# Patient Record
Sex: Female | Born: 1985 | Race: White | Hispanic: No | Marital: Single | Smoking: Former smoker
Health system: Southern US, Community
[De-identification: ages and names within clinical notes are randomized; demographics above are authoritative.]

## PROBLEM LIST (undated history)

## (undated) DIAGNOSIS — F988 Other specified behavioral and emotional disorders with onset usually occurring in childhood and adolescence: Secondary | ICD-10-CM

## (undated) DIAGNOSIS — G43909 Migraine, unspecified, not intractable, without status migrainosus: Secondary | ICD-10-CM

## (undated) DIAGNOSIS — J309 Allergic rhinitis, unspecified: Secondary | ICD-10-CM

## (undated) DIAGNOSIS — Z87442 Personal history of urinary calculi: Secondary | ICD-10-CM

## (undated) DIAGNOSIS — G4700 Insomnia, unspecified: Secondary | ICD-10-CM

## (undated) DIAGNOSIS — I82409 Acute embolism and thrombosis of unspecified deep veins of unspecified lower extremity: Secondary | ICD-10-CM

## (undated) DIAGNOSIS — F3189 Other bipolar disorder: Secondary | ICD-10-CM

## (undated) HISTORY — DX: Allergic rhinitis, unspecified: J30.9

## (undated) HISTORY — PX: WISDOM TOOTH EXTRACTION: SHX21

## (undated) HISTORY — DX: Insomnia, unspecified: G47.00

## (undated) HISTORY — DX: Migraine, unspecified, not intractable, without status migrainosus: G43.909

## (undated) HISTORY — PX: TYMPANOSTOMY TUBE PLACEMENT: SHX32

## (undated) HISTORY — DX: Acute embolism and thrombosis of unspecified deep veins of unspecified lower extremity: I82.409

## (undated) HISTORY — PX: CHOLECYSTECTOMY: SHX55

## (undated) HISTORY — DX: Other specified behavioral and emotional disorders with onset usually occurring in childhood and adolescence: F98.8

## (undated) HISTORY — DX: Other bipolar disorder: F31.89

## (undated) HISTORY — PX: FRENULECTOMY, LINGUAL: SHX1681

## (undated) HISTORY — PX: TONSILLECTOMY: SUR1361

---
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
## 2006-02-20 ENCOUNTER — Other Ambulatory Visit: Admission: RE | Admit: 2006-02-20 | Discharge: 2006-02-20 | Payer: Self-pay | Admitting: Gynecology

## 2006-10-07 ENCOUNTER — Ambulatory Visit: Payer: Self-pay | Admitting: Internal Medicine

## 2007-01-10 ENCOUNTER — Ambulatory Visit: Payer: Self-pay | Admitting: Internal Medicine

## 2007-04-06 ENCOUNTER — Emergency Department (HOSPITAL_COMMUNITY): Admission: EM | Admit: 2007-04-06 | Discharge: 2007-04-06 | Payer: Self-pay | Admitting: Emergency Medicine

## 2007-04-21 ENCOUNTER — Other Ambulatory Visit: Admission: RE | Admit: 2007-04-21 | Discharge: 2007-04-21 | Payer: Self-pay | Admitting: Gynecology

## 2007-07-03 ENCOUNTER — Ambulatory Visit: Payer: Self-pay | Admitting: Internal Medicine

## 2007-07-05 ENCOUNTER — Encounter: Payer: Self-pay | Admitting: Internal Medicine

## 2007-07-05 DIAGNOSIS — G4700 Insomnia, unspecified: Secondary | ICD-10-CM | POA: Insufficient documentation

## 2007-07-05 DIAGNOSIS — F3189 Other bipolar disorder: Secondary | ICD-10-CM | POA: Insufficient documentation

## 2007-07-05 DIAGNOSIS — M545 Low back pain, unspecified: Secondary | ICD-10-CM | POA: Insufficient documentation

## 2007-07-05 DIAGNOSIS — G43909 Migraine, unspecified, not intractable, without status migrainosus: Secondary | ICD-10-CM | POA: Insufficient documentation

## 2007-08-02 ENCOUNTER — Emergency Department (HOSPITAL_COMMUNITY): Admission: EM | Admit: 2007-08-02 | Discharge: 2007-08-02 | Payer: Self-pay | Admitting: Family Medicine

## 2007-09-09 ENCOUNTER — Ambulatory Visit (HOSPITAL_COMMUNITY): Admission: RE | Admit: 2007-09-09 | Discharge: 2007-09-09 | Payer: Self-pay | Admitting: Specialist

## 2007-09-30 ENCOUNTER — Other Ambulatory Visit: Admission: RE | Admit: 2007-09-30 | Discharge: 2007-09-30 | Payer: Self-pay | Admitting: Gynecology

## 2007-10-27 ENCOUNTER — Ambulatory Visit: Payer: Self-pay | Admitting: Internal Medicine

## 2007-10-27 DIAGNOSIS — J019 Acute sinusitis, unspecified: Secondary | ICD-10-CM | POA: Insufficient documentation

## 2007-12-04 ENCOUNTER — Ambulatory Visit: Payer: Self-pay | Admitting: Internal Medicine

## 2007-12-04 DIAGNOSIS — K6289 Other specified diseases of anus and rectum: Secondary | ICD-10-CM | POA: Insufficient documentation

## 2007-12-04 LAB — CONVERTED CEMR LAB
ALT: 15 units/L (ref 0–35)
AST: 17 units/L (ref 0–37)
Albumin: 3.6 g/dL (ref 3.5–5.2)
Alkaline Phosphatase: 43 units/L (ref 39–117)
BUN: 6 mg/dL (ref 6–23)
Basophils Absolute: 0 10*3/uL (ref 0.0–0.1)
Basophils Relative: 0.8 % (ref 0.0–1.0)
Bilirubin, Direct: 0.1 mg/dL (ref 0.0–0.3)
CO2: 27 meq/L (ref 19–32)
Calcium: 8.8 mg/dL (ref 8.4–10.5)
Chloride: 106 meq/L (ref 96–112)
Creatinine, Ser: 0.7 mg/dL (ref 0.4–1.2)
Eosinophils Absolute: 0.1 10*3/uL (ref 0.0–0.6)
Eosinophils Relative: 1.2 % (ref 0.0–5.0)
GFR calc Af Amer: 136 mL/min
GFR calc non Af Amer: 112 mL/min
Glucose, Bld: 95 mg/dL (ref 70–99)
HCT: 36.7 % (ref 36.0–46.0)
Hemoglobin: 12.2 g/dL (ref 12.0–15.0)
Lymphocytes Relative: 48.2 % — ABNORMAL HIGH (ref 12.0–46.0)
MCHC: 33.2 g/dL (ref 30.0–36.0)
MCV: 92 fL (ref 78.0–100.0)
Monocytes Absolute: 0.5 10*3/uL (ref 0.2–0.7)
Monocytes Relative: 8.8 % (ref 3.0–11.0)
Neutro Abs: 2.3 10*3/uL (ref 1.4–7.7)
Neutrophils Relative %: 41 % — ABNORMAL LOW (ref 43.0–77.0)
Platelets: 244 10*3/uL (ref 150–400)
Potassium: 4 meq/L (ref 3.5–5.1)
RBC: 3.99 M/uL (ref 3.87–5.11)
RDW: 11.1 % — ABNORMAL LOW (ref 11.5–14.6)
Sodium: 138 meq/L (ref 135–145)
Total Bilirubin: 0.9 mg/dL (ref 0.3–1.2)
Total Protein: 7 g/dL (ref 6.0–8.3)
WBC: 5.5 10*3/uL (ref 4.5–10.5)

## 2007-12-15 ENCOUNTER — Ambulatory Visit: Payer: Self-pay | Admitting: Internal Medicine

## 2007-12-15 LAB — CONVERTED CEMR LAB: H Pylori IgG: NEGATIVE

## 2007-12-23 ENCOUNTER — Ambulatory Visit (HOSPITAL_COMMUNITY): Admission: RE | Admit: 2007-12-23 | Discharge: 2007-12-23 | Payer: Self-pay | Admitting: Internal Medicine

## 2008-01-02 ENCOUNTER — Ambulatory Visit: Payer: Self-pay | Admitting: Internal Medicine

## 2008-01-08 ENCOUNTER — Ambulatory Visit: Payer: Self-pay | Admitting: Internal Medicine

## 2008-01-08 ENCOUNTER — Encounter: Payer: Self-pay | Admitting: Internal Medicine

## 2008-02-28 ENCOUNTER — Emergency Department (HOSPITAL_COMMUNITY): Admission: EM | Admit: 2008-02-28 | Discharge: 2008-02-28 | Payer: Self-pay | Admitting: Family Medicine

## 2008-04-12 ENCOUNTER — Ambulatory Visit: Payer: Self-pay | Admitting: Internal Medicine

## 2008-05-10 DIAGNOSIS — K648 Other hemorrhoids: Secondary | ICD-10-CM | POA: Insufficient documentation

## 2008-05-12 ENCOUNTER — Ambulatory Visit: Payer: Self-pay | Admitting: Internal Medicine

## 2008-05-12 DIAGNOSIS — R1011 Right upper quadrant pain: Secondary | ICD-10-CM | POA: Insufficient documentation

## 2008-05-18 ENCOUNTER — Ambulatory Visit (HOSPITAL_COMMUNITY): Admission: RE | Admit: 2008-05-18 | Discharge: 2008-05-18 | Payer: Self-pay | Admitting: Internal Medicine

## 2008-05-20 ENCOUNTER — Encounter: Payer: Self-pay | Admitting: Internal Medicine

## 2008-05-25 ENCOUNTER — Telehealth: Payer: Self-pay | Admitting: Internal Medicine

## 2008-06-07 ENCOUNTER — Encounter: Payer: Self-pay | Admitting: Internal Medicine

## 2008-07-16 ENCOUNTER — Ambulatory Visit: Payer: Self-pay | Admitting: Internal Medicine

## 2008-07-16 DIAGNOSIS — J069 Acute upper respiratory infection, unspecified: Secondary | ICD-10-CM | POA: Insufficient documentation

## 2008-07-28 ENCOUNTER — Encounter: Payer: Self-pay | Admitting: Internal Medicine

## 2008-11-15 ENCOUNTER — Telehealth: Payer: Self-pay | Admitting: Internal Medicine

## 2008-11-16 DIAGNOSIS — R110 Nausea: Secondary | ICD-10-CM | POA: Insufficient documentation

## 2008-11-17 ENCOUNTER — Ambulatory Visit: Payer: Self-pay | Admitting: Internal Medicine

## 2008-11-17 LAB — CONVERTED CEMR LAB: Tissue Transglutaminase Ab, IgA: 0.5 units (ref <7)

## 2008-11-23 ENCOUNTER — Ambulatory Visit: Payer: Self-pay | Admitting: Internal Medicine

## 2008-12-20 ENCOUNTER — Emergency Department (HOSPITAL_COMMUNITY): Admission: EM | Admit: 2008-12-20 | Discharge: 2008-12-21 | Payer: Self-pay | Admitting: Emergency Medicine

## 2008-12-21 ENCOUNTER — Telehealth: Payer: Self-pay | Admitting: Internal Medicine

## 2008-12-21 ENCOUNTER — Encounter: Payer: Self-pay | Admitting: Internal Medicine

## 2008-12-22 ENCOUNTER — Ambulatory Visit (HOSPITAL_COMMUNITY): Admission: RE | Admit: 2008-12-22 | Discharge: 2008-12-22 | Payer: Self-pay | Admitting: Internal Medicine

## 2009-02-08 ENCOUNTER — Ambulatory Visit: Payer: Self-pay | Admitting: Internal Medicine

## 2009-03-31 ENCOUNTER — Telehealth: Payer: Self-pay | Admitting: Internal Medicine

## 2009-06-03 ENCOUNTER — Ambulatory Visit (HOSPITAL_COMMUNITY): Admission: RE | Admit: 2009-06-03 | Discharge: 2009-06-03 | Payer: Self-pay | Admitting: Orthopedic Surgery

## 2009-06-30 ENCOUNTER — Ambulatory Visit (HOSPITAL_COMMUNITY): Admission: RE | Admit: 2009-06-30 | Discharge: 2009-06-30 | Payer: Self-pay | Admitting: Orthopedic Surgery

## 2009-08-14 ENCOUNTER — Emergency Department (HOSPITAL_COMMUNITY): Admission: EM | Admit: 2009-08-14 | Discharge: 2009-08-14 | Payer: Self-pay | Admitting: Emergency Medicine

## 2009-08-14 ENCOUNTER — Encounter (INDEPENDENT_AMBULATORY_CARE_PROVIDER_SITE_OTHER): Payer: Self-pay | Admitting: *Deleted

## 2009-08-15 ENCOUNTER — Telehealth: Payer: Self-pay | Admitting: Internal Medicine

## 2009-08-24 ENCOUNTER — Ambulatory Visit: Payer: Self-pay | Admitting: Internal Medicine

## 2009-08-26 ENCOUNTER — Encounter: Payer: Self-pay | Admitting: Internal Medicine

## 2009-09-07 HISTORY — PX: OTHER SURGICAL HISTORY: SHX169

## 2009-09-28 ENCOUNTER — Ambulatory Visit (HOSPITAL_COMMUNITY): Admission: RE | Admit: 2009-09-28 | Discharge: 2009-09-28 | Payer: Self-pay | Admitting: Orthopedic Surgery

## 2009-10-05 ENCOUNTER — Encounter (INDEPENDENT_AMBULATORY_CARE_PROVIDER_SITE_OTHER): Payer: Self-pay | Admitting: Surgery

## 2009-10-05 ENCOUNTER — Observation Stay (HOSPITAL_COMMUNITY): Admission: RE | Admit: 2009-10-05 | Discharge: 2009-10-06 | Payer: Self-pay | Admitting: Surgery

## 2009-10-21 ENCOUNTER — Encounter: Payer: Self-pay | Admitting: Internal Medicine

## 2009-10-24 ENCOUNTER — Ambulatory Visit: Payer: Self-pay | Admitting: Internal Medicine

## 2010-01-23 ENCOUNTER — Encounter: Payer: Self-pay | Admitting: Internal Medicine

## 2010-02-16 ENCOUNTER — Encounter: Admission: RE | Admit: 2010-02-16 | Discharge: 2010-02-16 | Payer: Self-pay | Admitting: Internal Medicine

## 2010-10-21 ENCOUNTER — Emergency Department (HOSPITAL_COMMUNITY)
Admission: EM | Admit: 2010-10-21 | Discharge: 2010-10-21 | Payer: Self-pay | Source: Home / Self Care | Admitting: Emergency Medicine

## 2010-11-09 NOTE — Assessment & Plan Note (Signed)
Summary: COUGH/ CONGESTION /NWS  #   Vital Signs:  Patient profile:   25 year old female Height:      65 inches Weight:      149 pounds BMI:     24.88 O2 Sat:      98 % on Room air Temp:     98.5 degrees F oral Pulse rate:   92 / minute BP sitting:   112 / 74  (left arm) Cuff size:   regular  Vitals Entered ByZella Ball Ewing (October 24, 2009 1:44 PM)  O2 Flow:  Room air CC: cough,congestion/RE   Primary Care Provider:  Oliver Barre, MD   CC:  cough and congestion/RE.  History of Present Illness: here with acute osnet 3 days mild to mod facial pain, pressure, fever and greenish d/c; no cough and Pt denies CP, sob, doe, wheezing, orthopnea, pnd, worsening LE edema, palps, dizziness or syncope   Also with incresed stress and difficulty getting to sleep , not much trouble with staying asleep.  No significnat inreased depressive symtpoms, suicidal ideation, or panic.  Doing well on current meds per pt with functioning well at work and social.  No high risk behevaior.   Problems Prior to Update: 1)  Sinusitis- Acute-nos  (ICD-461.9) 2)  Nausea  (ICD-787.02) 3)  Insomnia-sleep Disorder-unspec  (ICD-780.52) 4)  Uri  (ICD-465.9) 5)  Ruq Pain  (ICD-789.01) 6)  Hemorrhoids, Internal  (ICD-455.0) 7)  Preventive Health Care  (ICD-V70.0) 8)  Rectal Pain  (ICD-569.42) 9)  Sinusitis- Acute-nos  (ICD-461.9) 10)  Family History Diabetes 1st Degree Relative  (ICD-V18.0) 11)  Family History Depression  (ICD-V17.0) 12)  Low Back Pain  (ICD-724.2) 13)  Migraine Headache  (ICD-346.90) 14)  Insomnia  (ICD-780.52) 15)  Disorder, Bipolar Nec  (ICD-296.89)  Medications Prior to Update: 1)  Nuvaring 0.12-0.015 Mg/24hr  Ring (Etonogestrel-Ethinyl Estradiol) .... Use Asd 1 Q Mo 2)  Zolpidem Tartrate 10 Mg Tabs (Zolpidem Tartrate) .Marland Kitchen.. 1 By Mouth At Bedtime As Needed 3)  Sumatriptan Succinate 100 Mg Tabs (Sumatriptan Succinate) .Marland Kitchen.. 1 By Mouth As Needed For Migraine 4)  Ibuprofen 800 Mg Tabs  (Ibuprofen) .Marland Kitchen.. 1 By Mouth Q 6 Hrs As Needed 5)  Oxycodone Hcl 5 Mg Tabs (Oxycodone Hcl) .Marland Kitchen.. 1 Every 4-6 Hours As Needed 6)  Promethazine Hcl 25 Mg Tabs (Promethazine Hcl) .Marland Kitchen.. 1 By Mouth 4-6 Hours As Needed 7)  Womans One - A -Day .Marland Kitchen.. 1 By Mouth Once Daily 8)  Dilaudid 2 Mg Tabs (Hydromorphone Hcl) .... Take 1 Tablet By Mouth As Needed For Severe Pain.  Current Medications (verified): 1)  Nuvaring 0.12-0.015 Mg/24hr  Ring (Etonogestrel-Ethinyl Estradiol) .... Use Asd 1 Q Mo 2)  Zolpidem Tartrate 10 Mg Tabs (Zolpidem Tartrate) .Marland Kitchen.. 1 By Mouth At Bedtime As Needed 3)  Sumatriptan Succinate 100 Mg Tabs (Sumatriptan Succinate) .Marland Kitchen.. 1 By Mouth As Needed For Migraine 4)  Ibuprofen 800 Mg Tabs (Ibuprofen) .Marland Kitchen.. 1 By Mouth Q 6 Hrs As Needed 5)  Womans One - A -Day .Marland Kitchen.. 1 By Mouth Once Daily 6)  Clarithromycin 500 Mg Tabs (Clarithromycin) .Marland Kitchen.. 1po Two Times A Day 7)  Hydrocodone-Homatropine 5-1.5 Mg/62ml Syrp (Hydrocodone-Homatropine) .Marland Kitchen.. 1 Tsp By Mouth Q 6 Hrs As Needed Cough  Allergies (verified): No Known Drug Allergies  Past History:  Past Medical History: Last updated: 08/24/2009 Migraine Headache Low back pain HEMORRHOIDS, INTERNAL (ICD-455.0) PREVENTIVE HEALTH CARE (ICD-V70.0) RECTAL PAIN (ICD-569.42) SINUSITIS- ACUTE-NOS (ICD-461.9) FAMILY HISTORY DIABETES 1ST DEGREE RELATIVE (ICD-V18.0) FAMILY HISTORY  DEPRESSION (ICD-V17.0) LOW BACK PAIN (ICD-724.2) MIGRAINE HEADACHE (ICD-346.90) INSOMNIA (ICD-780.52) DISORDER, BIPOLAR NEC (ICD-296.89)  Social History: Last updated: 05/10/2008 Former Smoker Alcohol use-yes-on occasion Single no children med surg program at [employer]  Risk Factors: Smoking Status: quit (10/27/2007)  Past Surgical History: Frenulectomy of the base of the tongue Tubes in ears as child Wisdom Teeth Extraction s/p left hip labral tear surgury dec 2010 Cholecystectomy  Review of Systems       all otherwise negative per pt -  Physical Exam  General:   alert and well-developed.  , mild ill  Head:  normocephalic and atraumatic.   Eyes:  vision grossly intact, pupils equal, and pupils round.   Ears:  bilat tm's red, sinus tender bilat Nose:  nasal dischargemucosal pallor and mucosal erythema.   Mouth:  pharyngeal erythema and fair dentition.   Neck:  supple and cervical lymphadenopathy.   Lungs:  normal respiratory effort and normal breath sounds.   Heart:  normal rate and regular rhythm.   Extremities:  no edema, no erythema  Psych:  not anxious appearing and not depressed appearing.     Impression & Recommendations:  Problem # 1:  SINUSITIS- ACUTE-NOS (ICD-461.9)  Her updated medication list for this problem includes:    Clarithromycin 500 Mg Tabs (Clarithromycin) .Marland Kitchen... 1po two times a day    Hydrocodone-homatropine 5-1.5 Mg/99ml Syrp (Hydrocodone-homatropine) .Marland Kitchen... 1 tsp by mouth q 6 hrs as needed cough treat as above, f/u any worsening signs or symptoms   Problem # 2:  INSOMNIA (ICD-780.52)  Her updated medication list for this problem includes:    Zolpidem Tartrate 10 Mg Tabs (Zolpidem tartrate) .Marland Kitchen... 1 by mouth at bedtime as needed treat as above, f/u any worsening signs or symptoms   Problem # 3:  DISORDER, BIPOLAR NEC (ICD-296.89) stable overall by hx and exam, ok to continue meds/tx as is - declines meds at this time  Complete Medication List: 1)  Nuvaring 0.12-0.015 Mg/24hr Ring (Etonogestrel-ethinyl estradiol) .... Use asd 1 q mo 2)  Zolpidem Tartrate 10 Mg Tabs (Zolpidem tartrate) .Marland Kitchen.. 1 by mouth at bedtime as needed 3)  Sumatriptan Succinate 100 Mg Tabs (Sumatriptan succinate) .Marland Kitchen.. 1 by mouth as needed for migraine 4)  Ibuprofen 800 Mg Tabs (Ibuprofen) .Marland Kitchen.. 1 by mouth q 6 hrs as needed 5)  Womans One - A -day  .Marland KitchenMarland Kitchen. 1 by mouth once daily 6)  Clarithromycin 500 Mg Tabs (Clarithromycin) .Marland Kitchen.. 1po two times a day 7)  Hydrocodone-homatropine 5-1.5 Mg/68ml Syrp (Hydrocodone-homatropine) .Marland Kitchen.. 1 tsp by mouth q 6 hrs as needed  cough  Patient Instructions: 1)  Please take all new medications as prescribed 2)  Continue all previous medications as before this visit  3)  Please schedule a follow-up appointment as needed. Prescriptions: HYDROCODONE-HOMATROPINE 5-1.5 MG/5ML SYRP (HYDROCODONE-HOMATROPINE) 1 tsp by mouth q 6 hrs as needed cough  #6 oz x 1   Entered and Authorized by:   Corwin Levins MD   Signed by:   Corwin Levins MD on 10/24/2009   Method used:   Print then Give to Patient   RxID:   [id number] CLARITHROMYCIN 500 MG TABS (CLARITHROMYCIN) 1po two times a day  #20 x 0   Entered and Authorized by:   Corwin Levins MD   Signed by:   Corwin Levins MD on 10/24/2009   Method used:   Print then Give to Patient   RxID:   [id number] ZOLPIDEM TARTRATE 10 MG TABS (ZOLPIDEM TARTRATE) 1 by  mouth at bedtime as needed  #60 x 3   Entered and Authorized by:   Corwin Levins MD   Signed by:   Corwin Levins MD on 10/24/2009   Method used:   Print then Give to Patient   RxID:   [id number]

## 2010-11-09 NOTE — Letter (Signed)
Summary: [facility] Gonzalez [facility] / [facility]  Lauren [facility] / [facility]   Imported By: Lennie Odor 01/26/2010 16:12:03  _____________________________________________________________________  External Attachment:    Type:   Image     Comment:   External Document

## 2010-11-09 NOTE — Letter (Signed)
Summary: [facility]   Imported By: Lester Shelby 10/31/2009 13:52:50  _____________________________________________________________________  External Attachment:    Type:   Image     Comment:   External Document

## 2010-11-18 ENCOUNTER — Inpatient Hospital Stay (INDEPENDENT_AMBULATORY_CARE_PROVIDER_SITE_OTHER)
Admission: RE | Admit: 2010-11-18 | Discharge: 2010-11-18 | Disposition: A | Discharge status: Home / Self Care | Payer: 59 | Source: Ambulatory Visit | Attending: Family Medicine | Admitting: Family Medicine

## 2010-11-18 DIAGNOSIS — T169XXA Foreign body in ear, unspecified ear, initial encounter: Secondary | ICD-10-CM

## 2011-01-08 LAB — DIFFERENTIAL
Basophils Absolute: 0 10*3/uL (ref 0.0–0.1)
Basophils Relative: 1 % (ref 0–1)
Eosinophils Relative: 2 % (ref 0–5)
Lymphocytes Relative: 51 % — ABNORMAL HIGH (ref 12–46)
Monocytes Absolute: 0.5 10*3/uL (ref 0.1–1.0)

## 2011-01-08 LAB — COMPREHENSIVE METABOLIC PANEL
AST: 39 U/L — ABNORMAL HIGH (ref 0–37)
Alkaline Phosphatase: 47 U/L (ref 39–117)
BUN: 7 mg/dL (ref 6–23)
CO2: 23 mEq/L (ref 19–32)
Chloride: 107 mEq/L (ref 96–112)
Creatinine, Ser: 0.72 mg/dL (ref 0.4–1.2)
GFR calc Af Amer: >60 mL/min (ref >60)
GFR calc non Af Amer: >60 mL/min (ref >60)
Potassium: 4.8 mEq/L (ref 3.5–5.1)
Total Bilirubin: 1 mg/dL (ref 0.3–1.2)

## 2011-01-08 LAB — CBC
HCT: 39.2 % (ref 36.0–46.0)
MCV: 91.1 fL (ref 78.0–100.0)
RBC: 4.3 MIL/uL (ref 3.87–5.11)
WBC: 6.1 10*3/uL (ref 4.0–10.5)

## 2011-01-10 LAB — URINALYSIS, ROUTINE W REFLEX MICROSCOPIC
Bilirubin Urine: NEGATIVE
Glucose, UA: NEGATIVE mg/dL
Ketones, ur: NEGATIVE mg/dL
Nitrite: NEGATIVE
Specific Gravity, Urine: 1.031 — ABNORMAL HIGH (ref 1.005–1.030)
pH: 6 (ref 5.0–8.0)

## 2011-01-10 LAB — COMPREHENSIVE METABOLIC PANEL
ALT: 16 U/L (ref 0–35)
AST: 21 U/L (ref 0–37)
Albumin: 3.9 g/dL (ref 3.5–5.2)
Alkaline Phosphatase: 54 U/L (ref 39–117)
BUN: 9 mg/dL (ref 6–23)
Chloride: 96 mEq/L (ref 96–112)
GFR calc Af Amer: >60 mL/min (ref >60)
Potassium: 3.4 mEq/L — ABNORMAL LOW (ref 3.5–5.1)
Sodium: 134 mEq/L — ABNORMAL LOW (ref 135–145)
Total Bilirubin: 1.2 mg/dL (ref 0.3–1.2)
Total Protein: 7.6 g/dL (ref 6.0–8.3)

## 2011-01-10 LAB — DIFFERENTIAL
Eosinophils Absolute: 0 10*3/uL (ref 0.0–0.7)
Eosinophils Relative: 1 % (ref 0–5)
Lymphocytes Relative: 45 % (ref 12–46)
Lymphs Abs: 4.3 10*3/uL — ABNORMAL HIGH (ref 0.7–4.0)
Monocytes Relative: 5 % (ref 3–12)

## 2011-01-10 LAB — URINE MICROSCOPIC-ADD ON

## 2011-01-10 LAB — CBC
HCT: 36.5 % (ref 36.0–46.0)
Platelets: 284 10*3/uL (ref 150–400)
RDW: 12.1 % (ref 11.5–15.5)
WBC: 9.6 10*3/uL (ref 4.0–10.5)

## 2011-01-10 LAB — PREGNANCY, URINE: Preg Test, Ur: NEGATIVE

## 2011-01-18 LAB — COMPREHENSIVE METABOLIC PANEL
ALT: 18 U/L (ref 0–35)
AST: 25 U/L (ref 0–37)
Albumin: 4 g/dL (ref 3.5–5.2)
Alkaline Phosphatase: 53 U/L (ref 39–117)
BUN: 9 mg/dL (ref 6–23)
Chloride: 105 mEq/L (ref 96–112)
GFR calc Af Amer: >60 mL/min (ref >60)
Potassium: 3.6 mEq/L (ref 3.5–5.1)
Sodium: 137 mEq/L (ref 135–145)
Total Bilirubin: 0.9 mg/dL (ref 0.3–1.2)
Total Protein: 7.6 g/dL (ref 6.0–8.3)

## 2011-01-18 LAB — DIFFERENTIAL
Basophils Relative: 1 % (ref 0–1)
Eosinophils Relative: 1 % (ref 0–5)
Monocytes Absolute: 0.4 10*3/uL (ref 0.1–1.0)
Monocytes Relative: 5 % (ref 3–12)
Neutro Abs: 3.7 10*3/uL (ref 1.7–7.7)

## 2011-01-18 LAB — URINALYSIS, ROUTINE W REFLEX MICROSCOPIC
Bilirubin Urine: NEGATIVE
Ketones, ur: NEGATIVE mg/dL
Nitrite: NEGATIVE
Specific Gravity, Urine: 1.023 (ref 1.005–1.030)
pH: 6 (ref 5.0–8.0)

## 2011-01-18 LAB — CBC
Platelets: 258 10*3/uL (ref 150–400)
RDW: 12.3 % (ref 11.5–15.5)
WBC: 7.8 10*3/uL (ref 4.0–10.5)

## 2011-01-18 LAB — AMYLASE: Amylase: 70 U/L (ref 27–131)

## 2011-01-18 LAB — URINE MICROSCOPIC-ADD ON

## 2011-01-18 LAB — POCT PREGNANCY, URINE: Preg Test, Ur: NEGATIVE

## 2011-02-20 NOTE — Assessment & Plan Note (Signed)
[facility]                         GASTROENTEROLOGY OFFICE NOTE   NAME:Gonzalez, Lauren KOEHNE                       MRN:          [id number]  DATE:12/15/2007                            DOB:          [birth year]    REFERRING PHYSICIAN:  Corwin Levins, MD   Lauren Gonzalez is a 25 year old white female referred by Dr. Jonny Ruiz for two  issues:  1. Dyspepsia, belching and bloating.  2. Rectal pain after having a bowel movement associated with rectal      bleeding.  She has been having lower abdominal discomfort for about 2 weeks, which  has been relieved by bowel movements but unfortunately associated with  rectal pain.  She developed severe sickening feeling in the rectum after  bowel movement with burning to where she cannot even sit.  If she has to  have another bowel movement, it aggravates the pain even more.  She has  seen some bright red blood per rectum.  She denies being constipated.  She has a family history of colon polyps as well as hemorrhoids in her  father.  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  She feels that some of  her rectal problems may be related to heavy straining.  Her weight has  been stable between 135 and 145 pounds.   As far as dyspepsia is concerned, she does have a family history of  gallstones in her mother who had a cholecystectomy.   MEDICATIONS:  1. NuvaRing.  2. Anusol Hc suppository which she really has not taken.  3. Ambien 5 mg p.r.n.   PAST MEDICAL HISTORY:  1. Significant for allergies.  2. Chronic headaches.  3. Operations wisdom teeth extraction.  4. She had tubes in her ears.   FAMILY HISTORY:  Positive for colon polyps in her father.  Hiatal hernia  in her father who also had [employer] fundoplication.  She lives with her parents.  She is now in school for radiology  technician.  She does not smoke and drinks alcohol only occasionally.   REVIEW OF  SYSTEMS:  Weight stable.  Occasional fever.  __________  complaints of sleeping problems.  She had swollen lymph glands during a  recent upper respiratory infection.   PHYSICAL EXAMINATION:  Blood pressure 126/72.  Pulse 88.  Weight 144  pounds.  She was somewhat anxious, talkative, voicing a lot of  complaints concerning her symptoms.  LUNGS:  Clear to auscultation.  NECK:  Supple.  No adenopathy.  Thyroid was not enlarged.  CORE:  With no muscle and no mass to abdomen but soft, somewhat tender  in the epigastrium.  Right lower quadrant was normal.  Liver edge was  grosslymargin.   On rectal anoscopic exam, there was a fibrotic skin tag at the 9  o'clock, which when followed showed an anal fissure at the side of the  dentate line.  There was no bleeding.  It was a deep invagination of the  dentate line but no active bleeding was noticed.  There were also first  grade internal hemorrhoids, circumferentially.  Stool itself was  Hemoccult negative.   IMPRESSION:  51. A 25 year old white female with no specific dyspepsia.  Rule out      biliary problems.  Rule out gastroesophageal reflux.  Rule out      irritable bowel syndrome or H. Pylori gastritis.  2. Rectal pain with bleeding most likely consistent with anal fissure.      Also, patient is noted to have first grade internal hemorrhoids.  3. Anxiety.   PLAN:  1. Prilosec 20 mg p.o. q. d.  2. Helicobacter Pylori antibody will be checked today.  3. __________ abdominal ultrasound with attention to the gallbladder.  4. Treatment over the anal fissure using Anusol Hc suppositories      q.h.s. and an anal cream 2.5% to use t.i.d. and p.r.n.  I would      like to see her again in 8 weeks to reevaluate for symptoms.     Hedwig Morton. Juanda Chance, MD  Electronically Signed    DMB/MedQ  DD: 12/15/2007  DT: 12/16/2007  Job #: [id number]   cc:   Corwin Levins, MD

## 2011-05-31 ENCOUNTER — Encounter: Payer: Self-pay | Admitting: Internal Medicine

## 2011-05-31 DIAGNOSIS — Z0001 Encounter for general adult medical examination with abnormal findings: Secondary | ICD-10-CM | POA: Insufficient documentation

## 2011-06-01 ENCOUNTER — Ambulatory Visit (INDEPENDENT_AMBULATORY_CARE_PROVIDER_SITE_OTHER): Payer: 59 | Admitting: Internal Medicine

## 2011-06-01 ENCOUNTER — Encounter: Payer: Self-pay | Admitting: Internal Medicine

## 2011-06-01 VITALS — BP 112/64 | HR 102 | Temp 98.8°F | Ht 65.0 in | Wt 151.0 lb

## 2011-06-01 DIAGNOSIS — G43909 Migraine, unspecified, not intractable, without status migrainosus: Secondary | ICD-10-CM

## 2011-06-01 MED ORDER — TOPIRAMATE 50 MG PO TABS: 50.0000 mg | ORAL_TABLET | Freq: Two times a day (BID) | ORAL | Status: DC

## 2011-06-01 MED ORDER — SUMATRIPTAN SUCCINATE 100 MG PO TABS: 100.0000 mg | ORAL_TABLET | ORAL | Status: DC | PRN

## 2011-06-01 MED ORDER — ZOLPIDEM TARTRATE 10 MG PO TABS: 10.0000 mg | ORAL_TABLET | Freq: Every evening | ORAL | Status: DC | PRN

## 2011-06-01 MED ORDER — IBUPROFEN 800 MG PO TABS: 800.0000 mg | ORAL_TABLET | Freq: Four times a day (QID) | ORAL | Status: DC | PRN

## 2011-06-01 NOTE — Progress Notes (Signed)
Subjective:    Patient ID: Lauren Gonzalez, female    DOB: 1986/01/05, 24 y.o.   MRN: [id number]  HPI Here to f/u;  Overall doing ok, but unfortunately with increased migraine freq and severity for over 6 months;  Works as IT sales professional, pt state the OR light, instruments and music seem make the freq and severity worse over the past yr as well as menstraul related similar headaches, has left work early for home to sleep on mult occasions - currently on "corrective action" for approx 5 times in the past 3 Mo, also with several days of not being able to come into work due to AM migraine. Job is stressfull, mainly the environment, much lesser personality issues.  No other overt triggers.  Pt denies chest pain, increased sob or doe, wheezing, orthopnea, PND, increased LE swelling, palpitations, dizziness or syncope.  Pt denies new neurological symptoms such as new headache, or facial or extremity weakness or numbness except for the above.   Pt denies polydipsia, polyuria.  Overall good compliance with treatment, and good medicine tolerability, but ibuprofen and imitrex not working as well lately.  Denies worsening depressive symptoms, suicidal ideation, or panic, though has ongoing anxiety/bipolar Past Medical History  Diagnosis Date  . DISORDER, BIPOLAR NEC 07/05/2007  . HEMORRHOIDS, INTERNAL 05/10/2008  . Insomnia, unspecified 07/05/2007  . LOW BACK PAIN 07/05/2007  . MIGRAINE HEADACHE 07/05/2007  . NAUSEA 11/16/2008  . RECTAL PAIN 12/04/2007  . RUQ PAIN 05/12/2008  . SINUSITIS- ACUTE-NOS 10/27/2007  . URI 07/16/2008   Past Surgical History  Procedure Date  . Frenulectomy, lingual     of the base of the tongue  . Tympanostomy tube placement     as a child  . Wisdom tooth extraction   . S/p left hip labral tear surgury dec. 2010  . Cholecystectomy     reports that she has quit smoking. She does not have any smokeless tobacco history on file. She reports that she drinks alcohol. Her drug history not on  file. family history includes Arthritis in her other; Cancer in her other; Colon polyps in her father; Depression in her other; Diabetes in her other; Heart disease in her other; and Stroke in her other. No Known Allergies Current Outpatient Prescriptions on File Prior to Visit  Medication Sig Dispense Refill  . etonogestrel-ethinyl estradiol (NUVARING) 0.12-0.015 MG/24HR vaginal ring Place 1 each vaginally every 28 (twenty-eight) days. Insert vaginally and leave in place for 3 consecutive weeks, then remove for 1 week.        Review of Systems Review of Systems  Constitutional: Negative for diaphoresis and unexpected weight change.  HENT: Negative for drooling and tinnitus.   Eyes: Negative for photophobia and visual disturbance.  Respiratory: Negative for choking and stridor.   Gastrointestinal: Negative for vomiting and blood in stool.  Genitourinary: Negative for hematuria and decreased urine volume.  Musculoskeletal: Negative for gait problem.      Objective:   Physical Exam BP 112/64  Pulse 102  Temp(Src) 98.8 F (37.1 C) (Oral)  Ht 5\' 5"  (1.651 m)  Wt 151 lb (68.493 kg)  BMI 25.13 kg/m2  SpO2 98% Physical Exam  VS noted Constitutional: Pt appears well-developed and well-nourished.  HENT: Head: Normocephalic.  Right Ear: External ear normal.  Left Ear: External ear normal.  Eyes: Conjunctivae and EOM are normal. Pupils are equal, round, and reactive to light.  Neck: Normal range of motion. Neck supple.  Cardiovascular: Normal rate and regular rhythm.  Pulmonary/Chest: Effort normal and breath sounds normal.  Abd:  Soft, NT, non-distended, + BS Neurological: Pt is alert. No cranial nerve deficit. motor/sens/dtr/gait intact Skin: Skin is warm. No erythema.  Psychiatric: Pt behavior is normal. Thought content normal. 1+ nervous        Assessment & Plan:

## 2011-06-01 NOTE — Assessment & Plan Note (Signed)
Mixed stress related it seems, OR environment, and hormonal/menses - to cont the ibuprofen/imitrex prn, as well as start prophylaxis with topamax asd;  FMLA form to be filled out as well;  Also refer [facility]

## 2011-06-01 NOTE — Patient Instructions (Signed)
Take all new medications as prescribed The topamax is begun as 1/2 pill per night for 3 nights, then 1/2 pill twice per day for 3 days, then 1/2 in the AM and 1 in th PM for 3 days, then one pill twice per day after that Continue all other medications as before You will be contacted regarding the referral for: [facility] wellness center Please return on Monday to pick up the FMLA form

## 2011-06-03 ENCOUNTER — Encounter: Payer: Self-pay | Admitting: Internal Medicine

## 2011-06-04 DIAGNOSIS — Z0279 Encounter for issue of other medical certificate: Secondary | ICD-10-CM

## 2011-06-13 ENCOUNTER — Telehealth: Payer: Self-pay

## 2011-06-13 NOTE — Telephone Encounter (Signed)
noted 

## 2011-06-13 NOTE — Telephone Encounter (Signed)
Pt called to inform MD that she has decided to stop taking Topamax due to foggy headedness, numbness and tingling. Pt will wait for appt with [facility] wellness clinic

## 2011-06-22 ENCOUNTER — Telehealth: Payer: Self-pay | Admitting: *Deleted

## 2011-06-22 NOTE — Telephone Encounter (Signed)
Ok with me - can robin do this (can wait until Mon)

## 2011-06-22 NOTE — Telephone Encounter (Signed)
Pt left vm stating saw md concerning FMLA forms. Her HR personel is wanting md to back date to 05/09/11. Can write a letter and fax to HR.

## 2011-06-25 NOTE — Telephone Encounter (Signed)
Patient left message stating letter should state migraines started May 09, 2011, [employer] HR fax number [phone]. Left message for the patient to call back as need to speak to patient to complete the letter.

## 2011-06-25 NOTE — Telephone Encounter (Signed)
Called the patient left message to call back 

## 2011-06-26 NOTE — Telephone Encounter (Signed)
Spoke to patient to confirm content of letter, fax number and HR contact name. Completed letter and will fax to HR as patient requested.

## 2011-07-04 LAB — POCT I-STAT, CHEM 8
Calcium, Ion: 1.17
Creatinine, Ser: 0.9
Glucose, Bld: 84
Hemoglobin: 11.2 — ABNORMAL LOW
Potassium: 4.3

## 2011-07-10 ENCOUNTER — Telehealth: Payer: Self-pay

## 2011-07-10 NOTE — Telephone Encounter (Signed)
Completed letter, faxed to number requested and called patient informed letter has been sent.

## 2011-07-10 NOTE — Telephone Encounter (Signed)
We normally do not give notes for work without OV, but ok this time only - to [place]

## 2011-07-10 NOTE — Telephone Encounter (Signed)
Pt called requesting a work note for the flu she has last week, she was out 09/26-09/28 return 07/09/2011. Does pt need OV although she is no longer sick?  Fax to [phone]

## 2011-07-23 ENCOUNTER — Other Ambulatory Visit: Payer: Self-pay | Admitting: Gynecology

## 2011-11-07 ENCOUNTER — Telehealth: Payer: Self-pay

## 2011-11-07 NOTE — Telephone Encounter (Signed)
Patient informed to pick up note at front desk.

## 2011-11-07 NOTE — Telephone Encounter (Signed)
Patient has been out of work since Monday Jan. 28 with a migraine, fever and flu like symptoms. She is returning to work tomorrow Jan. 31, 2013 and would like a work note for these days. Call back number is [phone]

## 2011-11-07 NOTE — Telephone Encounter (Signed)
We normally dont give work note without OV evaluation, but ok this time  Ok for note - to [place]

## 2011-11-29 DIAGNOSIS — Z0279 Encounter for issue of other medical certificate: Secondary | ICD-10-CM

## 2012-01-14 ENCOUNTER — Telehealth: Payer: Self-pay

## 2012-01-14 NOTE — Telephone Encounter (Signed)
Completed letter, called the patient to inform and faxed to [phone] to HR per patient request.

## 2012-01-14 NOTE — Telephone Encounter (Signed)
Ok for all 

## 2012-01-14 NOTE — Telephone Encounter (Signed)
Pt called requesting letter for her employer stating that she was out of work from 03/20 to 04/08 for migraine,  returning on 04/09.  If letter cannot certify her absence from 03/20 - 04/08, can pt have letter stating she is okay to return to work 04/09? Please advise.

## 2012-07-04 ENCOUNTER — Telehealth: Payer: Self-pay | Admitting: Internal Medicine

## 2012-07-04 NOTE — Telephone Encounter (Signed)
Caller: [name]/Patient; Patient Name: Lauren Gonzalez; PCP: Oliver Barre (Adults only); Best Callback Phone Number: [phone].  Patient calling about migraine.  States she has been out of work 07/02/12, 07/03/12, and 07/04/12 and needs a note stating that she needs to be out of work due to migraine and may return 07/07/12.  States has headaches lasting several days, and Dr. Jonny Ruiz is aware of this, and has sent notes to her employer in the past.  States currently has cough and cold symptoms as well.  Per headache protocol, emergent symptoms denied; advised for home care, with callback parameters given.   Secure fax to Oren Binet [phone].   May reach patient at [phone].

## 2012-07-04 NOTE — Telephone Encounter (Signed)
Ok for note this time  Please remind pt, notes for work usually require OV

## 2012-07-04 NOTE — Telephone Encounter (Signed)
Documentation     Oliver Barre, MD 07/04/2012 8:50 AM Signed  Ok for note this time  Please remind pt, notes for work usually require OV

## 2012-07-04 NOTE — Telephone Encounter (Signed)
Patient informed Md instructions. Completed note and faxed to number.

## 2012-07-28 ENCOUNTER — Other Ambulatory Visit: Payer: 59

## 2012-07-28 ENCOUNTER — Encounter: Payer: Self-pay | Admitting: Internal Medicine

## 2012-07-28 ENCOUNTER — Ambulatory Visit (INDEPENDENT_AMBULATORY_CARE_PROVIDER_SITE_OTHER): Payer: 59 | Admitting: Internal Medicine

## 2012-07-28 VITALS — BP 110/72 | HR 96 | Temp 97.3°F | Ht 65.0 in | Wt 150.1 lb

## 2012-07-28 DIAGNOSIS — F3189 Other bipolar disorder: Secondary | ICD-10-CM

## 2012-07-28 DIAGNOSIS — G43909 Migraine, unspecified, not intractable, without status migrainosus: Secondary | ICD-10-CM

## 2012-07-28 DIAGNOSIS — G4700 Insomnia, unspecified: Secondary | ICD-10-CM

## 2012-07-28 DIAGNOSIS — J069 Acute upper respiratory infection, unspecified: Secondary | ICD-10-CM

## 2012-07-28 DIAGNOSIS — Z0000 Encounter for general adult medical examination without abnormal findings: Secondary | ICD-10-CM

## 2012-07-28 DIAGNOSIS — Z23 Encounter for immunization: Secondary | ICD-10-CM

## 2012-07-28 MED ORDER — CLONAZEPAM 0.5 MG PO TABS: 0.5000 mg | ORAL_TABLET | Freq: Two times a day (BID) | ORAL | Status: DC | PRN

## 2012-07-28 MED ORDER — ZOLMITRIPTAN 5 MG NA SOLN: 1.0000 | NASAL | Status: DC | PRN

## 2012-07-28 MED ORDER — AZITHROMYCIN 250 MG PO TABS: ORAL_TABLET | ORAL | Status: DC

## 2012-07-28 MED ORDER — ZOLPIDEM TARTRATE 10 MG PO TABS: 10.0000 mg | ORAL_TABLET | Freq: Every evening | ORAL | Status: DC | PRN

## 2012-07-28 NOTE — Patient Instructions (Addendum)
You had the flu shot today Take all new medications as prescribed Continue all other medications as before Please go to LAB in the Basement for the blood to be done today - the TB test You will be contacted by phone if any changes need to be made immediately.  Otherwise, you will receive a letter about your results with an explanation. Please remember to sign up for My Chart at your earliest convenience, as this will be important to you in the future with finding out test results.

## 2012-07-28 NOTE — Assessment & Plan Note (Signed)
Ok for [employer] refill asd,  to f/u any worsening symptoms or concerns

## 2012-07-28 NOTE — Assessment & Plan Note (Addendum)
stable overall by hx and exam, and pt to continue medical treatment as before Lab Results  Component Value Date   WBC 6.1 09/26/2009   HGB 13.4 09/26/2009   HCT 39.2 09/26/2009   PLT 244 09/26/2009   GLUCOSE 95 09/26/2009   ALT 26 09/26/2009   AST 39* 09/26/2009   NA 139 09/26/2009   K 4.8 09/26/2009   CL 107 09/26/2009   CREATININE 0.72 09/26/2009   BUN 7 09/26/2009   CO2 23 09/26/2009   Declines new labs at this time; does need TB blood test for work purposes

## 2012-07-28 NOTE — Progress Notes (Signed)
Subjective:    Patient ID: Lauren Gonzalez, female    DOB: 11/16/1985, 26 y.o.   MRN: [id number]  HPI   Here with 3 wks acute onset fever, facial pain, pressure, general weakness and malaise, and greenish d/c, with slight ST, but little to no cough and Pt denies chest pain, increased sob or doe, wheezing, orthopnea, PND, increased LE swelling, palpitations, dizziness or syncope.    Pt denies new neurological symptoms such as new headache, or facial or extremity weakness or numbness   Pt denies polydipsia, polyuria.  Does have hx of bipolar with irritability and anger a major element, has been tx per psychiatry with several meds including depakote but did not seem to help the anger and stress.  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  Had used the ambien in the past quite well but now out and very difficult to get to sleep, stay asleep.  No recent racing thoughts or depressive episodes.  Uses Zomig nasal per [facility] quite nicely even at work for her migraine and asks for refill, works better and faster for her.  No worsening severity or freq of HA. Past Medical History  Diagnosis Date  . DISORDER, BIPOLAR NEC 07/05/2007  . HEMORRHOIDS, INTERNAL 05/10/2008  . Insomnia, unspecified 07/05/2007  . LOW BACK PAIN 07/05/2007  . MIGRAINE HEADACHE 07/05/2007  . NAUSEA 11/16/2008  . RECTAL PAIN 12/04/2007  . RUQ PAIN 05/12/2008  . SINUSITIS- ACUTE-NOS 10/27/2007  . URI 07/16/2008  . Insomnia 07/28/2012   Past Surgical History  Procedure Date  . Frenulectomy, lingual     of the base of the tongue  . Tympanostomy tube placement     as a child  . Wisdom tooth extraction   . S/p left hip labral tear surgury dec. 2010  . Cholecystectomy     reports that she has quit smoking. She does not have any smokeless tobacco history on file. She reports that she drinks alcohol. Her drug history not on file. family history includes Arthritis in her  other; Cancer in her other; Colon polyps in her father; Depression in her other; Diabetes in her other; Heart disease in her other; and Stroke in her other. No Known Allergies Current Outpatient Prescriptions on File Prior to Visit  Medication Sig Dispense Refill  . etonogestrel-ethinyl estradiol (NUVARING) 0.12-0.015 MG/24HR vaginal ring Place 1 each vaginally every 28 (twenty-eight) days. Insert vaginally and leave in place for 3 consecutive weeks, then remove for 1 week.       Marland Kitchen ibuprofen (ADVIL,MOTRIN) 800 MG tablet Take 1 tablet (800 mg total) by mouth every 6 (six) hours as needed.  60 tablet  2  . zolmitriptan (ZOMIG) 5 MG nasal solution Place 1 spray into the nose as needed. May repeat once after 2 hours  6 Units  11  . DISCONTD: zolpidem (AMBIEN) 10 MG tablet Take 1 tablet (10 mg total) by mouth at bedtime as needed.  30 tablet  5  . clonazePAM (KLONOPIN) 0.5 MG tablet Take 1 tablet (0.5 mg total) by mouth 2 (two) times daily as needed for anxiety.  60 tablet  2  . SUMAtriptan (IMITREX) 100 MG tablet Take 1 tablet (100 mg total) by mouth every 2 (two) hours as needed.  9 tablet  5  . topiramate (TOPAMAX) 50 MG tablet Take 1 tablet (50 mg total) by mouth 2 (two) times daily.  60  tablet  5   Review of Systems  Constitutional: Negative for diaphoresis and unexpected weight change.  HENT: Negative for tinnitus.   Eyes: Negative for photophobia and visual disturbance.  Respiratory: Negative for choking and stridor.   Gastrointestinal: Negative for vomiting and blood in stool.  Genitourinary: Negative for hematuria and decreased urine volume.  Musculoskeletal: Negative for gait problem.  Skin: Negative for color change and wound.  Neurological: Negative for tremors and numbness.  Psychiatric/Behavioral: Negative for decreased concentration. The patient is not hyperactive.       Objective:   Physical Exam BP 110/72  Pulse 96  Temp 97.3 F (36.3 C) (Oral)  Ht 5\' 5"  (1.651 m)  Wt 150  lb 2 oz (68.096 kg)  BMI 24.98 kg/m2  SpO2 98% Physical Exam  VS noted Constitutional: Pt appears well-developed and well-nourished.  HENT: Head: Normocephalic.  Right Ear: External ear normal.  Left Ear: External ear normal.  Bilat tm's mild erythema.  Sinus nontender.  Pharynx mild erythema  Eyes: Conjunctivae and EOM are normal. Pupils are equal, round, and reactive to light.  Neck: Normal range of motion. Neck supple.  Cardiovascular: Normal rate and regular rhythm.   Pulmonary/Chest: Effort normal and breath sounds normal.  Neurological: Pt is alert. Not confused  Skin: Skin is warm. No erythema.  Psychiatric: Pt behavior is normal. Thought content normal. 1-2+ nervous    Assessment & Plan:

## 2012-07-28 NOTE — Assessment & Plan Note (Signed)
Ok for klonopin bid prn, but should also cont f/u with psychiatry,  to f/u any worsening symptoms or concerns

## 2012-07-28 NOTE — Assessment & Plan Note (Signed)
Mild to mod, for antibx course,  to f/u any worsening symptoms or concerns 

## 2012-07-30 ENCOUNTER — Encounter: Payer: Self-pay | Admitting: Internal Medicine

## 2012-08-13 ENCOUNTER — Encounter: Payer: Self-pay | Admitting: Cardiovascular Disease

## 2012-08-14 ENCOUNTER — Telehealth: Payer: Self-pay | Admitting: Internal Medicine

## 2012-08-14 ENCOUNTER — Ambulatory Visit: Payer: 59 | Admitting: Internal Medicine

## 2012-08-14 NOTE — Telephone Encounter (Signed)
Ok for note, but please let pt know the date was oct 21

## 2012-08-14 NOTE — Telephone Encounter (Signed)
Called the patient to inform.  The patient stated her situation has changed and she no longer needs note and to cancel appointment today with Dr. Jonny Ruiz

## 2012-08-14 NOTE — Telephone Encounter (Signed)
Caller: [place]/Patient; Patient Name: Lauren Gonzalez; PCP: Oliver Barre (Adults only); Best Callback Phone Number: [phone]. Caller reports she was seen in the office on 10/25 for a migraine and needs a note from MD re same for her FMLA paperwork. Caller asking if she can get a note re that visit or does she need to come back in to get note. Caller advised a note will be sent re this question. She can be reached at above #.

## 2012-08-20 ENCOUNTER — Other Ambulatory Visit: Payer: Self-pay | Admitting: Gynecology

## 2012-08-22 ENCOUNTER — Telehealth: Payer: Self-pay | Admitting: Internal Medicine

## 2012-08-22 ENCOUNTER — Ambulatory Visit: Payer: 59 | Admitting: Internal Medicine

## 2012-08-22 ENCOUNTER — Other Ambulatory Visit: Payer: 59

## 2012-08-22 DIAGNOSIS — Z0000 Encounter for general adult medical examination without abnormal findings: Secondary | ICD-10-CM

## 2012-08-22 NOTE — Telephone Encounter (Signed)
Request blood panel for Hep A&B for employer

## 2012-08-22 NOTE — Telephone Encounter (Signed)
Patient- Lauren Gonzalez. DOB 1986/07/24. PCP Michaela Corner is requesting information on her Immunization status.  (1) She wants to know if she needs any update Immunizations for her job. (2) She needs a copy of her Immunizations history for her employer. (3) she would like to schedule an appointment to come in for any needed vaccines.   Please contact her for assistance.

## 2012-08-22 NOTE — Telephone Encounter (Signed)
Orders placed.

## 2012-08-22 NOTE — Telephone Encounter (Signed)
Done hardcopy to robin  She should be ok for "routine vaccinations" although if there is any other specific for her employment, she would need to let us know ( i.e. Hep B or Hep A, or typhoid or other)

## 2012-08-23 LAB — HEPATITIS B SURFACE ANTIBODY,QUALITATIVE: Hep B S Ab: POSITIVE — AB

## 2012-08-23 LAB — HEPATITIS A ANTIBODY, TOTAL: Hep A Total Ab: NEGATIVE

## 2012-08-25 ENCOUNTER — Encounter: Payer: Self-pay | Admitting: Internal Medicine

## 2012-08-26 ENCOUNTER — Ambulatory Visit: Payer: 59

## 2012-08-26 ENCOUNTER — Ambulatory Visit (INDEPENDENT_AMBULATORY_CARE_PROVIDER_SITE_OTHER): Payer: 59 | Admitting: Internal Medicine

## 2012-08-26 ENCOUNTER — Encounter: Payer: Self-pay | Admitting: Internal Medicine

## 2012-08-26 VITALS — BP 110/62 | HR 77 | Temp 100.1°F | Ht 65.0 in | Wt 149.0 lb

## 2012-08-26 DIAGNOSIS — F411 Generalized anxiety disorder: Secondary | ICD-10-CM

## 2012-08-26 DIAGNOSIS — F419 Anxiety disorder, unspecified: Secondary | ICD-10-CM

## 2012-08-26 DIAGNOSIS — Z23 Encounter for immunization: Secondary | ICD-10-CM

## 2012-08-26 MED ORDER — SERTRALINE HCL 100 MG PO TABS: ORAL_TABLET | ORAL | Status: DC

## 2012-08-26 NOTE — Patient Instructions (Addendum)
You had the tetanus and Hep A shot #1 today Please return in 6 months for Nurse Visit for Hep A shot #2 (that would be the last test) Take all new medications as prescribed - the sertraline Please start the sertraline at 50 mg per day for 5 days, then 100 mg for 5 days, then 150 mg per day after that;  You can increase to 200 mg at 3-4 wks if needed for the OCD symptoms if still present Continue all other medications as before Please have the pharmacy call with any other refills you may need. Please return in 6 months, or sooner if needed

## 2012-08-26 NOTE — Assessment & Plan Note (Signed)
Ok to add sertraline asd,  to f/u any worsening symptoms or concerns

## 2012-08-26 NOTE — Progress Notes (Signed)
Subjective:    Patient ID: Lauren Gonzalez, female    DOB: November 23, 1985, 26 y.o.   MRN: [id number]  HPI  Here to f/u; overall doing ok but klonopin not always working well for her anxiety/ocd type symptoms, Denies worsening depressive symptoms, suicidal ideation, or panic, though has hx of bipolar.  Not seeing psychiatry at this time.  Recently let go from employment at [employer] with insurance ending at the end of this month, but has had interviews, feels fairly confident she may other employment relatively soon.  Would like to avoid prozac on the [facility] $4 list for now.  Needs Hep A series done for future employment purpose with shot #1 today, has had serological evidence for Hep B series recently documented.  Has low grade temp today, but unaware of fever, chills, no URI symtpoms, cough, and  Denies urinary symptoms such as dysuria, frequency, urgency,or hematuria.  Denies worsening reflux, dysphagia, abd pain, n/v, bowel change or blood.  Due for tdap as well, Not pregnant, on nuvaring. Past Medical History  Diagnosis Date  . DISORDER, BIPOLAR NEC 07/05/2007  . HEMORRHOIDS, INTERNAL 05/10/2008  . Insomnia, unspecified 07/05/2007  . LOW BACK PAIN 07/05/2007  . MIGRAINE HEADACHE 07/05/2007  . NAUSEA 11/16/2008  . RECTAL PAIN 12/04/2007  . RUQ PAIN 05/12/2008  . SINUSITIS- ACUTE-NOS 10/27/2007  . URI 07/16/2008  . Insomnia 07/28/2012   Past Surgical History  Procedure Date  . Frenulectomy, lingual     of the base of the tongue  . Tympanostomy tube placement     as a child  . Wisdom tooth extraction   . S/p left hip labral tear surgury dec. 2010  . Cholecystectomy     reports that she has quit smoking. She does not have any smokeless tobacco history on file. She reports that she drinks alcohol. Her drug history not on file. family history includes Arthritis in her other; Cancer in her other; Colon polyps in her father; Depression in her other; Diabetes in her other; Heart disease in her other; and Stroke  in her other. No Known Allergies Current Outpatient Prescriptions on File Prior to Visit  Medication Sig Dispense Refill  . clonazePAM (KLONOPIN) 0.5 MG tablet Take 1 tablet (0.5 mg total) by mouth 2 (two) times daily as needed for anxiety.  60 tablet  2  . etonogestrel-ethinyl estradiol (NUVARING) 0.12-0.015 MG/24HR vaginal ring Place 1 each vaginally every 28 (twenty-eight) days. Insert vaginally and leave in place for 3 consecutive weeks, then remove for 1 week.       Marland Kitchen ibuprofen (ADVIL,MOTRIN) 800 MG tablet Take 1 tablet (800 mg total) by mouth every 6 (six) hours as needed.  60 tablet  2  . zolmitriptan (ZOMIG) 5 MG nasal solution Place 1 spray into the nose as needed. May repeat once after 2 hours  6 Units  11  . zolpidem (AMBIEN) 10 MG tablet Take 1 tablet (10 mg total) by mouth at bedtime as needed.  30 tablet  5  . sertraline (ZOLOFT) 100 MG tablet 2 tabs per day  180 tablet  3  . topiramate (TOPAMAX) 50 MG tablet Take 1 tablet (50 mg total) by mouth 2 (two) times daily.  60 tablet  5   Review of Systems  Constitutional: Negative for diaphoresis and unexpected weight change.  HENT: Negative for tinnitus.   Eyes: Negative for photophobia and visual disturbance.  Respiratory: Negative for choking and stridor.   Gastrointestinal: Negative for vomiting and blood in stool.  Genitourinary: Negative for hematuria and decreased urine volume.  Musculoskeletal: Negative for gait problem.  Skin: Negative for color change and wound.  Neurological: Negative for tremors and numbness.  Psychiatric/Behavioral: Negative for decreased concentration. The patient is not hyperactive.       Objective:   Physical Exam BP 110/62  Pulse 77  Temp 100.1 F (37.8 C) (Oral)  Ht 5\' 5"  (1.651 m)  Wt 149 lb (67.586 kg)  BMI 24.79 kg/m2  SpO2 98% Physical Exam  VS noted Constitutional: Pt appears well-developed and well-nourished.  HENT: Head: Normocephalic.  Right Ear: External ear normal.  Left Ear:  External ear normal.  Eyes: Conjunctivae and EOM are normal. Pupils are equal, round, and reactive to light.  Neck: Normal range of motion. Neck supple.  Cardiovascular: Normal rate and regular rhythm.   Pulmonary/Chest: Effort normal and breath sounds normal.  Abd:  Soft, NT, non-distended, + BS Neurological: Pt is alert. Not confused  Skin: Skin is warm. No erythema.  Psychiatric: Pt behavior is normal. Thought content normal. not depressed affect, 1-2+ nervous    Assessment & Plan:

## 2012-09-02 ENCOUNTER — Other Ambulatory Visit: Payer: Self-pay | Admitting: Gynecology

## 2013-07-06 ENCOUNTER — Telehealth: Payer: Self-pay | Admitting: *Deleted

## 2013-07-06 MED ORDER — CLONAZEPAM 0.5 MG PO TABS: 0.5000 mg | ORAL_TABLET | Freq: Two times a day (BID) | ORAL | Status: DC | PRN

## 2013-07-06 MED ORDER — ZOLPIDEM TARTRATE 10 MG PO TABS: 10.0000 mg | ORAL_TABLET | Freq: Every evening | ORAL | Status: DC | PRN

## 2013-07-06 NOTE — Telephone Encounter (Signed)
Pt called requesting Ambien and Clonazepam Rx refill.  Please advise

## 2013-07-06 NOTE — Telephone Encounter (Signed)
Done hardcopy to robin  Due for ROV late nov 2014 or thereafter

## 2013-07-07 NOTE — Telephone Encounter (Signed)
Faxed hardcopy to [facility] 

## 2013-07-07 NOTE — Telephone Encounter (Signed)
faxed

## 2013-07-28 ENCOUNTER — Other Ambulatory Visit (INDEPENDENT_AMBULATORY_CARE_PROVIDER_SITE_OTHER): Payer: BC Managed Care – PPO

## 2013-07-28 ENCOUNTER — Encounter: Payer: Self-pay | Admitting: Internal Medicine

## 2013-07-28 ENCOUNTER — Ambulatory Visit (INDEPENDENT_AMBULATORY_CARE_PROVIDER_SITE_OTHER): Payer: BC Managed Care – PPO | Admitting: Internal Medicine

## 2013-07-28 VITALS — BP 104/80 | HR 82 | Temp 98.5°F | Ht 65.0 in | Wt 153.4 lb

## 2013-07-28 DIAGNOSIS — N390 Urinary tract infection, site not specified: Secondary | ICD-10-CM

## 2013-07-28 DIAGNOSIS — N912 Amenorrhea, unspecified: Secondary | ICD-10-CM

## 2013-07-28 DIAGNOSIS — N924 Excessive bleeding in the premenopausal period: Secondary | ICD-10-CM | POA: Insufficient documentation

## 2013-07-28 DIAGNOSIS — G43909 Migraine, unspecified, not intractable, without status migrainosus: Secondary | ICD-10-CM

## 2013-07-28 DIAGNOSIS — J309 Allergic rhinitis, unspecified: Secondary | ICD-10-CM

## 2013-07-28 DIAGNOSIS — F411 Generalized anxiety disorder: Secondary | ICD-10-CM

## 2013-07-28 DIAGNOSIS — F419 Anxiety disorder, unspecified: Secondary | ICD-10-CM

## 2013-07-28 LAB — URINALYSIS, ROUTINE W REFLEX MICROSCOPIC
Bilirubin Urine: NEGATIVE
Total Protein, Urine: NEGATIVE
Urine Glucose: NEGATIVE
pH: 6.5 (ref 5.0–8.0)

## 2013-07-28 MED ORDER — CEPHALEXIN 500 MG PO CAPS: 500.0000 mg | ORAL_CAPSULE | Freq: Four times a day (QID) | ORAL | Status: DC

## 2013-07-28 MED ORDER — CLONAZEPAM 1 MG PO TABS: 1.0000 mg | ORAL_TABLET | Freq: Two times a day (BID) | ORAL | Status: DC | PRN

## 2013-07-28 MED ORDER — SERTRALINE HCL 100 MG PO TABS: ORAL_TABLET | ORAL | Status: DC

## 2013-07-28 MED ORDER — CETIRIZINE HCL 10 MG PO TABS: 10.0000 mg | ORAL_TABLET | Freq: Every day | ORAL | Status: DC

## 2013-07-28 MED ORDER — TRIAMCINOLONE ACETONIDE 0.1 % EX CREA: TOPICAL_CREAM | Freq: Two times a day (BID) | CUTANEOUS | Status: DC

## 2013-07-28 MED ORDER — ZOLPIDEM TARTRATE 10 MG PO TABS: 10.0000 mg | ORAL_TABLET | Freq: Every evening | ORAL | Status: DC | PRN

## 2013-07-28 NOTE — Assessment & Plan Note (Signed)
Prob, Mild to mod, for antibx course,  to f/u any worsening symptoms or concerns

## 2013-07-28 NOTE — Progress Notes (Signed)
Subjective:    Patient ID: Lauren Gonzalez, female    DOB: 1985/11/06, 27 y.o.   MRN: [id number]  HPI  Here with 2 days onset dysuria with freq but Denies flank pain, hematuria or n/v, fever, chills, or urgency.  Also, Now works in [employer], getting Hives to cats, asks for note to be able to not have to handle cats or work in [employer], also has migraine 1-2 times per month, asks for note for this as well.  Also with eczematous areas to torso, upper chest, asks for steroid cream.  Denies worsening depressive symptoms, suicidal ideation, or panic; has ongoing anxiety,some better with increased zoloft to 150 qd, but current klonopin not helping reduce tendency to panic, and more difficult to sleep recent with getting to sleep and staying also asleep Past Medical History  Diagnosis Date  . DISORDER, BIPOLAR NEC 07/05/2007  . HEMORRHOIDS, INTERNAL 05/10/2008  . Insomnia, unspecified 07/05/2007  . LOW BACK PAIN 07/05/2007  . MIGRAINE HEADACHE 07/05/2007  . NAUSEA 11/16/2008  . RECTAL PAIN 12/04/2007  . RUQ PAIN 05/12/2008  . SINUSITIS- ACUTE-NOS 10/27/2007  . URI 07/16/2008  . Insomnia 07/28/2012  . Allergic rhinitis, cause unspecified 07/28/2013   Past Surgical History  Procedure Laterality Date  . Frenulectomy, lingual      of the base of the tongue  . Tympanostomy tube placement      as a child  . Wisdom tooth extraction    . S/p left hip labral tear surgury  dec. 2010  . Cholecystectomy      reports that she has quit smoking. She does not have any smokeless tobacco history on file. She reports that she drinks alcohol. Her drug history is not on file. family history includes Arthritis in her other; Cancer in her other; Colon polyps in her father; Depression in her other; Diabetes in her other; Heart disease in her other; Stroke in her other. No Known Allergies Current Outpatient Prescriptions on File Prior to Visit  Medication Sig Dispense Refill  . etonogestrel-ethinyl estradiol (NUVARING)  0.12-0.015 MG/24HR vaginal ring Place 1 each vaginally every 28 (twenty-eight) days. Insert vaginally and leave in place for 3 consecutive weeks, then remove for 1 week.       Marland Kitchen ibuprofen (ADVIL,MOTRIN) 800 MG tablet Take 1 tablet (800 mg total) by mouth every 6 (six) hours as needed.  60 tablet  2  . zolmitriptan (ZOMIG) 5 MG nasal solution Place 1 spray into the nose as needed. May repeat once after 2 hours  6 Units  11  . topiramate (TOPAMAX) 50 MG tablet Take 1 tablet (50 mg total) by mouth 2 (two) times daily.  60 tablet  5   No current facility-administered medications on file prior to visit.   Review of Systems  Constitutional: Negative for unexpected weight change, or unusual diaphoresis  HENT: Negative for tinnitus.   Eyes: Negative for photophobia and visual disturbance.  Respiratory: Negative for choking and stridor.   Gastrointestinal: Negative for vomiting and blood in stool.  Genitourinary: Negative for hematuria and decreased urine volume.  Musculoskeletal: Negative for acute joint swelling Skin: Negative for color change and wound.  Neurological: Negative for tremors and numbness other than noted  Psychiatric/Behavioral: Negative for decreased concentration or  hyperactivity.       Objective:   Physical Exam BP 104/80  Pulse 82  Temp(Src) 98.5 F (36.9 C) (Oral)  Ht 5\' 5"  (1.651 m)  Wt 153 lb 6 oz (69.57 kg)  BMI 25.52 kg/m2  SpO2 98% VS noted,  Constitutional: Pt appears well-developed and well-nourished.  HENT: Head: NCAT.  Right Ear: External ear normal.  Left Ear: External ear normal.  Eyes: Conjunctivae and EOM are normal. Pupils are equal, round, and reactive to light.  Neck: Normal range of motion. Neck supple.  Cardiovascular: Normal rate and regular rhythm.   Pulmonary/Chest: Effort normal and breath sounds normal.  Abd:  Soft,  non-distended, + BS with low mid abd tender, no guarding or rebound Neurological: Pt is alert. Not confused , motor  5/5 Skin: Skin is warm. No erythema. except for eczematous lesions to torso and right arm, no current hives Psychiatric: Pt behavior is normal. Thought content normal.     Assessment & Plan:

## 2013-07-28 NOTE — Assessment & Plan Note (Signed)
Has nuvaring, but asks for urine preg test

## 2013-07-28 NOTE — Assessment & Plan Note (Signed)
For zyrtec daily and avoid cats/gave work note

## 2013-07-28 NOTE — Assessment & Plan Note (Signed)
Overalls table, for work note as requested, cont same tx

## 2013-07-28 NOTE — Assessment & Plan Note (Signed)
Ok for incr zoloft to 150 qd, consider depakote mood stabilizing or psych referral, ok for klonopin asd bid prn

## 2013-07-28 NOTE — Patient Instructions (Signed)
Please take all new medication as prescribed - the antibiotic Please go to the LAB in the Basement (turn left off the elevator) for the tests to be done today - the urine tests (culture and pregnancy test) OK to take the zoloft at 1.5 tabs per day OK to increase the klonopin as discussed Please take all new medication as prescribed - the zyrtec daily, and the steroid cream as needed Please continue all other medications as before, and refills have been done if requested - the [facility]  You are given the letter for work today

## 2013-07-29 LAB — PREGNANCY, URINE: Preg Test, Ur: NEGATIVE

## 2013-07-30 LAB — URINE CULTURE: Colony Count: >=100000

## 2013-08-13 ENCOUNTER — Other Ambulatory Visit: Payer: Self-pay

## 2013-10-06 ENCOUNTER — Ambulatory Visit (INDEPENDENT_AMBULATORY_CARE_PROVIDER_SITE_OTHER): Payer: BC Managed Care – PPO | Admitting: Internal Medicine

## 2013-10-06 ENCOUNTER — Encounter: Payer: Self-pay | Admitting: Internal Medicine

## 2013-10-06 ENCOUNTER — Encounter: Payer: Self-pay | Admitting: *Deleted

## 2013-10-06 VITALS — BP 98/62 | HR 109 | Temp 98.6°F

## 2013-10-06 DIAGNOSIS — G43909 Migraine, unspecified, not intractable, without status migrainosus: Secondary | ICD-10-CM

## 2013-10-06 DIAGNOSIS — J069 Acute upper respiratory infection, unspecified: Secondary | ICD-10-CM

## 2013-10-06 MED ORDER — PHENYLEPH-PROMETHAZINE-COD 5-6.25-10 MG/5ML PO SYRP
5.0000 mL | ORAL_SOLUTION | ORAL | Status: DC | PRN
Start: 2013-10-06 — End: 2013-10-30

## 2013-10-06 MED ORDER — IBUPROFEN 800 MG PO TABS: 800.0000 mg | ORAL_TABLET | Freq: Four times a day (QID) | ORAL | Status: DC | PRN

## 2013-10-06 NOTE — Progress Notes (Signed)
   Subjective:    Patient ID: Lauren Gonzalez, female    DOB: [birth year], 27 y.o.   MRN: [id number]  Cough This is a new problem. The current episode started in the past 7 days. The problem has been gradually worsening. The problem occurs every few minutes. The cough is productive of sputum. Associated symptoms include chills, a fever, headaches, myalgias, nasal congestion, postnasal drip and a sore throat. Pertinent negatives include no heartburn, hemoptysis or shortness of breath. Nothing aggravates the symptoms. She has tried OTC cough suppressant for the symptoms. The treatment provided mild relief. Her past medical history is significant for environmental allergies. There is no history of asthma.    PMH reviewed  Review of Systems  Constitutional: Positive for fever and chills.  HENT: Positive for postnasal drip and sore throat.   Respiratory: Positive for cough. Negative for hemoptysis and shortness of breath.   Gastrointestinal: Negative for heartburn.  Musculoskeletal: Positive for myalgias.  Allergic/Immunologic: Positive for environmental allergies.  Neurological: Positive for headaches.       Objective:   Physical Exam BP 98/62  Pulse 109  Temp(Src) 98.6 F (37 C) (Oral)  SpO2 97% Wt Readings from Last 3 Encounters:  07/28/13 153 lb 6 oz (69.57 kg)  08/26/12 149 lb (67.586 kg)  07/28/12 150 lb 2 oz (68.096 kg)   Constitutional: She appears fatigued, but well-developed and well-nourished. No distress.  HENT: Head: Normocephalic and atraumatic. Mild sinus tenderness maxillary region bilaterally. Ears: B TMs ok, no erythema or effusion; Nose: right greater than left turbinate swelling with mild pallor, no discharge. Mouth/Throat: Oropharynx is moderately red, but clear and moist. No oropharyngeal exudate.  Eyes: Conjunctivae and EOM are normal. Pupils are equal, round, and reactive to light. No scleral icterus.  Neck: Normal range of motion. Neck supple. No JVD present. No  thyromegaly present.  Cardiovascular: Normal rate, regular rhythm and normal heart sounds.  No murmur heard. No BLE edema. Pulmonary/Chest: Effort normal and breath sounds normal. No respiratory distress. She has no wheezes.  Abdominal: Soft. Bowel sounds are normal. She exhibits no distension. There is no tenderness. no masses Neurological: She is alert and oriented to person, place, and time. No cranial nerve deficit. Coordination, balance, strength, speech and gait are normal.  Skin: Skin is warm and dry. No rash noted. No erythema.  Psychiatric: She has a normal mood and affect. Her behavior is normal. Judgment and thought content normal.   Lab Results  Component Value Date   WBC 6.1 09/26/2009   HGB 13.4 09/26/2009   HCT 39.2 09/26/2009   PLT 244 09/26/2009   GLUCOSE 95 09/26/2009   ALT 26 09/26/2009   AST 39* 09/26/2009   NA 139 09/26/2009   K 4.8 09/26/2009   CL 107 09/26/2009   CREATININE 0.72 09/26/2009   BUN 7 09/26/2009   CO2 23 09/26/2009       Assessment & Plan:   Viral URI with cough  Explained lack of efficacy for traditional antibiotics in viral disease Symptoms not classic for influenza, also outside 48h onset of symptoms  Symptomatic care advised -Promethazine VC with codeine plus ibuprofen/Tylenol as needed  Patient agrees to call symptoms worse or unimproved Work excuse provided

## 2013-10-06 NOTE — Progress Notes (Signed)
Pre-visit discussion using our clinic review tool. No additional management support is needed unless otherwise documented below in the visit note.  

## 2013-10-06 NOTE — Patient Instructions (Addendum)
It was good to see you today.  If you develop worsening symptoms or fever, call and we can reconsider antibiotics, but it does not appear necessary to use antibiotics at this time.  prescription cough/congestion syrup - Your prescription(s) have been given to you to submit to your pharmacy. Please take as directed and contact our office if you believe you are having problem(s) with the medication(s).  Alternate between ibuprofen and tylenol for aches, pain and fever symptoms as discussed  Hydrate, rest and call if worse or unimproved  Work excuse note for yesterday thru tomorrow as discussed Upper Respiratory Infection, Adult An upper respiratory infection (URI) is also sometimes known as the common cold. The upper respiratory tract includes the nose, sinuses, throat, trachea, and bronchi. Bronchi are the airways leading to the lungs. Most people improve within 1 week, but symptoms can last up to 2 weeks. A residual cough may last even longer.  CAUSES Many different viruses can infect the tissues lining the upper respiratory tract. The tissues become irritated and inflamed and often become very moist. Mucus production is also common. A cold is contagious. You can easily spread the virus to others by oral contact. This includes kissing, sharing a glass, coughing, or sneezing. Touching your mouth or nose and then touching a surface, which is then touched by another person, can also spread the virus. SYMPTOMS  Symptoms typically develop 1 to 3 days after you come in contact with a cold virus. Symptoms vary from person to person. They may include:  Runny nose.  Sneezing.  Nasal congestion.  Sinus irritation.  Sore throat.  Loss of voice (laryngitis).  Cough.  Fatigue.  Muscle aches.  Loss of appetite.  Headache.  Low-grade fever. DIAGNOSIS  You might diagnose your own cold based on familiar symptoms, since most people get a cold 2 to 3 times a year. Your caregiver can confirm  this based on your exam. Most importantly, your caregiver can check that your symptoms are not due to another disease such as strep throat, sinusitis, pneumonia, asthma, or epiglottitis. Blood tests, throat tests, and X-rays are not necessary to diagnose a common cold, but they may sometimes be helpful in excluding other more serious diseases. Your caregiver will decide if any further tests are required. RISKS AND COMPLICATIONS  You may be at risk for a more severe case of the common cold if you smoke cigarettes, have chronic heart disease (such as heart failure) or lung disease (such as asthma), or if you have a weakened immune system. The very young and very old are also at risk for more serious infections. Bacterial sinusitis, middle ear infections, and bacterial pneumonia can complicate the common cold. The common cold can worsen asthma and chronic obstructive pulmonary disease (COPD). Sometimes, these complications can require emergency medical care and may be life-threatening. PREVENTION  The best way to protect against getting a cold is to practice good hygiene. Avoid oral or hand contact with people with cold symptoms. Wash your hands often if contact occurs. There is no clear evidence that vitamin C, vitamin E, echinacea, or exercise reduces the chance of developing a cold. However, it is always recommended to get plenty of rest and practice good nutrition. TREATMENT  Treatment is directed at relieving symptoms. There is no cure. Antibiotics are not effective, because the infection is caused by a virus, not by bacteria. Treatment may include:  Increased fluid intake. Sports drinks offer valuable electrolytes, sugars, and fluids.  Breathing heated mist or  steam (vaporizer or shower).  Eating chicken soup or other clear broths, and maintaining good nutrition.  Getting plenty of rest.  Using gargles or lozenges for comfort.  Controlling fevers with ibuprofen or acetaminophen as directed by  your caregiver.  Increasing usage of your inhaler if you have asthma. Zinc gel and zinc lozenges, taken in the first 24 hours of the common cold, can shorten the duration and lessen the severity of symptoms. Pain medicines may help with fever, muscle aches, and throat pain. A variety of non-prescription medicines are available to treat congestion and runny nose. Your caregiver can make recommendations and may suggest nasal or lung inhalers for other symptoms.  HOME CARE INSTRUCTIONS   Only take over-the-counter or prescription medicines for pain, discomfort, or fever as directed by your caregiver.  Use a warm mist humidifier or inhale steam from a shower to increase air moisture. This may keep secretions moist and make it easier to breathe.  Drink enough water and fluids to keep your urine clear or pale yellow.  Rest as needed.  Return to work when your temperature has returned to normal or as your caregiver advises. You may need to stay home longer to avoid infecting others. You can also use a face mask and careful hand washing to prevent spread of the virus. SEEK MEDICAL CARE IF:   After the first few days, you feel you are getting worse rather than better.  You need your caregiver's advice about medicines to control symptoms.  You develop chills, worsening shortness of breath, or brown or red sputum. These may be signs of pneumonia.  You develop yellow or brown nasal discharge or pain in the face, especially when you bend forward. These may be signs of sinusitis.  You develop a fever, swollen neck glands, pain with swallowing, or white areas in the back of your throat. These may be signs of strep throat. SEEK IMMEDIATE MEDICAL CARE IF:   You have a fever.  You develop severe or persistent headache, ear pain, sinus pain, or chest pain.  You develop wheezing, a prolonged cough, cough up blood, or have a change in your usual mucus (if you have chronic lung disease).  You develop  sore muscles or a stiff neck. Document Released: 03/20/2001 Document Revised: 12/17/2011 Document Reviewed: 01/26/2011 [facility] Patient Information 2014 [place], [place].

## 2013-10-29 ENCOUNTER — Ambulatory Visit: Payer: BC Managed Care – PPO | Admitting: Internal Medicine

## 2013-10-30 ENCOUNTER — Other Ambulatory Visit (INDEPENDENT_AMBULATORY_CARE_PROVIDER_SITE_OTHER): Payer: BC Managed Care – PPO

## 2013-10-30 ENCOUNTER — Encounter: Payer: Self-pay | Admitting: Internal Medicine

## 2013-10-30 ENCOUNTER — Ambulatory Visit (INDEPENDENT_AMBULATORY_CARE_PROVIDER_SITE_OTHER): Payer: BC Managed Care – PPO | Admitting: Internal Medicine

## 2013-10-30 VITALS — BP 102/70 | HR 90 | Temp 97.0°F | Ht 65.0 in | Wt 150.1 lb

## 2013-10-30 DIAGNOSIS — Z0000 Encounter for general adult medical examination without abnormal findings: Secondary | ICD-10-CM

## 2013-10-30 DIAGNOSIS — R41840 Attention and concentration deficit: Secondary | ICD-10-CM

## 2013-10-30 DIAGNOSIS — F419 Anxiety disorder, unspecified: Secondary | ICD-10-CM

## 2013-10-30 DIAGNOSIS — F411 Generalized anxiety disorder: Secondary | ICD-10-CM

## 2013-10-30 LAB — HEPATIC FUNCTION PANEL
ALBUMIN: 3.9 g/dL (ref 3.5–5.2)
ALK PHOS: 46 U/L (ref 39–117)
ALT: 15 U/L (ref 0–35)
AST: 25 U/L (ref 0–37)
Bilirubin, Direct: 0.1 mg/dL (ref 0.0–0.3)
TOTAL PROTEIN: 8 g/dL (ref 6.0–8.3)
Total Bilirubin: 1 mg/dL (ref 0.3–1.2)

## 2013-10-30 LAB — BASIC METABOLIC PANEL
BUN: 9 mg/dL (ref 6–23)
CALCIUM: 9 mg/dL (ref 8.4–10.5)
CO2: 24 mEq/L (ref 19–32)
Chloride: 108 mEq/L (ref 96–112)
Creatinine, Ser: 0.7 mg/dL (ref 0.4–1.2)
GFR: 106.73 mL/min (ref >60.00)
Glucose, Bld: 85 mg/dL (ref 70–99)
Potassium: 4.1 mEq/L (ref 3.5–5.1)
Sodium: 139 mEq/L (ref 135–145)

## 2013-10-30 LAB — CBC WITH DIFFERENTIAL/PLATELET
Basophils Absolute: 0 K/uL (ref 0.0–0.1)
Basophils Relative: 0.4 % (ref 0.0–3.0)
Eosinophils Absolute: 0.1 K/uL (ref 0.0–0.7)
Eosinophils Relative: 1.3 % (ref 0.0–5.0)
HCT: 36 % (ref 36.0–46.0)
Hemoglobin: 12.4 g/dL (ref 12.0–15.0)
Lymphocytes Relative: 46.8 % — ABNORMAL HIGH (ref 12.0–46.0)
Lymphs Abs: 2.6 K/uL (ref 0.7–4.0)
MCHC: 34.3 g/dL (ref 30.0–36.0)
MCV: 90.4 fl (ref 78.0–100.0)
Monocytes Absolute: 0.4 K/uL (ref 0.1–1.0)
Monocytes Relative: 6.5 % (ref 3.0–12.0)
Neutro Abs: 2.5 K/uL (ref 1.4–7.7)
Neutrophils Relative %: 45 % (ref 43.0–77.0)
Platelets: 225 K/uL (ref 150.0–400.0)
RBC: 3.98 Mil/uL (ref 3.87–5.11)
RDW: 13 % (ref 11.5–14.6)
WBC: 5.6 K/uL (ref 4.5–10.5)

## 2013-10-30 LAB — URINALYSIS, ROUTINE W REFLEX MICROSCOPIC
Bilirubin Urine: NEGATIVE
KETONES UR: NEGATIVE
Nitrite: NEGATIVE
PH: 6 (ref 5.0–8.0)
URINE GLUCOSE: NEGATIVE
Urobilinogen, UA: 0.2 (ref 0.0–1.0)

## 2013-10-30 LAB — LIPID PANEL
CHOLESTEROL: 222 mg/dL — AB (ref 0–200)
HDL: 90.4 mg/dL (ref >39.00)
TRIGLYCERIDES: 97 mg/dL (ref 0.0–149.0)
Total CHOL/HDL Ratio: 2
VLDL: 19.4 mg/dL (ref 0.0–40.0)

## 2013-10-30 LAB — LDL CHOLESTEROL, DIRECT: Direct LDL: 124.1 mg/dL

## 2013-10-30 LAB — TSH: TSH: 1.23 u[IU]/mL (ref 0.35–5.50)

## 2013-10-30 MED ORDER — METHYLPHENIDATE HCL ER (OSM) 18 MG PO TBCR: 18.0000 mg | EXTENDED_RELEASE_TABLET | Freq: Every day | ORAL | Status: DC

## 2013-10-30 MED ORDER — CLONAZEPAM 1 MG PO TABS: 1.0000 mg | ORAL_TABLET | Freq: Two times a day (BID) | ORAL | Status: DC | PRN

## 2013-10-30 MED ORDER — SERTRALINE HCL 100 MG PO TABS: ORAL_TABLET | ORAL | Status: DC

## 2013-10-30 MED ORDER — TRIAMCINOLONE ACETONIDE 0.1 % EX CREA: TOPICAL_CREAM | Freq: Two times a day (BID) | CUTANEOUS | Status: DC

## 2013-10-30 NOTE — Patient Instructions (Signed)
Please take all new medication as prescribed - the concerta low dose OK to increase the zoloft to 150 mg per day Please continue all other medications as before, and refills have been done if requested - the klonopin Please have the pharmacy call with any other refills you may need.  You will be contacted regarding the referral for: ADD testing (psychology referral)  Please go to the LAB in the Basement (turn left off the elevator) for the tests to be done today You will be contacted by phone if any changes need to be made immediately.  Otherwise, you will receive a letter about your results with an explanation, but please check with MyChart first.  Please keep your appointments with your specialists as you have planned - GYN  Please return in 6 months, or sooner if needed

## 2013-10-30 NOTE — Assessment & Plan Note (Signed)

## 2013-10-30 NOTE — Progress Notes (Signed)
Subjective:    Patient ID: Lauren Gonzalez, female    DOB: 01-Oct-1986, 28 y.o.   MRN: [id number]  HPI Here for wellness and f/u;  Overall doing ok;  Pt denies CP, worsening SOB, DOE, wheezing, orthopnea, PND, worsening LE edema, palpitations, dizziness or syncope.  Pt denies neurological change such as new headache, facial or extremity weakness.  Pt denies polydipsia, polyuria, or low sugar symptoms. Pt states overall good compliance with treatment and medications, good tolerability, and has been trying to follow lower cholesterol diet.  Pt denies worsening depressive symptoms, suicidal ideation or panic. No fever, night sweats, wt loss, loss of appetite, or other constitutional symptoms.  Pt states good ability with ADL's, has low fall risk, home safety reviewed and adequate, no other significant changes in hearing or vision, and only occasionally active with exercise. Decliens flu shot, just got over URI and doesn't want any possible side effect. Just had pap, due for cervical biopsy soon. Does have significant attention issues, anxiety, and was tested as child and found borderline for ADD, no prior tx to date, but symptoms making adult work and social difficult, not in danger of losing job, but will be transitioning to part time work and classes by summer 2015 to train for new career. No FH of similar ADD. Of note has only been taking 50 mg zoloft due to increased teeth grinding at night, thought might be a side effect Past Medical History  Diagnosis Date  . DISORDER, BIPOLAR NEC   . Insomnia, unspecified   . MIGRAINE HEADACHE   . Allergic rhinitis, cause unspecified    Past Surgical History  Procedure Laterality Date  . Frenulectomy, lingual      of the base of the tongue  . Tympanostomy tube placement      as a child  . Wisdom tooth extraction    . S/p left hip labral tear surgury  dec. 2010  . Cholecystectomy      reports that she has quit smoking. She does not have any smokeless tobacco  history on file. She reports that she drinks alcohol. Her drug history is not on file. family history includes Arthritis in her other; Cancer in her other; Colon polyps in her father; Depression in her other; Diabetes in her other; Heart disease in her other; Stroke in her other. No Known Allergies Current Outpatient Prescriptions on File Prior to Visit  Medication Sig Dispense Refill  . cetirizine (ZYRTEC) 10 MG tablet Take 1 tablet (10 mg total) by mouth daily.  90 tablet  3  . clonazePAM (KLONOPIN) 1 MG tablet Take 1 tablet (1 mg total) by mouth 2 (two) times daily as needed for anxiety.  60 tablet  2  . etonogestrel-ethinyl estradiol (NUVARING) 0.12-0.015 MG/24HR vaginal ring Place 1 each vaginally every 28 (twenty-eight) days. Insert vaginally and leave in place for 3 consecutive weeks, then remove for 1 week.       Marland Kitchen. ibuprofen (ADVIL,MOTRIN) 800 MG tablet Take 1 tablet (800 mg total) by mouth every 6 (six) hours as needed.  60 tablet  2  . Phenyleph-Promethazine-Cod (PROMETHAZINE VC/CODEINE) 5-6.25-10 MG/5ML SYRP Take 5 mLs by mouth every 4 (four) hours as needed (cough/congestion).  180 mL  0  . sertraline (ZOLOFT) 100 MG tablet 1 and 1/2 tab by mouth per day  135 tablet  3  . triamcinolone cream (KENALOG) 0.1 % Apply topically 2 (two) times daily.  30 g  0  . zolmitriptan (ZOMIG) 5 MG nasal  solution Place 1 spray into the nose as needed. May repeat once after 2 hours  6 Units  11  . zolpidem (AMBIEN) 10 MG tablet Take 1 tablet (10 mg total) by mouth at bedtime as needed.  30 tablet  5  . topiramate (TOPAMAX) 50 MG tablet Take 1 tablet (50 mg total) by mouth 2 (two) times daily.  60 tablet  5   No current facility-administered medications on file prior to visit.     Review of Systems Constitutional: Negative for diaphoresis, activity change, appetite change or unexpected weight change.  HENT: Negative for hearing loss, ear pain, facial swelling, mouth sores and neck stiffness.   Eyes:  Negative for pain, redness and visual disturbance.  Respiratory: Negative for shortness of breath and wheezing.   Cardiovascular: Negative for chest pain and palpitations.  Gastrointestinal: Negative for diarrhea, blood in stool, abdominal distention or other pain Genitourinary: Negative for hematuria, flank pain or change in urine volume.  Musculoskeletal: Negative for myalgias and joint swelling.  Skin: Negative for color change and wound.  Neurological: Negative for syncope and numbness. other than noted Hematological: Negative for adenopathy.  Psychiatric/Behavioral: Negative for hallucinations, self-injury, decreased concentration and agitation.      Objective:   Physical Exam BP 102/70  Pulse 90  Temp(Src) 97 F (36.1 C) (Oral)  Ht 5\' 5"  (1.651 m)  Wt 150 lb 2 oz (68.096 kg)  BMI 24.98 kg/m2  SpO2 97% VS noted,  Constitutional: Pt is oriented to person, place, and time. Appears well-developed and well-nourished.  Head: Normocephalic and atraumatic.  Right Ear: External ear normal.  Left Ear: External ear normal.  Nose: Nose normal.  Mouth/Throat: Oropharynx is clear and moist.  Eyes: Conjunctivae and EOM are normal. Pupils are equal, round, and reactive to light.  Neck: Normal range of motion. Neck supple. No JVD present. No tracheal deviation present.  Cardiovascular: Normal rate, regular rhythm, normal heart sounds and intact distal pulses.   Pulmonary/Chest: Effort normal and breath sounds normal.  Abdominal: Soft. Bowel sounds are normal. There is no tenderness. No HSM  Musculoskeletal: Normal range of motion. Exhibits no edema.  Lymphadenopathy:  Has no cervical adenopathy.  Neurological: Pt is alert and oriented to person, place, and time. Pt has normal reflexes. No cranial nerve deficit.  Skin: Skin is warm and dry. No rash noted.  Psychiatric:  Has mod anxious mood and affect. Behavior is normal.     Assessment & Plan:

## 2013-10-30 NOTE — Assessment & Plan Note (Signed)
?   ADD - for neuropsych eval, ok to try trial of low dose concerta

## 2013-10-30 NOTE — Assessment & Plan Note (Signed)
For increased zoloft to 150 qd, does not appear to need mood stabilizer at this time

## 2013-10-30 NOTE — Progress Notes (Signed)
Pre-visit discussion using our clinic review tool. No additional management support is needed unless otherwise documented below in the visit note.  

## 2013-10-30 NOTE — Addendum Note (Signed)
Addended by: Corwin LevinsJOHN,  W on: 10/30/2013 09:03 AM   Modules accepted: Orders

## 2013-11-03 ENCOUNTER — Telehealth: Payer: Self-pay

## 2013-11-03 DIAGNOSIS — R41840 Attention and concentration deficit: Secondary | ICD-10-CM

## 2013-11-03 NOTE — Telephone Encounter (Signed)
The patient would like to go on Adderall (Low Dose, not Extended release) as she has not heard anything from referral yet and getting ready to start a new spanish class.  Please advise

## 2013-11-03 NOTE — Telephone Encounter (Signed)
The patient called and stated she needs her adhd medication changed. She states the medicine she was prescribed causes her to be drowsy.  She is hoping for a medication to be called in that will help her focus.   Callback - [phone]

## 2013-11-03 NOTE — Telephone Encounter (Signed)
Sorry, no, we will need to wait for the testing results

## 2013-11-03 NOTE — Telephone Encounter (Signed)
I would hold off then, as the other medications are similar  We will need to wait for her ADD testing results to consider other tx, since it involves controlled substances

## 2013-11-03 NOTE — Telephone Encounter (Signed)
Patient informed and did request when she would hear back on referral.  Advise as do not see a referral.

## 2013-11-03 NOTE — Telephone Encounter (Signed)
Called left message to call back 

## 2013-11-03 NOTE — Telephone Encounter (Signed)
I thought I had definitly done the referral, but now do not see in documentation; now done per emr

## 2013-11-05 NOTE — Telephone Encounter (Signed)
I have now done it 4 times, not sure what else to do

## 2013-11-05 NOTE — Telephone Encounter (Signed)
I have done the referral now 3 times, but for some reason it does not show up on the referral tabs as being done  Please check with PCC's to see if this is received  OK for [employer] testing, but I suspect this may be a screening test, not really a full diagnostic evaluation that can be used to determine definite ADD and need for tx

## 2013-11-05 NOTE — Telephone Encounter (Signed)
The patient called back to followup on referral for ADD testing.  I do not see a referral under referrals??  Also the patient stated [employer] offers free testing for ADHD/ADD Advise please

## 2013-11-05 NOTE — Telephone Encounter (Signed)
Referral needs to be entered as psychology to Behavior [facility] and they can test there.  [facility] testing.

## 2013-11-06 NOTE — Telephone Encounter (Signed)
Dr Jonny RuizJohn there is no referral showing for this pt

## 2013-11-06 NOTE — Telephone Encounter (Signed)
I have done the referral to psychology for the 5th time  If not showiing, please ask jilda to address, I cannot address further

## 2013-11-06 NOTE — Telephone Encounter (Signed)
[facility] 11/06/13 mp

## 2013-12-18 ENCOUNTER — Ambulatory Visit (INDEPENDENT_AMBULATORY_CARE_PROVIDER_SITE_OTHER): Payer: BC Managed Care – PPO | Admitting: Licensed Clinical Social Worker

## 2013-12-18 DIAGNOSIS — F4322 Adjustment disorder with anxiety: Secondary | ICD-10-CM

## 2013-12-22 ENCOUNTER — Ambulatory Visit (INDEPENDENT_AMBULATORY_CARE_PROVIDER_SITE_OTHER): Payer: BC Managed Care – PPO | Admitting: Licensed Clinical Social Worker

## 2013-12-22 DIAGNOSIS — F909 Attention-deficit hyperactivity disorder, unspecified type: Secondary | ICD-10-CM

## 2013-12-24 ENCOUNTER — Telehealth: Payer: Self-pay | Admitting: Internal Medicine

## 2013-12-24 NOTE — Telephone Encounter (Signed)
unfort I dont recall seeing this, but can robin get name of provider to call for result of her r/o ADD evaluation?

## 2013-12-24 NOTE — Telephone Encounter (Signed)
Called left message to call back 

## 2013-12-24 NOTE — Telephone Encounter (Signed)
The patient was seen by Judithe ModestSusan Bond and she is to fax notes to PCP today.  The patient stated she will call that office to confirm they will be faxed.

## 2013-12-24 NOTE — Telephone Encounter (Signed)
I have not seen any faxed notes.

## 2013-12-24 NOTE — Telephone Encounter (Signed)
Pt wants to know if her results came in so she can get the RX for adderall.  She went for a phychiatric eval.

## 2013-12-25 NOTE — Telephone Encounter (Signed)
Called the patient left message informed notes not received yet.

## 2013-12-29 ENCOUNTER — Encounter: Payer: Self-pay | Admitting: Internal Medicine

## 2013-12-29 ENCOUNTER — Telehealth: Payer: Self-pay | Admitting: Internal Medicine

## 2013-12-29 ENCOUNTER — Telehealth: Payer: Self-pay

## 2013-12-29 DIAGNOSIS — F988 Other specified behavioral and emotional disorders with onset usually occurring in childhood and adolescence: Secondary | ICD-10-CM | POA: Insufficient documentation

## 2013-12-29 HISTORY — DX: Other specified behavioral and emotional disorders with onset usually occurring in childhood and adolescence: F98.8

## 2013-12-29 MED ORDER — AMPHETAMINE-DEXTROAMPHET ER 20 MG PO CP24: 20.0000 mg | ORAL_CAPSULE | Freq: Every day | ORAL | Status: DC

## 2013-12-29 NOTE — Telephone Encounter (Signed)
I have not seen any notes;  Robin to contact Judithe ModestSusan Bond office please

## 2013-12-29 NOTE — Telephone Encounter (Signed)
Called the patient and she stated she would call the office to resend notes.  Also did give her our fax number on the side we are.

## 2013-12-29 NOTE — Telephone Encounter (Signed)
The patient called again to find out of PCP had seen notes faxed over from [employer]an Bond's office.  She states she does need adderall.

## 2013-12-29 NOTE — Telephone Encounter (Signed)
Done hardcopy to robin  Received eval per psychology - ADD confirmed

## 2013-12-30 NOTE — Telephone Encounter (Signed)
Called the patient informed hardcopy is ready for pickup at the front desk.  Also explained controlled medication contract and UDS.  Patient agreed and understood instructions.

## 2014-01-21 ENCOUNTER — Telehealth: Payer: Self-pay | Admitting: Internal Medicine

## 2014-01-21 MED ORDER — AMPHETAMINE-DEXTROAMPHET ER 20 MG PO CP24: ORAL_CAPSULE | ORAL | Status: DC

## 2014-01-21 NOTE — Telephone Encounter (Signed)
Ok for try bid dosing - Done hardcopy to [employer]

## 2014-01-21 NOTE — Telephone Encounter (Signed)
Ok to continue current dose if able to function ok at work/school, and socially as well

## 2014-01-21 NOTE — Telephone Encounter (Signed)
Informed the patient of MD instructions.  She states she is still having focusing problems and wondered if it needed to be increased.

## 2014-01-21 NOTE — Telephone Encounter (Signed)
Pt is taking adderall.  It is working but she still day dreams.  She wants to know if the dose should be increased.  Please call her.

## 2014-01-22 ENCOUNTER — Telehealth: Payer: Self-pay | Admitting: *Deleted

## 2014-01-22 NOTE — Telephone Encounter (Signed)
PA for Adderall on your desk for completion.

## 2014-01-22 NOTE — Telephone Encounter (Signed)
Message left notifying patient and Rx placed up front for pick up. Advised to bring ID.

## 2014-01-25 ENCOUNTER — Encounter: Payer: Self-pay | Admitting: Internal Medicine

## 2014-01-25 ENCOUNTER — Telehealth: Payer: Self-pay

## 2014-01-25 NOTE — Telephone Encounter (Signed)
The patient is hoping to get a call from the CMA regarding her adderrall rx.  She states the insurance company needed to send over a prior auth in order for her to get this medication.   Thanks!

## 2014-01-25 NOTE — Telephone Encounter (Signed)
I finished the PA form, now to be faxed i think, thanks

## 2014-01-25 NOTE — Telephone Encounter (Signed)
I have filled out the PA form for this pt, thanks

## 2014-01-25 NOTE — Telephone Encounter (Signed)
Notified pt md has completed form has been faxed back to insurance waiting on approval status...Raechel Chute/lmb

## 2014-01-27 NOTE — Telephone Encounter (Signed)
Ok to let pt know; all I can do is have pt continue the current once daily prescriptoin I believe she already has, thanks

## 2014-01-27 NOTE — Telephone Encounter (Signed)
Received PA back med has been denied. It states does not meet the definition of medical necessity. The quality dose requested is greater than maximum dose recommended by FDA. Place on md desk to review...Raechel Chute/lmb

## 2014-01-27 NOTE — Telephone Encounter (Signed)
Called to check status of PA spoke with [employer] rep she states PA is still in clinical review should received status within 24-48 hours...Raechel Chute/lmb

## 2014-01-28 MED ORDER — AMPHETAMINE-DEXTROAMPHET ER 30 MG PO CP24: 30.0000 mg | ORAL_CAPSULE | Freq: Every day | ORAL | Status: DC

## 2014-01-28 NOTE — Telephone Encounter (Signed)
Not a bad idea, ok for change to 30 mg xr adderall - Done hardcopy to robin

## 2014-01-28 NOTE — Telephone Encounter (Signed)
Duplicate msg see previous PA was denied md change to adderral 30 mg.../lmb

## 2014-01-28 NOTE — Telephone Encounter (Signed)
Notified pt with md response. Place  Script up front for pick-up...Raechel Chute/lmb

## 2014-01-28 NOTE — Telephone Encounter (Signed)
Notified pt with md response. Pt is wanting to know instead of taking two 20 mg twice a day, can she get rx for adderral 30 mg...lmb

## 2014-02-16 ENCOUNTER — Telehealth: Payer: Self-pay | Admitting: *Deleted

## 2014-02-16 DIAGNOSIS — F988 Other specified behavioral and emotional disorders with onset usually occurring in childhood and adolescence: Secondary | ICD-10-CM

## 2014-02-16 NOTE — Telephone Encounter (Signed)
ToRoma Schanz: [place] Fax: [phone] From: [employer] Date/ Time: 02/15/2014 7:01 PM Taken By: Patria ManeGail Simmons, CSR Caller: Akyia Facility: home Patient: Lauren Gonzalez, Lauren Gonzalez DOB: Feb 28, 1986 Phone: [phone] Reason for Call: Pt is calling to request for a note to be faxed to her employer, [employer], fax # [phone]. She needs something on your letter head stating she may take her zoloft as needed along with her adderall. She needs this info sent to keep her job. She has been placed on probation for one week, due to her medications making her look under the influence at work. Please notify the pt when this has been taken care of. Regarding Appointment: Appt Date: Appt Time: Unknown Provider: Reason:

## 2014-02-16 NOTE — Telephone Encounter (Signed)
Called the patient faxed letter as requested and also put copy of letter in the mail for the patient to have for her records.  Patient informed

## 2014-02-16 NOTE — Telephone Encounter (Signed)
I can write a medical note, but pt will need referral to an ADD specialist as she will need further evaluation for ongoing correct dosing  Note Done hardcopy to robin  Referral done, needs closer f/u on appropriate dose of adderal

## 2014-02-18 ENCOUNTER — Telehealth: Payer: Self-pay | Admitting: *Deleted

## 2014-02-18 NOTE — Telephone Encounter (Signed)
Pt called states she needs the letter rewritten stating she is to take the Adderall while at work and the Zoloft and Klonopin to be taken at home after work hours.  Fax number is [phone]

## 2014-02-18 NOTE — Telephone Encounter (Signed)
Ok - to [employer]

## 2014-02-18 NOTE — Telephone Encounter (Signed)
Letter completed, MD signed and will fax as requested. Called the patient left detailed message ok for letter and faxed.

## 2014-02-23 ENCOUNTER — Other Ambulatory Visit: Payer: Self-pay | Admitting: *Deleted

## 2014-02-23 NOTE — Telephone Encounter (Signed)
Received fax pt is needing PA on her Adderal. Completed PA on [employer]-med[employer] waiting on approval status...Raechel Chute/lmb

## 2014-02-26 ENCOUNTER — Ambulatory Visit (INDEPENDENT_AMBULATORY_CARE_PROVIDER_SITE_OTHER): Payer: BC Managed Care – PPO | Admitting: Internal Medicine

## 2014-02-26 ENCOUNTER — Encounter: Payer: Self-pay | Admitting: Internal Medicine

## 2014-02-26 VITALS — BP 110/68 | HR 109 | Temp 98.7°F | Wt 132.4 lb

## 2014-02-26 DIAGNOSIS — IMO0002 Reserved for concepts with insufficient information to code with codable children: Secondary | ICD-10-CM

## 2014-02-26 DIAGNOSIS — F988 Other specified behavioral and emotional disorders with onset usually occurring in childhood and adolescence: Secondary | ICD-10-CM

## 2014-02-26 DIAGNOSIS — L03211 Cellulitis of face: Secondary | ICD-10-CM

## 2014-02-26 DIAGNOSIS — L0201 Cutaneous abscess of face: Secondary | ICD-10-CM

## 2014-02-26 DIAGNOSIS — G43909 Migraine, unspecified, not intractable, without status migrainosus: Secondary | ICD-10-CM

## 2014-02-26 MED ORDER — CETIRIZINE HCL 10 MG PO TABS: 10.0000 mg | ORAL_TABLET | Freq: Every day | ORAL | Status: DC

## 2014-02-26 MED ORDER — ZOLPIDEM TARTRATE 10 MG PO TABS: 10.0000 mg | ORAL_TABLET | Freq: Every evening | ORAL | Status: DC | PRN

## 2014-02-26 MED ORDER — CLONAZEPAM 1 MG PO TABS: 1.0000 mg | ORAL_TABLET | Freq: Two times a day (BID) | ORAL | Status: DC | PRN

## 2014-02-26 MED ORDER — CEFTRIAXONE SODIUM 1 G IJ SOLR
1.0000 g | Freq: Once | INTRAMUSCULAR | Status: AC
  Administered 2014-02-26: 1 g via INTRAMUSCULAR

## 2014-02-26 MED ORDER — ZOLMITRIPTAN 5 MG NA SOLN: 1.0000 | NASAL | Status: DC | PRN

## 2014-02-26 MED ORDER — AMPHETAMINE-DEXTROAMPHET ER 30 MG PO CP24: 30.0000 mg | ORAL_CAPSULE | Freq: Every day | ORAL | Status: DC

## 2014-02-26 MED ORDER — SULFAMETHOXAZOLE-TRIMETHOPRIM 800-160 MG PO TABS: 1.0000 | ORAL_TABLET | Freq: Two times a day (BID) | ORAL | Status: DC

## 2014-02-26 NOTE — Assessment & Plan Note (Signed)
stable overall by history and exam, and pt to continue medical treatment as before,  to f/u any worsening symptoms or concerns 

## 2014-02-26 NOTE — Patient Instructions (Addendum)
You had the antibiotic shot today  Please take all new medication as prescribed - the pill antibiotic  Please continue all other medications as before, and refills have been done if requested.  Please have the pharmacy call with any other refills you may need.  You will be contacted regarding the referral for: ENT - to see [facility] now

## 2014-02-26 NOTE — Assessment & Plan Note (Signed)
Mod to severe, rapid onset, ? MRSA -like onset, for rocephiin IM now, septra ds po, and refer ENT today- likely needs I&D

## 2014-02-26 NOTE — Progress Notes (Signed)
Subjective:    Patient ID: Lauren Gonzalez, female    DOB: 05-19-1986, 28 y.o.   MRN: [id number]  HPI Here to f/u, with rapidly worsening chin red/tender/swelling (and slight drainage last PM) with feverish feeling, cold at times, and swelling lower lip as well so that the inner lower lip has rubbed a whitish area and hurts.  No prior hx of this or other abscess.  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  ADD meds working well, needs refill, functions ok at home and work.  Denies worsening depressive symptoms, suicidal ideation, or panic; needs klonopin refill.  Has stable migraine HA pattern iwthout increased severity or frequency.  Still with significant onset sleep difficulty, asks for ambien refill as well Past Medical History  Diagnosis Date  . DISORDER, BIPOLAR NEC   . Insomnia, unspecified   . MIGRAINE HEADACHE   . Allergic rhinitis, cause unspecified   . ADD (attention deficit disorder) 12/29/2013   Past Surgical History  Procedure Laterality Date  . Frenulectomy, lingual      of the base of the tongue  . Tympanostomy tube placement      as a child  . Wisdom tooth extraction    . S/p left hip labral tear surgury  dec. 2010  . Cholecystectomy      reports that she has quit smoking. She does not have any smokeless tobacco history on file. She reports that she drinks alcohol. Her drug history is not on file. family history includes Arthritis in her other; Cancer in her other; Colon polyps in her father; Depression in her other; Diabetes in her other; Heart disease in her other; Stroke in her other. No Known Allergies Current Outpatient Prescriptions on File Prior to Visit  Medication Sig Dispense Refill  . etonogestrel-ethinyl estradiol (NUVARING) 0.12-0.015 MG/24HR vaginal ring Place 1 each vaginally every 28 (twenty-eight) days. Insert vaginally and leave in place for 3 consecutive weeks, then remove for 1 week.       Marland Kitchen. ibuprofen (ADVIL,MOTRIN) 800 MG  tablet Take 1 tablet (800 mg total) by mouth every 6 (six) hours as needed.  60 tablet  2  . methylphenidate (CONCERTA) 18 MG CR tablet Take 1 tablet (18 mg total) by mouth daily.  30 tablet  0  . sertraline (ZOLOFT) 100 MG tablet 1 and 1/2 tab by mouth per day  135 tablet  3  . triamcinolone cream (KENALOG) 0.1 % Apply topically 2 (two) times daily.  45 g  0  . topiramate (TOPAMAX) 50 MG tablet Take 1 tablet (50 mg total) by mouth 2 (two) times daily.  60 tablet  5   No current facility-administered medications on file prior to visit.    Review of Systems  Constitutional: Negative for unusual diaphoresis or other sweats  HENT: Negative for ringing in ear Eyes: Negative for double vision or worsening visual disturbance.  Respiratory: Negative for choking and stridor.   Gastrointestinal: Negative for vomiting or other signifcant bowel change Genitourinary: Negative for hematuria or decreased urine volume.  Musculoskeletal: Negative for other MSK pain or swelling Skin: Negative for color change and worsening wound.  Neurological: Negative for tremors and numbness other than noted  Psychiatric/Behavioral: Negative for decreased concentration or agitation other than above       Objective:   Physical Exam BP 110/68  Pulse 109  Temp(Src) 98.7 F (37.1 C) (Oral)  Wt 132 lb 6 oz (60.045 kg)  SpO2 97% VS noted,  Constitutional:  Pt appears well-developed, well-nourished.  HENT: Head: NCAT.  Right Ear: External ear normal.  Left Ear: External ear normal.  Eyes: . Pupils are equal, round, and reactive to light. Conjunctivae and EOM are normal Chin with large area cellulitis approx 5 cm X 3 , with central fluctuant area, whitehead, slight drainage pus Neck: Normal range of motion. Neck supple.  Cardiovascular: Normal rate and regular rhythm.   Pulmonary/Chest: Effort normal and breath sounds normal.  Neurological: Pt is alert. Not confused , motor grossly intact Skin: Skin is warm. No  rash Psychiatric: Pt behavior is normal. No agitation. mild nervous    Assessment & Plan:

## 2014-02-26 NOTE — Progress Notes (Signed)
Pre visit review using our clinic review tool, if applicable. No additional management support is needed unless otherwise documented below in the visit note. 

## 2014-03-08 NOTE — Telephone Encounter (Signed)
Never received PA status. Closing encounter.../lmb 

## 2014-04-29 ENCOUNTER — Ambulatory Visit: Payer: BC Managed Care – PPO | Admitting: Internal Medicine

## 2014-05-14 ENCOUNTER — Encounter: Payer: Self-pay | Admitting: Internal Medicine

## 2014-07-22 ENCOUNTER — Ambulatory Visit (INDEPENDENT_AMBULATORY_CARE_PROVIDER_SITE_OTHER): Payer: BC Managed Care – PPO | Admitting: Internal Medicine

## 2014-07-22 ENCOUNTER — Encounter: Payer: Self-pay | Admitting: Internal Medicine

## 2014-07-22 VITALS — BP 112/78 | HR 106 | Temp 98.3°F | Wt 132.4 lb

## 2014-07-22 DIAGNOSIS — Z23 Encounter for immunization: Secondary | ICD-10-CM

## 2014-07-22 DIAGNOSIS — Z021 Encounter for pre-employment examination: Secondary | ICD-10-CM

## 2014-07-22 DIAGNOSIS — Z0000 Encounter for general adult medical examination without abnormal findings: Secondary | ICD-10-CM

## 2014-07-22 DIAGNOSIS — G4700 Insomnia, unspecified: Secondary | ICD-10-CM

## 2014-07-22 DIAGNOSIS — F419 Anxiety disorder, unspecified: Secondary | ICD-10-CM

## 2014-07-22 MED ORDER — CLONAZEPAM 1 MG PO TABS: 1.0000 mg | ORAL_TABLET | Freq: Two times a day (BID) | ORAL | Status: DC | PRN

## 2014-07-22 MED ORDER — ZOLPIDEM TARTRATE ER 12.5 MG PO TBCR: 12.5000 mg | EXTENDED_RELEASE_TABLET | Freq: Every evening | ORAL | Status: DC | PRN

## 2014-07-22 NOTE — Progress Notes (Signed)
Pre visit review using our clinic review tool, if applicable. No additional management support is needed unless otherwise documented below in the visit note. 

## 2014-07-22 NOTE — Progress Notes (Signed)
Subjective:    Patient ID: Lauren Gonzalez, female    DOB: [birth year], 28 y.o.   MRN: [id number]  HPI  Here for wellness and f/u;  Overall doing ok;  Pt denies CP, worsening SOB, DOE, wheezing, orthopnea, PND, worsening LE edema, palpitations, dizziness or syncope.  Pt denies neurological change such as new headache, facial or extremity weakness.  Pt denies polydipsia, polyuria, or low sugar symptoms. Pt states overall good compliance with treatment and medications, good tolerability, and has been trying to follow lower cholesterol diet.  Pt denies worsening depressive symptoms, suicidal ideation or panic. No fever, night sweats, wt loss, loss of appetite, or other constitutional symptoms.  Pt states good ability with ADL's, has low fall risk, home safety reviewed and adequate, no other significant changes in hearing or vision, and trying to be quite active with reg exercise. 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  Also with ongoing insomnia, but regular ambien 10 not workingas well recently, cant stay asleep all night. Denies worsening depressive symptoms, suicidal ideation, or panic; has ongoing anxiety, for klonopin refill.  ALso on vyvanse/adderall relatively high dose, work and social function improved Past Medical History  Diagnosis Date  . DISORDER, BIPOLAR NEC   . Insomnia, unspecified   . MIGRAINE HEADACHE   . Allergic rhinitis, cause unspecified   . ADD (attention deficit disorder) 12/29/2013   Past Surgical History  Procedure Laterality Date  . Frenulectomy, lingual      of the base of the tongue  . Tympanostomy tube placement      as a child  . Wisdom tooth extraction    . S/p left hip labral tear surgury  dec. 2010  . Cholecystectomy      reports that she has quit smoking. She does not have any smokeless tobacco history on file. She reports that she drinks alcohol. Her drug history is not on file. family history includes Arthritis in her other;  Cancer in her other; Colon polyps in her father; Depression in her other; Diabetes in her other; Heart disease in her other; Stroke in her other. No Known Allergies Current Outpatient Prescriptions on File Prior to Visit  Medication Sig Dispense Refill  . amphetamine-dextroamphetamine (ADDERALL XR) 30 MG 24 hr capsule Take 1 capsule (30 mg total) by mouth daily.  30 capsule  0  . cetirizine (ZYRTEC) 10 MG tablet Take 1 tablet (10 mg total) by mouth daily.  90 tablet  3  . etonogestrel-ethinyl estradiol (NUVARING) 0.12-0.015 MG/24HR vaginal ring Place 1 each vaginally every 28 (twenty-eight) days. Insert vaginally and leave in place for 3 consecutive weeks, then remove for 1 week.       Marland Kitchen. ibuprofen (ADVIL,MOTRIN) 800 MG tablet Take 1 tablet (800 mg total) by mouth every 6 (six) hours as needed.  60 tablet  2  . sertraline (ZOLOFT) 100 MG tablet 1 and 1/2 tab by mouth per day  135 tablet  3  . triamcinolone cream (KENALOG) 0.1 % Apply topically 2 (two) times daily.  45 g  0  . zolmitriptan (ZOMIG) 5 MG nasal solution Place 1 spray into the nose as needed. May repeat once after 2 hours  6 Units  11  . methylphenidate (CONCERTA) 18 MG CR tablet Take 1 tablet (18 mg total) by mouth daily.  30 tablet  0  . topiramate (TOPAMAX) 50 MG tablet Take 1 tablet (50 mg total) by mouth 2 (two) times daily.  60 tablet  5  No current facility-administered medications on file prior to visit.   Review of Systems Constitutional: Negative for increased diaphoresis, other activity, appetite or other siginficant weight change  HENT: Negative for worsening hearing loss, ear pain, facial swelling, mouth sores and neck stiffness.   Eyes: Negative for other worsening pain, redness or visual disturbance.  Respiratory: Negative for shortness of breath and wheezing.   Cardiovascular: Negative for chest pain and palpitations.  Gastrointestinal: Negative for diarrhea, blood in stool, abdominal distention or other  pain Genitourinary: Negative for hematuria, flank pain or change in urine volume.  Musculoskeletal: Negative for myalgias or other joint complaints.  Skin: Negative for color change and wound.  Neurological: Negative for syncope and numbness. other than noted Hematological: Negative for adenopathy. or other swelling Psychiatric/Behavioral: Negative for hallucinations, self-injury, decreased concentration or other worsening agitation.      Objective:   Physical Exam BP 112/78  Pulse 106  Temp(Src) 98.3 F (36.8 C) (Oral)  Wt 132 lb 6 oz (60.045 kg)  SpO2 97% VS noted,  Constitutional: Pt is oriented to person, place, and time. Appears well-developed and well-nourished.  Head: Normocephalic and atraumatic.  Right Ear: External ear normal.  Left Ear: External ear normal.  Nose: Nose normal.  Mouth/Throat: Oropharynx is clear and moist.  Eyes: Conjunctivae and EOM are normal. Pupils are equal, round, and reactive to light.  Neck: Normal range of motion. Neck supple. No JVD present. No tracheal deviation present.  Cardiovascular: Normal rate, regular rhythm, normal heart sounds and intact distal pulses.   Pulmonary/Chest: Effort normal and breath sounds without rales or wheezing  Abdominal: Soft. Bowel sounds are normal. NT. No HSM  Musculoskeletal: Normal range of motion. Exhibits no edema.  Lymphadenopathy:  Has no cervical adenopathy.  Neurological: Pt is alert and oriented to person, place, and time. Pt has normal reflexes. No cranial nerve deficit. Motor grossly intact Skin: Skin is warm and dry. No rash noted.  Psychiatric:  Has normal mood and affect. Behavior is normal.     Assessment & Plan:

## 2014-07-22 NOTE — Patient Instructions (Addendum)
You had the flu shot today  OK to change the Ambien to the CR 12.5 mg as needed  Please continue all other medications as before, and refills have been done if requested - the klonopin  Please have the pharmacy call with any other refills you may need.  Please continue your efforts at being more active, low cholesterol diet, and weight control.  You are otherwise up to date with prevention measures today.  Please keep your appointments with your specialists as you may have planned  Please return for your PPD (TB skin test on Monday Oct 19  Please go to the LAB in the Basement (turn left off the elevator) for the tests to be done at your convenience  You will be contacted by phone if any changes need to be made immediately.  Otherwise, you will receive a letter about your results with an explanation, but please check with MyChart first.  Please remember to sign up for MyChart if you have not done so, as this will be important to you in the future with finding out test results, communicating by private email, and scheduling acute appointments online when needed.  Please return in 1 year for your yearly visit, or sooner if needed

## 2014-07-24 NOTE — Assessment & Plan Note (Addendum)
Also for Hep A and B titers, MMR titer, varicella titer with f/u immunizations as needed, form to be signed, also for PPD placement

## 2014-07-24 NOTE — Assessment & Plan Note (Signed)

## 2014-07-24 NOTE — Assessment & Plan Note (Signed)
Stable, for klonopin refill,  to f/u any worsening symptoms or concerns 

## 2014-07-24 NOTE — Assessment & Plan Note (Signed)
Ok for change to [employer] cr qhs prn

## 2014-07-26 ENCOUNTER — Other Ambulatory Visit (INDEPENDENT_AMBULATORY_CARE_PROVIDER_SITE_OTHER): Payer: BC Managed Care – PPO

## 2014-07-26 ENCOUNTER — Other Ambulatory Visit: Payer: Self-pay | Admitting: Internal Medicine

## 2014-07-26 ENCOUNTER — Ambulatory Visit (INDEPENDENT_AMBULATORY_CARE_PROVIDER_SITE_OTHER): Payer: BC Managed Care – PPO

## 2014-07-26 DIAGNOSIS — Z0000 Encounter for general adult medical examination without abnormal findings: Secondary | ICD-10-CM

## 2014-07-26 DIAGNOSIS — Z0279 Encounter for issue of other medical certificate: Secondary | ICD-10-CM

## 2014-07-26 DIAGNOSIS — Z23 Encounter for immunization: Secondary | ICD-10-CM

## 2014-07-26 LAB — BASIC METABOLIC PANEL
BUN: 12 mg/dL (ref 6–23)
CHLORIDE: 107 meq/L (ref 96–112)
CO2: 25 meq/L (ref 19–32)
CREATININE: 0.8 mg/dL (ref 0.4–1.2)
Calcium: 8.5 mg/dL (ref 8.4–10.5)
GFR: 87.2 mL/min (ref >60.00)
Glucose, Bld: 101 mg/dL — ABNORMAL HIGH (ref 70–99)
Potassium: 4 mEq/L (ref 3.5–5.1)
Sodium: 138 mEq/L (ref 135–145)

## 2014-07-26 LAB — CBC WITH DIFFERENTIAL/PLATELET
BASOS ABS: 0 10*3/uL (ref 0.0–0.1)
BASOS PCT: 0.7 % (ref 0.0–3.0)
Eosinophils Absolute: 0.1 10*3/uL (ref 0.0–0.7)
Eosinophils Relative: 1.6 % (ref 0.0–5.0)
HCT: 36.4 % (ref 36.0–46.0)
Hemoglobin: 12.1 g/dL (ref 12.0–15.0)
Lymphs Abs: 2.7 10*3/uL (ref 0.7–4.0)
MCHC: 33.2 g/dL (ref 30.0–36.0)
MCV: 92.1 fl (ref 78.0–100.0)
Monocytes Absolute: 0.3 10*3/uL (ref 0.1–1.0)
Monocytes Relative: 6.5 % (ref 3.0–12.0)
NEUTROS PCT: 35.1 % — AB (ref 43.0–77.0)
Neutro Abs: 1.7 10*3/uL (ref 1.4–7.7)
Platelets: 243 10*3/uL (ref 150.0–400.0)
RBC: 3.96 Mil/uL (ref 3.87–5.11)
RDW: 12.1 % (ref 11.5–15.5)
WBC: 4.8 10*3/uL (ref 4.0–10.5)

## 2014-07-26 LAB — URINALYSIS, ROUTINE W REFLEX MICROSCOPIC
Bilirubin Urine: NEGATIVE
KETONES UR: NEGATIVE
Nitrite: POSITIVE — AB
Specific Gravity, Urine: 1.02 (ref 1.000–1.030)
Total Protein, Urine: NEGATIVE
Urine Glucose: NEGATIVE
Urobilinogen, UA: 0.2 (ref 0.0–1.0)
pH: 6 (ref 5.0–8.0)

## 2014-07-26 LAB — HEPATIC FUNCTION PANEL
ALT: 11 U/L (ref 0–35)
AST: 20 U/L (ref 0–37)
Albumin: 3.2 g/dL — ABNORMAL LOW (ref 3.5–5.2)
Alkaline Phosphatase: 47 U/L (ref 39–117)
BILIRUBIN DIRECT: 0.1 mg/dL (ref 0.0–0.3)
TOTAL PROTEIN: 7.6 g/dL (ref 6.0–8.3)
Total Bilirubin: 0.6 mg/dL (ref 0.2–1.2)

## 2014-07-26 LAB — LIPID PANEL
Cholesterol: 194 mg/dL (ref 0–200)
HDL: 66.7 mg/dL (ref >39.00)
LDL Cholesterol: 107 mg/dL — ABNORMAL HIGH (ref 0–99)
NONHDL: 127.3
TRIGLYCERIDES: 101 mg/dL (ref 0.0–149.0)
Total CHOL/HDL Ratio: 3
VLDL: 20.2 mg/dL (ref 0.0–40.0)

## 2014-07-26 LAB — TSH: TSH: 0.39 u[IU]/mL (ref 0.35–4.50)

## 2014-07-28 ENCOUNTER — Telehealth: Payer: Self-pay

## 2014-07-28 DIAGNOSIS — Z021 Encounter for pre-employment examination: Secondary | ICD-10-CM

## 2014-07-28 LAB — TB SKIN TEST
INDURATION: 0 mm
TB SKIN TEST: NEGATIVE

## 2014-07-29 NOTE — Telephone Encounter (Signed)
Forwarded to Dr Jonny RuizJohn and Zella Ballobin

## 2014-07-30 NOTE — Telephone Encounter (Signed)
Unfort I am unable to complete the form as the MMR titer, Hep B and A titers, and varicella titer seems to be still pending  Robin to call lab, does pt need re-draw?

## 2014-08-03 NOTE — Telephone Encounter (Signed)
Orders re-done as future

## 2014-08-03 NOTE — Addendum Note (Signed)
Addended by: Corwin LevinsJOHN,  W on: 08/03/2014 06:09 PM   Modules accepted: Orders

## 2014-08-03 NOTE — Telephone Encounter (Signed)
Called the lab and these orders were not put in as future.  The patient needs to come back in and lab will call if ok.  Please Re-order these titers and make sure they are Future.

## 2014-08-04 ENCOUNTER — Ambulatory Visit: Payer: BC Managed Care – PPO | Admitting: *Deleted

## 2014-08-04 ENCOUNTER — Other Ambulatory Visit: Payer: BC Managed Care – PPO

## 2014-08-04 DIAGNOSIS — Z111 Encounter for screening for respiratory tuberculosis: Secondary | ICD-10-CM

## 2014-08-04 DIAGNOSIS — Z021 Encounter for pre-employment examination: Secondary | ICD-10-CM

## 2014-08-04 LAB — HEPATITIS A ANTIBODY, TOTAL: HEP A TOTAL AB: REACTIVE — AB

## 2014-08-04 LAB — HEPATITIS B SURFACE ANTIBODY,QUALITATIVE: HEP B S AB: POSITIVE — AB

## 2014-08-04 LAB — MEASLES/MUMPS/RUBELLA IMMUNITY
RUBELLA: 3.36 {index} — AB (ref <0.90)
Rubeola IgG: 102 AU/mL — ABNORMAL HIGH (ref <25.00)

## 2014-08-04 LAB — VARICELLA ZOSTER ANTIBODY, IGG: VARICELLA IGG: 1729 {index} — AB (ref <135.00)

## 2014-08-04 MED ORDER — TUBERCULIN PPD 5 UNIT/0.1ML ID SOLN
5.0000 [IU] | Freq: Once | INTRADERMAL | Status: AC
  Administered 2014-08-04: 5 [IU] via INTRADERMAL

## 2014-08-04 NOTE — Telephone Encounter (Signed)
Called the lab and confirmed the patient is aware to come back to the lab.

## 2014-08-05 ENCOUNTER — Telehealth: Payer: Self-pay

## 2014-08-05 NOTE — Telephone Encounter (Signed)
Left a message for patient that her paperwork is at the front office for her to pick up when she can. A copy will be scanned to her chart

## 2014-08-06 LAB — INFLUENZA INJ QUAD PF 6+MOS

## 2014-08-06 LAB — TB SKIN TEST
Induration: 0 mm
TB Skin Test: NEGATIVE

## 2014-08-24 ENCOUNTER — Encounter: Payer: BC Managed Care – PPO | Admitting: Internal Medicine

## 2014-10-17 NOTE — Addendum Note (Signed)
Addended by: JOHN, JAMES W on: 10/17/2014 06:42 PM   Modules accepted: SmartSet  

## 2015-11-30 ENCOUNTER — Encounter: Payer: Self-pay | Admitting: Nurse Practitioner

## 2015-11-30 ENCOUNTER — Ambulatory Visit (INDEPENDENT_AMBULATORY_CARE_PROVIDER_SITE_OTHER): Payer: BLUE CROSS/BLUE SHIELD | Admitting: Nurse Practitioner

## 2015-11-30 VITALS — BP 122/78 | HR 88 | Temp 98.8°F | Ht 65.0 in | Wt 168.0 lb

## 2015-11-30 DIAGNOSIS — Z23 Encounter for immunization: Secondary | ICD-10-CM | POA: Insufficient documentation

## 2015-11-30 DIAGNOSIS — J029 Acute pharyngitis, unspecified: Secondary | ICD-10-CM | POA: Insufficient documentation

## 2015-11-30 MED ORDER — AMOXICILLIN-POT CLAVULANATE 875-125 MG PO TABS: 1.0000 | ORAL_TABLET | Freq: Two times a day (BID) | ORAL | Status: DC

## 2015-11-30 NOTE — Progress Notes (Signed)
Patient ID: Lauren Gonzalez, female    DOB: 10/13/1985  Age: 30 y.o. MRN: [id number]  CC: Sore Throat   HPI Lauren Gonzalez presents for CC of ST x 1 day.   1) Left side tonsil Reports stones this morning, but coughed some out Fever Tmax- subjective  Ear fullness on left  ST   Treatment to date:   LMP- 11/07/15 approx lasts for 2 days  Had a miscarriage in 09/2014  History Lauren Gonzalez has a past medical history of DISORDER, BIPOLAR NEC; Insomnia, unspecified; MIGRAINE HEADACHE; Allergic rhinitis, cause unspecified; and ADD (attention deficit disorder) (12/29/2013).   She has past surgical history that includes Frenulectomy, lingual; Tympanostomy tube placement; Wisdom tooth extraction; s/p left hip labral tear surgury (dec. 2010); and Cholecystectomy.   Her family history includes Arthritis in her other; Cancer in her other; Colon polyps in her father; Depression in her other; Diabetes in her other; Heart disease in her other; Stroke in her other.She reports that she has quit smoking. She does not have any smokeless tobacco history on file. She reports that she drinks alcohol. Her drug history is not on file.  Outpatient Prescriptions Prior to Visit  Medication Sig Dispense Refill  . cetirizine (ZYRTEC) 10 MG tablet Take 1 tablet (10 mg total) by mouth daily. 90 tablet 3  . clonazePAM (KLONOPIN) 1 MG tablet Take 1 tablet (1 mg total) by mouth 2 (two) times daily as needed for anxiety. 60 tablet 2  . etonogestrel-ethinyl estradiol (NUVARING) 0.12-0.015 MG/24HR vaginal ring Place 1 each vaginally every 28 (twenty-eight) days. Insert vaginally and leave in place for 3 consecutive weeks, then remove for 1 week.     Marland Kitchen ibuprofen (ADVIL,MOTRIN) 800 MG tablet Take 1 tablet (800 mg total) by mouth every 6 (six) hours as needed. 60 tablet 2  . methylphenidate (CONCERTA) 18 MG CR tablet Take 1 tablet (18 mg total) by mouth daily. 30 tablet 0  . triamcinolone cream (KENALOG) 0.1 % Apply topically 2 (two)  times daily. 45 g 0  . zolmitriptan (ZOMIG) 5 MG nasal solution Place 1 spray into the nose as needed. May repeat once after 2 hours 6 Units 11  . zolpidem (AMBIEN CR) 12.5 MG CR tablet Take 1 tablet (12.5 mg total) by mouth at bedtime as needed for sleep. 30 tablet 5  . amphetamine-dextroamphetamine (ADDERALL XR) 30 MG 24 hr capsule Take 1 capsule (30 mg total) by mouth daily. (Patient not taking: Reported on 11/30/2015) 30 capsule 0  . lisdexamfetamine (VYVANSE) 40 MG capsule Take 40 mg by mouth daily. Reported on 11/30/2015    . sertraline (ZOLOFT) 100 MG tablet 1 and 1/2 tab by mouth per day (Patient not taking: Reported on 11/30/2015) 135 tablet 3  . topiramate (TOPAMAX) 50 MG tablet Take 1 tablet (50 mg total) by mouth 2 (two) times daily. 60 tablet 5   No facility-administered medications prior to visit.    ROS Review of Systems  Constitutional: Positive for fever and fatigue. Negative for chills and diaphoresis.  HENT: Positive for congestion, ear pain and sore throat. Negative for postnasal drip, rhinorrhea, sneezing, tinnitus, trouble swallowing and voice change.   Eyes: Negative for visual disturbance.  Respiratory: Negative for chest tightness, shortness of breath and wheezing.   Cardiovascular: Negative for chest pain, palpitations and leg swelling.  Gastrointestinal: Negative for nausea, vomiting and diarrhea.  Skin: Negative for rash.  Neurological: Negative for dizziness and headaches.    Objective:  BP 122/78 mmHg  Pulse  88  Temp(Src) 98.8 F (37.1 C) (Oral)  Ht  (1.651 m)  Wt 168 lb (76.204 kg)  BMI 27.96 kg/m2  SpO2 97%  Physical Exam  Constitutional: She is oriented to person, place, and time. She appears well-developed and well-nourished. No distress.  HENT:  Head: Normocephalic and atraumatic.  Right Ear: External ear normal.  Left Ear: External ear normal.  Mouth/Throat: No oropharyngeal exudate.  TM's clear bilaterally Crypts seen on left tonsil- no  stones, slightly erythematous   Eyes: EOM are normal. Pupils are equal, round, and reactive to light. Right eye exhibits no discharge. Left eye exhibits no discharge. No scleral icterus.  Neck: Normal range of motion. Neck supple. No thyromegaly present.  Cardiovascular: Normal rate, regular rhythm and normal heart sounds.  Exam reveals no gallop and no friction rub.   No murmur heard. Pulmonary/Chest: Effort normal and breath sounds normal. No respiratory distress. She has no wheezes. She has no rales. She exhibits no tenderness.  Lymphadenopathy:    She has no cervical adenopathy.  Neurological: She is alert and oriented to person, place, and time. No cranial nerve deficit. She exhibits normal muscle tone. Coordination normal.  Skin: Skin is warm and dry. No rash noted. She is not diaphoretic.  Psychiatric: She has a normal mood and affect. Her behavior is normal. Judgment and thought content normal.   Assessment & Plan:   Lauren Gonzalez was seen today for sore throat.  Diagnoses and all orders for this visit:  Flu vaccine need -     Flu Vaccine QUAD 36+ mos IM  Viral pharyngitis  Other orders -     amoxicillin-clavulanate (AUGMENTIN) 875-125 MG tablet; Take 1 tablet by mouth 2 (two) times daily.   I am having Lauren Gonzalez start on amoxicillin-clavulanate. I am also having her maintain her etonogestrel-ethinyl estradiol, topiramate, ibuprofen, methylphenidate, sertraline, triamcinolone cream, cetirizine, amphetamine-dextroamphetamine, zolmitriptan, lisdexamfetamine, clonazePAM, zolpidem, and amphetamine-dextroamphetamine.  Meds ordered this encounter  Medications  . amphetamine-dextroamphetamine (ADDERALL) 10 MG tablet    Sig: TK 1 T PO DAILY    Refill:  0  . amoxicillin-clavulanate (AUGMENTIN) 875-125 MG tablet    Sig: Take 1 tablet by mouth 2 (two) times daily.    Dispense:  14 tablet    Refill:  0    Order Specific Question:  Supervising Provider    Answer:  Sherlene Shams [[id number]]      Follow-up: Return if symptoms worsen or fail to improve.

## 2015-11-30 NOTE — Progress Notes (Signed)
Pre visit review using our clinic review tool, if applicable. No additional management support is needed unless otherwise documented below in the visit note. 

## 2015-11-30 NOTE — Addendum Note (Signed)
Addended by: Carollee Leitz on: 11/30/2015 02:43 PM   Modules accepted: Kipp Brood

## 2015-11-30 NOTE — Patient Instructions (Signed)
Augmentin as prescribed  Gargling with warm salt water   Motrin as needed.

## 2015-11-30 NOTE — Assessment & Plan Note (Signed)
Given today.

## 2015-11-30 NOTE — Assessment & Plan Note (Signed)
Advised OTC measures and Gave Augmentin if worsens to cover for strep.

## 2015-12-01 ENCOUNTER — Telehealth: Payer: Self-pay | Admitting: General Practice

## 2015-12-01 NOTE — Telephone Encounter (Signed)
Patient had flu shot yesterday.  She called to report swelling and redness at the site.  Also reported that it is sore and itching.  No fever.  Per Dr. Jonny Ruiz, patient can take benadryl 50 mg Q6 hours and pt was asked to report any fever.  Patient verbalized understanding.

## 2016-06-05 ENCOUNTER — Ambulatory Visit (INDEPENDENT_AMBULATORY_CARE_PROVIDER_SITE_OTHER): Payer: BLUE CROSS/BLUE SHIELD | Admitting: Internal Medicine

## 2016-06-05 ENCOUNTER — Encounter: Payer: Self-pay | Admitting: Internal Medicine

## 2016-06-05 ENCOUNTER — Other Ambulatory Visit (INDEPENDENT_AMBULATORY_CARE_PROVIDER_SITE_OTHER): Payer: BLUE CROSS/BLUE SHIELD

## 2016-06-05 VITALS — BP 132/78 | HR 94 | Temp 98.0°F | Resp 20 | Wt 171.0 lb

## 2016-06-05 DIAGNOSIS — Z0001 Encounter for general adult medical examination with abnormal findings: Secondary | ICD-10-CM

## 2016-06-05 DIAGNOSIS — R6889 Other general symptoms and signs: Secondary | ICD-10-CM

## 2016-06-05 DIAGNOSIS — R21 Rash and other nonspecific skin eruption: Secondary | ICD-10-CM | POA: Diagnosis not present

## 2016-06-05 LAB — BASIC METABOLIC PANEL
BUN: 11 mg/dL (ref 6–23)
CO2: 26 mEq/L (ref 19–32)
CREATININE: 0.66 mg/dL (ref 0.40–1.20)
Calcium: 8.9 mg/dL (ref 8.4–10.5)
Chloride: 103 mEq/L (ref 96–112)
GFR: 112.11 mL/min (ref >60.00)
Glucose, Bld: 96 mg/dL (ref 70–99)
Potassium: 4 mEq/L (ref 3.5–5.1)
SODIUM: 135 meq/L (ref 135–145)

## 2016-06-05 LAB — CBC WITH DIFFERENTIAL/PLATELET
BASOS ABS: 0.1 10*3/uL (ref 0.0–0.1)
Basophils Relative: 0.6 % (ref 0.0–3.0)
EOS ABS: 0.3 10*3/uL (ref 0.0–0.7)
Eosinophils Relative: 3.2 % (ref 0.0–5.0)
HCT: 39.5 % (ref 36.0–46.0)
Hemoglobin: 13.6 g/dL (ref 12.0–15.0)
LYMPHS ABS: 2.9 10*3/uL (ref 0.7–4.0)
Lymphocytes Relative: 31.4 % (ref 12.0–46.0)
MCHC: 34.3 g/dL (ref 30.0–36.0)
MCV: 88.5 fl (ref 78.0–100.0)
Monocytes Absolute: 0.6 10*3/uL (ref 0.1–1.0)
Monocytes Relative: 6.8 % (ref 3.0–12.0)
NEUTROS ABS: 5.3 10*3/uL (ref 1.4–7.7)
NEUTROS PCT: 58 % (ref 43.0–77.0)
PLATELETS: 288 10*3/uL (ref 150.0–400.0)
RBC: 4.47 Mil/uL (ref 3.87–5.11)
RDW: 12.4 % (ref 11.5–15.5)
WBC: 9.2 10*3/uL (ref 4.0–10.5)

## 2016-06-05 LAB — LIPID PANEL
Cholesterol: 243 mg/dL — ABNORMAL HIGH (ref 0–200)
HDL: 82 mg/dL
LDL Cholesterol: 142 mg/dL — ABNORMAL HIGH (ref 0–99)
NonHDL: 161.36
Total CHOL/HDL Ratio: 3
Triglycerides: 96 mg/dL (ref 0.0–149.0)
VLDL: 19.2 mg/dL (ref 0.0–40.0)

## 2016-06-05 LAB — URINALYSIS, ROUTINE W REFLEX MICROSCOPIC
Bilirubin Urine: NEGATIVE
Ketones, ur: NEGATIVE
Leukocytes, UA: NEGATIVE
Nitrite: NEGATIVE
Specific Gravity, Urine: 1.015
Total Protein, Urine: NEGATIVE
Urine Glucose: NEGATIVE
Urobilinogen, UA: 0.2
pH: 6 (ref 5.0–8.0)

## 2016-06-05 LAB — TSH: TSH: 0.38 u[IU]/mL (ref 0.35–4.50)

## 2016-06-05 LAB — HEPATIC FUNCTION PANEL
ALK PHOS: 60 U/L (ref 39–117)
ALT: 18 U/L (ref 0–35)
AST: 19 U/L (ref 0–37)
Albumin: 4.3 g/dL (ref 3.5–5.2)
BILIRUBIN DIRECT: 0.2 mg/dL (ref 0.0–0.3)
TOTAL PROTEIN: 7.9 g/dL (ref 6.0–8.3)
Total Bilirubin: 1.1 mg/dL (ref 0.2–1.2)

## 2016-06-05 MED ORDER — TRIAMCINOLONE ACETONIDE 0.1 % EX CREA: TOPICAL_CREAM | Freq: Two times a day (BID) | CUTANEOUS | 0 refills | Status: DC

## 2016-06-05 MED ORDER — ZOLPIDEM TARTRATE ER 12.5 MG PO TBCR: 12.5000 mg | EXTENDED_RELEASE_TABLET | Freq: Every evening | ORAL | 5 refills | Status: DC | PRN

## 2016-06-05 MED ORDER — IBUPROFEN 800 MG PO TABS: 800.0000 mg | ORAL_TABLET | Freq: Four times a day (QID) | ORAL | 2 refills | Status: DC | PRN

## 2016-06-05 MED ORDER — CLONAZEPAM 1 MG PO TABS
1.0000 mg | ORAL_TABLET | Freq: Two times a day (BID) | ORAL | 2 refills | Status: DC | PRN
Start: 2016-06-05 — End: 2017-07-01

## 2016-06-05 NOTE — Progress Notes (Signed)
Pre visit review using our clinic review tool, if applicable. No additional management support is needed unless otherwise documented below in the visit note. 

## 2016-06-05 NOTE — Assessment & Plan Note (Signed)

## 2016-06-05 NOTE — Progress Notes (Signed)
Subjective:    Patient ID: Lauren Gonzalez, female    DOB: 24-Jul-1986, 30 y.o.   MRN: [id number]  HPI  Here for wellness and f/u;  Overall doing ok;  Pt denies Chest pain, worsening SOB, DOE, wheezing, orthopnea, PND, worsening LE edema, palpitations, dizziness or syncope.  Pt denies neurological change such as new headache, facial or extremity weakness.  Pt denies polydipsia, polyuria, or low sugar symptoms. Pt states overall good compliance with treatment and medications, good tolerability, and has been trying to follow appropriate diet.  Pt denies worsening depressive symptoms, suicidal ideation or panic. No fever, night sweats, wt loss, loss of appetite, or other constitutional symptoms.  Pt states good ability with ADL's, has low fall risk, home safety reviewed and adequate, no other significant changes in hearing or vision, and active with exercise with gym work, some wts and cardio.  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   Past Medical History:  Diagnosis Date  . ADD (attention deficit disorder) 12/29/2013  . Allergic rhinitis, cause unspecified   . DISORDER, BIPOLAR NEC   . Insomnia, unspecified   . MIGRAINE HEADACHE    Past Surgical History:  Procedure Laterality Date  . CHOLECYSTECTOMY    . FRENULECTOMY, LINGUAL     of the base of the tongue  . s/p left hip labral tear surgury  dec. 2010  . TYMPANOSTOMY TUBE PLACEMENT     as a child  . WISDOM TOOTH EXTRACTION      reports that she has quit smoking. She does not have any smokeless tobacco history on file. She reports that she drinks alcohol. Her drug history is not on file. family history includes Arthritis in her other; Cancer in her other; Colon polyps in her father; Depression in her other; Diabetes in her other; Heart disease in her other; Stroke in her other. Allergies  Allergen Reactions  . Influenza Vaccines Rash   Current Outpatient Prescriptions on File Prior to Visit  Medication Sig Dispense Refill  . cetirizine (ZYRTEC) 10 MG  tablet Take 1 tablet (10 mg total) by mouth daily. 90 tablet 3  . etonogestrel-ethinyl estradiol (NUVARING) 0.12-0.015 MG/24HR vaginal ring Place 1 each vaginally every 28 (twenty-eight) days. Insert vaginally and leave in place for 3 consecutive weeks, then remove for 1 week.     . zolmitriptan (ZOMIG) 5 MG nasal solution Place 1 spray into the nose as needed. May repeat once after 2 hours 6 Units 11   No current facility-administered medications on file prior to visit.    Also has several raised clear mild itchy lesions to numerous areas of the arms, neck and face without obvious allergic contact Past Medical History:  Diagnosis Date  . ADD (attention deficit disorder) 12/29/2013  . Allergic rhinitis, cause unspecified   . DISORDER, BIPOLAR NEC   . Insomnia, unspecified   . MIGRAINE HEADACHE    Past Surgical History:  Procedure Laterality Date  . CHOLECYSTECTOMY    . FRENULECTOMY, LINGUAL     of the base of the tongue  . s/p left hip labral tear surgury  dec. 2010  . TYMPANOSTOMY TUBE PLACEMENT     as a child  . WISDOM TOOTH EXTRACTION      reports that she has quit smoking. She does not have any smokeless tobacco history on file. She reports that she drinks alcohol. Her drug history is not on file. family history includes Arthritis in her other; Cancer in her other; Colon polyps in her father; Depression  in her other; Diabetes in her other; Heart disease in her other; Stroke in her other. Allergies  Allergen Reactions  . Influenza Vaccines Rash   Current Outpatient Prescriptions on File Prior to Visit  Medication Sig Dispense Refill  . cetirizine (ZYRTEC) 10 MG tablet Take 1 tablet (10 mg total) by mouth daily. 90 tablet 3  . etonogestrel-ethinyl estradiol (NUVARING) 0.12-0.015 MG/24HR vaginal ring Place 1 each vaginally every 28 (twenty-eight) days. Insert vaginally and leave in place for 3 consecutive weeks, then remove for 1 week.     . zolmitriptan (ZOMIG) 5 MG nasal solution  Place 1 spray into the nose as needed. May repeat once after 2 hours 6 Units 11   No current facility-administered medications on file prior to visit.    Review of Systems Constitutional: Negative for increased diaphoresis, or other activity, appetite or siginficant weight change other than noted HENT: Negative for worsening hearing loss, ear pain, facial swelling, mouth sores and neck stiffness.   Eyes: Negative for other worsening pain, redness or visual disturbance.  Respiratory: Negative for choking or stridor Cardiovascular: Negative for other chest pain and palpitations.  Gastrointestinal: Negative for worsening diarrhea, blood in stool, or abdominal distention Genitourinary: Negative for hematuria, flank pain or change in urine volume.  Musculoskeletal: Negative for myalgias or other joint complaints.  Skin: Negative for other color change and wound or drainage.  Neurological: Negative for syncope and numbness. other than noted Hematological: Negative for adenopathy. or other swelling Psychiatric/Behavioral: Negative for hallucinations, SI, self-injury, decreased concentration or other worsening agitation.      Objective:   Physical Exam BP 132/78   Pulse 94   Temp 98 F (36.7 C) (Oral)   Resp 20   Wt 171 lb (77.6 kg)   SpO2 98%   BMI 28.46 kg/m  VS noted,  Constitutional: Pt is oriented to person, place, and time. Appears well-developed and well-nourished, in no significant distress Head: Normocephalic and atraumatic  Eyes: Conjunctivae and EOM are normal. Pupils are equal, round, and reactive to light Right Ear: External ear normal.  Left Ear: External ear normal Nose: Nose normal.  Mouth/Throat: Oropharynx is clear and moist  Neck: Normal range of motion. Neck supple. No JVD present. No tracheal deviation present or significant neck LA or mass Cardiovascular: Normal rate, regular rhythm, normal heart sounds and intact distal pulses.   Pulmonary/Chest: Effort normal  and breath sounds without rales or wheezing  Abdominal: Soft. Bowel sounds are normal. NT. No HSM  Musculoskeletal: Normal range of motion. Exhibits no edema Lymphadenopathy: Has no cervical adenopathy.  Neurological: Pt is alert and oriented to person, place, and time. Pt has normal reflexes. No cranial nerve deficit. Motor grossly intact Skin: Skin is warm and dry. + rash with approx 40 small itchy slightly raised lesions noted < 7 mm, nontender, no erythema,several lesions in a possible linear fashion such as with contact dermatitis all to arms and face and neck, none noted to torso or legs , no new ulcers Psychiatric:  Has normal mood and affect. Behavior is normal.     Assessment & Plan:

## 2016-06-05 NOTE — Assessment & Plan Note (Signed)
etiolgy unclear, most likely contact dermatitis, for triam cr prn,  to f/u any worsening symptoms or concerns

## 2016-06-05 NOTE — Patient Instructions (Signed)
Please take all new medication as prescribed - the cream  Please continue all other medications as before, and refills have been done if requested.  Please have the pharmacy call with any other refills you may need.  Please continue your efforts at being more active, low cholesterol diet, and weight control.  You are otherwise up to date with prevention measures today.  Please keep your appointments with your specialists as you may have planned  Please go to the LAB in the Basement (turn left off the elevator) for the tests to be done today  You will be contacted by phone if any changes need to be made immediately.  Otherwise, you will receive a letter about your results with an explanation, but please check with MyChart first.  Please remember to sign up for MyChart if you have not done so, as this will be important to you in the future with finding out test results, communicating by private email, and scheduling acute appointments online when needed.  Please return in 1 year for your yearly visit, or sooner if needed, with Lab testing done 3-5 days before  

## 2016-08-21 ENCOUNTER — Encounter: Payer: Self-pay | Admitting: Nurse Practitioner

## 2016-08-21 ENCOUNTER — Ambulatory Visit (INDEPENDENT_AMBULATORY_CARE_PROVIDER_SITE_OTHER): Payer: BLUE CROSS/BLUE SHIELD | Admitting: Nurse Practitioner

## 2016-08-21 VITALS — BP 138/92 | HR 107 | Temp 97.4°F

## 2016-08-21 DIAGNOSIS — J209 Acute bronchitis, unspecified: Secondary | ICD-10-CM | POA: Diagnosis not present

## 2016-08-21 DIAGNOSIS — J019 Acute sinusitis, unspecified: Secondary | ICD-10-CM | POA: Diagnosis not present

## 2016-08-21 MED ORDER — BENZONATATE 100 MG PO CAPS: 100.0000 mg | ORAL_CAPSULE | Freq: Three times a day (TID) | ORAL | 0 refills | Status: DC | PRN

## 2016-08-21 MED ORDER — AMOXICILLIN-POT CLAVULANATE 875-125 MG PO TABS: 1.0000 | ORAL_TABLET | Freq: Two times a day (BID) | ORAL | 0 refills | Status: DC

## 2016-08-21 MED ORDER — METHYLPREDNISOLONE 4 MG PO TBPK: ORAL_TABLET | ORAL | 0 refills | Status: DC

## 2016-08-21 MED ORDER — GUAIFENESIN ER 600 MG PO TB12: 600.0000 mg | ORAL_TABLET | Freq: Two times a day (BID) | ORAL | 0 refills | Status: DC | PRN

## 2016-08-21 MED ORDER — SALINE SPRAY 0.65 % NA SOLN: 1.0000 | NASAL | 0 refills | Status: DC | PRN

## 2016-08-21 MED ORDER — ALBUTEROL SULFATE HFA 108 (90 BASE) MCG/ACT IN AERS: 2.0000 | INHALATION_SPRAY | Freq: Four times a day (QID) | RESPIRATORY_TRACT | 0 refills | Status: AC | PRN

## 2016-08-21 NOTE — Patient Instructions (Signed)

## 2016-08-21 NOTE — Progress Notes (Signed)
Subjective:  Patient ID: Lauren Gonzalez, female    DOB: 1986/02/27  Age: 30 y.o. MRN: [id number]  CC: Sinusitis (Pt stated having congested, coughing, vomiting, headache and ear pain for about 1 month)   Sinusitis  This is a new problem. The current episode started 1 to 4 weeks ago. The problem has been waxing and waning since onset. The maximum temperature recorded prior to her arrival was 100.4 - 100.9 F. The pain is severe. Associated symptoms include chills, congestion, coughing, shortness of breath, sinus pressure, sneezing, a sore throat and swollen glands. Past treatments include oral decongestants and acetaminophen.    Outpatient Medications Prior to Visit  Medication Sig Dispense Refill  . cetirizine (ZYRTEC) 10 MG tablet Take 1 tablet (10 mg total) by mouth daily. 90 tablet 3  . clonazePAM (KLONOPIN) 1 MG tablet Take 1 tablet (1 mg total) by mouth 2 (two) times daily as needed for anxiety. 60 tablet 2  . etonogestrel-ethinyl estradiol (NUVARING) 0.12-0.015 MG/24HR vaginal ring Place 1 each vaginally every 28 (twenty-eight) days. Insert vaginally and leave in place for 3 consecutive weeks, then remove for 1 week.     Marland Kitchen. ibuprofen (ADVIL,MOTRIN) 800 MG tablet Take 1 tablet (800 mg total) by mouth every 6 (six) hours as needed. 60 tablet 2  . triamcinolone cream (KENALOG) 0.1 % Apply topically 2 (two) times daily. 45 g 0  . zolmitriptan (ZOMIG) 5 MG nasal solution Place 1 spray into the nose as needed. May repeat once after 2 hours 6 Units 11  . zolpidem (AMBIEN CR) 12.5 MG CR tablet Take 1 tablet (12.5 mg total) by mouth at bedtime as needed for sleep. 30 tablet 5   No facility-administered medications prior to visit.     ROS See HPI  Objective:  BP (!) 138/92   Pulse (!) 107   Temp 97.4 F (36.3 C)   SpO2 97%   BP Readings from Last 3 Encounters:  08/21/16 (!) 138/92  06/05/16 132/78  11/30/15 122/78    Wt Readings from Last 3 Encounters:  06/05/16 171 lb (77.6 kg)    11/30/15 168 lb (76.2 kg)  07/22/14 132 lb 6 oz (60 kg)    Physical Exam  Constitutional: She is oriented to person, place, and time.  HENT:  Right Ear: External ear normal. No mastoid tenderness. Tympanic membrane is not injected and not erythematous. A middle ear effusion is present.  Left Ear: External ear normal. No mastoid tenderness. Tympanic membrane is not injected and not erythematous. A middle ear effusion is present.  Nose: Mucosal edema and rhinorrhea present. Right sinus exhibits maxillary sinus tenderness and frontal sinus tenderness. Left sinus exhibits maxillary sinus tenderness and frontal sinus tenderness.  Mouth/Throat: Uvula is midline. Posterior oropharyngeal erythema present. No oropharyngeal exudate.  Eyes: Conjunctivae and EOM are normal. Pupils are equal, round, and reactive to light. No scleral icterus.  Neck: Normal range of motion. Neck supple.  Cardiovascular: Normal rate.   Pulmonary/Chest: Effort normal and breath sounds normal.  Lymphadenopathy:    She has no cervical adenopathy.  Neurological: She is alert and oriented to person, place, and time.    Lab Results  Component Value Date   WBC 9.2 06/05/2016   HGB 13.6 06/05/2016   HCT 39.5 06/05/2016   PLT 288.0 06/05/2016   GLUCOSE 96 06/05/2016   CHOL 243 (H) 06/05/2016   TRIG 96.0 06/05/2016   HDL 82.00 06/05/2016   LDLDIRECT 124.1 10/30/2013   LDLCALC 142 (H) 06/05/2016  ALT 18 06/05/2016   AST 19 06/05/2016   NA 135 06/05/2016   K 4.0 06/05/2016   CL 103 06/05/2016   CREATININE 0.66 06/05/2016   BUN 11 06/05/2016   CO2 26 06/05/2016   TSH 0.38 06/05/2016    No results found.  Assessment & Plan:   Lauren Gonzalez was seen today for sinusitis.  Diagnoses and all orders for this visit:  Acute bronchitis, unspecified organism -     albuterol (PROVENTIL HFA;VENTOLIN HFA) 108 (90 Base) MCG/ACT inhaler; Inhale 2 puffs into the lungs every 6 (six) hours as needed for wheezing or shortness of  breath. -     guaiFENesin (MUCINEX) 600 MG 12 hr tablet; Take 1 tablet (600 mg total) by mouth 2 (two) times daily as needed for cough or to loosen phlegm. -     methylPREDNISolone (MEDROL DOSEPAK) 4 MG TBPK tablet; Take as directed on package -     benzonatate (TESSALON) 100 MG capsule; Take 1 capsule (100 mg total) by mouth 3 (three) times daily as needed for cough. -     sodium chloride (OCEAN) 0.65 % SOLN nasal spray; Place 1 spray into both nostrils as needed for congestion. -     amoxicillin-clavulanate (AUGMENTIN) 875-125 MG tablet; Take 1 tablet by mouth 2 (two) times daily.  Acute non-recurrent sinusitis, unspecified location -     albuterol (PROVENTIL HFA;VENTOLIN HFA) 108 (90 Base) MCG/ACT inhaler; Inhale 2 puffs into the lungs every 6 (six) hours as needed for wheezing or shortness of breath. -     guaiFENesin (MUCINEX) 600 MG 12 hr tablet; Take 1 tablet (600 mg total) by mouth 2 (two) times daily as needed for cough or to loosen phlegm. -     methylPREDNISolone (MEDROL DOSEPAK) 4 MG TBPK tablet; Take as directed on package -     benzonatate (TESSALON) 100 MG capsule; Take 1 capsule (100 mg total) by mouth 3 (three) times daily as needed for cough. -     sodium chloride (OCEAN) 0.65 % SOLN nasal spray; Place 1 spray into both nostrils as needed for congestion. -     amoxicillin-clavulanate (AUGMENTIN) 875-125 MG tablet; Take 1 tablet by mouth 2 (two) times daily.   I am having Ms. Sagun start on albuterol, guaiFENesin, methylPREDNISolone, benzonatate, sodium chloride, and amoxicillin-clavulanate. I am also having her maintain her etonogestrel-ethinyl estradiol, cetirizine, zolmitriptan, clonazePAM, ibuprofen, triamcinolone cream, and zolpidem.  Meds ordered this encounter  Medications  . albuterol (PROVENTIL HFA;VENTOLIN HFA) 108 (90 Base) MCG/ACT inhaler    Sig: Inhale 2 puffs into the lungs every 6 (six) hours as needed for wheezing or shortness of breath.    Dispense:  1 Inhaler      Refill:  0    Order Specific Question:   Supervising Provider    Answer:   Tresa Garter [[id number]]  . guaiFENesin (MUCINEX) 600 MG 12 hr tablet    Sig: Take 1 tablet (600 mg total) by mouth 2 (two) times daily as needed for cough or to loosen phlegm.    Dispense:  14 tablet    Refill:  0    Order Specific Question:   Supervising Provider    Answer:   Tresa Garter [[id number]]  . methylPREDNISolone (MEDROL DOSEPAK) 4 MG TBPK tablet    Sig: Take as directed on package    Dispense:  21 tablet    Refill:  0    Order Specific Question:   Supervising Provider    Answer:  PLOTNIKOV, ALEKSEI V [[id number]]  . benzonatate (TESSALON) 100 MG capsule    Sig: Take 1 capsule (100 mg total) by mouth 3 (three) times daily as needed for cough.    Dispense:  20 capsule    Refill:  0    Order Specific Question:   Supervising Provider    Answer:   Tresa GarterPLOTNIKOV, ALEKSEI V [[id number]]  . sodium chloride (OCEAN) 0.65 % SOLN nasal spray    Sig: Place 1 spray into both nostrils as needed for congestion.    Dispense:  15 mL    Refill:  0    Order Specific Question:   Supervising Provider    Answer:   Tresa GarterPLOTNIKOV, ALEKSEI V [[id number]]  . amoxicillin-clavulanate (AUGMENTIN) 875-125 MG tablet    Sig: Take 1 tablet by mouth 2 (two) times daily.    Dispense:  20 tablet    Refill:  0    Order Specific Question:   Supervising Provider    Answer:   Tresa GarterPLOTNIKOV, ALEKSEI V [[id number]]    Follow-up: Return if symptoms worsen or fail to improve.  Alysia Pennaharlotte , NP

## 2016-08-21 NOTE — Progress Notes (Signed)
Pre visit review using our clinic review tool, if applicable. No additional management support is needed unless otherwise documented below in the visit note. 

## 2016-08-22 ENCOUNTER — Ambulatory Visit: Payer: Self-pay | Admitting: Internal Medicine

## 2016-09-12 ENCOUNTER — Encounter: Payer: Self-pay | Admitting: Internal Medicine

## 2016-09-12 ENCOUNTER — Ambulatory Visit (INDEPENDENT_AMBULATORY_CARE_PROVIDER_SITE_OTHER): Payer: BLUE CROSS/BLUE SHIELD | Admitting: Internal Medicine

## 2016-09-12 VITALS — BP 136/70 | HR 95 | Resp 20 | Wt 172.0 lb

## 2016-09-12 DIAGNOSIS — M546 Pain in thoracic spine: Secondary | ICD-10-CM | POA: Diagnosis not present

## 2016-09-12 DIAGNOSIS — J019 Acute sinusitis, unspecified: Secondary | ICD-10-CM | POA: Diagnosis not present

## 2016-09-12 DIAGNOSIS — M542 Cervicalgia: Secondary | ICD-10-CM | POA: Diagnosis not present

## 2016-09-12 DIAGNOSIS — S060X0D Concussion without loss of consciousness, subsequent encounter: Secondary | ICD-10-CM | POA: Diagnosis not present

## 2016-09-12 DIAGNOSIS — J209 Acute bronchitis, unspecified: Secondary | ICD-10-CM

## 2016-09-12 DIAGNOSIS — S060X0A Concussion without loss of consciousness, initial encounter: Secondary | ICD-10-CM | POA: Insufficient documentation

## 2016-09-12 DIAGNOSIS — J309 Allergic rhinitis, unspecified: Secondary | ICD-10-CM

## 2016-09-12 MED ORDER — METHYLPREDNISOLONE 4 MG PO TBPK: ORAL_TABLET | ORAL | 0 refills | Status: DC

## 2016-09-12 MED ORDER — METHOCARBAMOL 500 MG PO TABS: 500.0000 mg | ORAL_TABLET | Freq: Three times a day (TID) | ORAL | 1 refills | Status: DC | PRN

## 2016-09-12 MED ORDER — AMOXICILLIN-POT CLAVULANATE 875-125 MG PO TABS: 1.0000 | ORAL_TABLET | Freq: Two times a day (BID) | ORAL | 0 refills | Status: DC

## 2016-09-12 NOTE — Patient Instructions (Signed)
Please take all new medication as prescribed - the muscle relaxer, and the antibiotic/steroid medications  Please continue all other medications as before, including the ibuprofen  Please have the pharmacy call with any other refills you may need.  Please keep your appointments with your specialists as you may have planned  You are given the work note  Please go to the XRAY Department in the Basement (go straight as you get off the elevator) for the x-ray testing tomorrow  You will be contacted by phone if any changes need to be made immediately.  Otherwise, you will receive a letter about your results with an explanation, but please check with MyChart first.  Please remember to sign up for MyChart if you have not done so, as this will be important to you in the future with finding out test results, communicating by private email, and scheduling acute appointments online when needed.

## 2016-09-12 NOTE — Progress Notes (Signed)
Pre visit review using our clinic review tool, if applicable. No additional management support is needed unless otherwise documented below in the visit note. 
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
## 2016-09-12 NOTE — Progress Notes (Signed)
Letter done

## 2016-09-13 ENCOUNTER — Ambulatory Visit (INDEPENDENT_AMBULATORY_CARE_PROVIDER_SITE_OTHER)
Admission: RE | Admit: 2016-09-13 | Discharge: 2016-09-13 | Disposition: A | Discharge status: Home / Self Care | Payer: BLUE CROSS/BLUE SHIELD | Source: Ambulatory Visit | Attending: Internal Medicine | Admitting: Internal Medicine

## 2016-09-13 ENCOUNTER — Encounter: Payer: Self-pay | Admitting: Radiology

## 2016-09-13 DIAGNOSIS — M542 Cervicalgia: Secondary | ICD-10-CM

## 2016-09-13 DIAGNOSIS — S299XXA Unspecified injury of thorax, initial encounter: Secondary | ICD-10-CM | POA: Diagnosis not present

## 2016-09-13 DIAGNOSIS — S199XXA Unspecified injury of neck, initial encounter: Secondary | ICD-10-CM | POA: Diagnosis not present

## 2016-09-13 DIAGNOSIS — M546 Pain in thoracic spine: Secondary | ICD-10-CM | POA: Diagnosis not present

## 2016-09-17 NOTE — Assessment & Plan Note (Addendum)
Ok for neck films per pt request, pain control, to f/u any worsening symptoms or concerns; for muscle relaxer prn

## 2016-09-17 NOTE — Assessment & Plan Note (Signed)
With mild worsening likely seasonal flare, for predpac asd,  to f/u any worsening symptoms or concerns

## 2016-09-17 NOTE — Assessment & Plan Note (Signed)
Ok for thoracic films per pt request, pain control,  to f/u any worsening symptoms or concerns

## 2016-09-17 NOTE — Assessment & Plan Note (Signed)
Mild to mod, for antibx course,  to f/u any worsening symptoms or concerns 

## 2016-09-17 NOTE — Assessment & Plan Note (Signed)
Mild persistent symtpoms, for work note as done

## 2016-10-23 ENCOUNTER — Ambulatory Visit (INDEPENDENT_AMBULATORY_CARE_PROVIDER_SITE_OTHER): Payer: BLUE CROSS/BLUE SHIELD | Admitting: Internal Medicine

## 2016-10-23 DIAGNOSIS — J111 Influenza due to unidentified influenza virus with other respiratory manifestations: Secondary | ICD-10-CM | POA: Diagnosis not present

## 2016-10-23 MED ORDER — ONDANSETRON HCL 4 MG PO TABS: 4.0000 mg | ORAL_TABLET | Freq: Three times a day (TID) | ORAL | 0 refills | Status: DC | PRN

## 2016-10-23 MED ORDER — OSELTAMIVIR PHOSPHATE 75 MG PO CAPS: 75.0000 mg | ORAL_CAPSULE | Freq: Two times a day (BID) | ORAL | 0 refills | Status: DC

## 2016-10-23 NOTE — Patient Instructions (Signed)
Please take all new medication as prescribed - the tamiflu, and zofran if needed  You are given the work note  You are allowed to work, but must wear mask at all times through Jan 19  Please continue all other medications as before, and refills have been done if requested.  Please have the pharmacy call with any other refills you may need.  Please keep your appointments with your specialists as you may have planned

## 2016-10-23 NOTE — Progress Notes (Signed)
Pre visit review using our clinic review tool, if applicable. No additional management support is needed unless otherwise documented below in the visit note. 

## 2016-10-23 NOTE — Progress Notes (Signed)
Subjective:    Patient ID: Lauren Gonzalez, female    DOB: 21-Jul-1986, 31 y.o.   MRN: [id number]  HPI  Here with 5 days onset acute n/v/d with several episodes per day each assoc with feeling poorly, low grade fever, occas non prod cough, general weakness, mild dizziness and crampy abd pains but no blood.  Denies worsening reflux, dysphagia.  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  Overall some improved today.  Did not have flu shot this year due to prior allergy.  Denies urinary symptoms such as dysuria, frequency, urgency, flank pain, hematuria or n/v, fever, chills.  Pt denies chest pain, increased sob or doe, wheezing, orthopnea, PND, increased LE swelling, palpitations, dizziness or syncope. Past Medical History:  Diagnosis Date  . ADD (attention deficit disorder) 12/29/2013  . Allergic rhinitis, cause unspecified   . DISORDER, BIPOLAR NEC   . Insomnia, unspecified   . MIGRAINE HEADACHE    Past Surgical History:  Procedure Laterality Date  . CHOLECYSTECTOMY    . FRENULECTOMY, LINGUAL     of the base of the tongue  . s/p left hip labral tear surgury  dec. 2010  . TYMPANOSTOMY TUBE PLACEMENT     as a child  . WISDOM TOOTH EXTRACTION      reports that she has quit smoking. She does not have any smokeless tobacco history on file. She reports that she drinks alcohol. Her drug history is not on file. family history includes Arthritis in her other; Cancer in her other; Colon polyps in her father; Depression in her other; Diabetes in her other; Heart disease in her other; Stroke in her other. Allergies  Allergen Reactions  . Influenza Vaccines Rash   Current Outpatient Prescriptions on File Prior to Visit  Medication Sig Dispense Refill  . albuterol (PROVENTIL HFA;VENTOLIN HFA) 108 (90 Base) MCG/ACT inhaler Inhale 2 puffs into the lungs every 6 (six) hours as needed for wheezing or shortness of breath. 1 Inhaler 0  . cetirizine (ZYRTEC) 10 MG tablet Take  1 tablet (10 mg total) by mouth daily. 90 tablet 3  . clonazePAM (KLONOPIN) 1 MG tablet Take 1 tablet (1 mg total) by mouth 2 (two) times daily as needed for anxiety. 60 tablet 2  . etonogestrel-ethinyl estradiol (NUVARING) 0.12-0.015 MG/24HR vaginal ring Place 1 each vaginally every 28 (twenty-eight) days. Insert vaginally and leave in place for 3 consecutive weeks, then remove for 1 week.     Marland Kitchen. guaiFENesin (MUCINEX) 600 MG 12 hr tablet Take 1 tablet (600 mg total) by mouth 2 (two) times daily as needed for cough or to loosen phlegm. 14 tablet 0  . ibuprofen (ADVIL,MOTRIN) 800 MG tablet Take 1 tablet (800 mg total) by mouth every 6 (six) hours as needed. 60 tablet 2  . methocarbamol (ROBAXIN) 500 MG tablet Take 1 tablet (500 mg total) by mouth 3 (three) times daily as needed for muscle spasms. 30 tablet 1  . sodium chloride (OCEAN) 0.65 % SOLN nasal spray Place 1 spray into both nostrils as needed for congestion. 15 mL 0  . triamcinolone cream (KENALOG) 0.1 % Apply topically 2 (two) times daily. 45 g 0  . zolmitriptan (ZOMIG) 5 MG nasal solution Place 1 spray into the nose as needed. May repeat once after 2 hours 6 Units 11  . zolpidem (AMBIEN CR) 12.5 MG CR tablet Take 1 tablet (12.5 mg total) by mouth at bedtime as needed for sleep. 30 tablet  5  . amoxicillin-clavulanate (AUGMENTIN) 875-125 MG tablet Take 1 tablet by mouth 2 (two) times daily. (Patient not taking: Reported on 10/23/2016) 20 tablet 0  . benzonatate (TESSALON) 100 MG capsule Take 1 capsule (100 mg total) by mouth 3 (three) times daily as needed for cough. (Patient not taking: Reported on 10/23/2016) 20 capsule 0  . methylPREDNISolone (MEDROL DOSEPAK) 4 MG TBPK tablet Take as directed on package (Patient not taking: Reported on 10/23/2016) 21 tablet 0   No current facility-administered medications on file prior to visit.    Review of Systems All otherwise neg per pt     Objective:   Physical Exam BP 130/70   Pulse 100   Temp 98  F (36.7 C) (Oral)   Resp 20   SpO2 98%  VS noted, mild ill, wearing mask Constitutional: Pt appears in no apparent distress HENT: Head: NCAT.  Right Ear: External ear normal.  Left Ear: External ear normal.  Eyes: . Pupils are equal, round, and reactive to light. Conjunctivae and EOM are normal Neck: Normal range of motion. Neck supple.  Cardiovascular: Normal rate and regular rhythm.   Pulmonary/Chest: Effort normal and breath sounds without rales or wheezing.  Abd:  Soft, ND, + BS , with diffuse mid abd tender, no guarding or rebound Neurological: Pt is alert. Not confused , motor grossly intact Skin: Skin is warm. No rash, no LE edema Psychiatric: Pt behavior is normal. No agitation.     Influenza - +    Assessment & Plan:

## 2016-10-23 NOTE — Assessment & Plan Note (Signed)
5 days illness without significant cough, or resp symptoms but with GI predominant complaints now improving, no evidence significant dehydration; for tamiflu though may be less effective than at < 3 days, zofran prn, and immodium otc prn loose stools as well.  Gave work note as well for time missed.

## 2017-01-10 ENCOUNTER — Ambulatory Visit (INDEPENDENT_AMBULATORY_CARE_PROVIDER_SITE_OTHER): Payer: BLUE CROSS/BLUE SHIELD | Admitting: Internal Medicine

## 2017-01-10 ENCOUNTER — Encounter: Payer: Self-pay | Admitting: Internal Medicine

## 2017-01-10 VITALS — BP 130/86 | HR 125 | Temp 98.4°F | Ht 65.0 in | Wt 176.0 lb

## 2017-01-10 DIAGNOSIS — R112 Nausea with vomiting, unspecified: Secondary | ICD-10-CM | POA: Insufficient documentation

## 2017-01-10 DIAGNOSIS — R000 Tachycardia, unspecified: Secondary | ICD-10-CM | POA: Diagnosis not present

## 2017-01-10 DIAGNOSIS — B349 Viral infection, unspecified: Secondary | ICD-10-CM | POA: Diagnosis not present

## 2017-01-10 DIAGNOSIS — K920 Hematemesis: Secondary | ICD-10-CM | POA: Diagnosis not present

## 2017-01-10 DIAGNOSIS — R197 Diarrhea, unspecified: Secondary | ICD-10-CM | POA: Diagnosis not present

## 2017-01-10 MED ORDER — ETONOGESTREL-ETHINYL ESTRADIOL 0.12-0.015 MG/24HR VA RING: VAGINAL_RING | VAGINAL | 0 refills | Status: DC

## 2017-01-10 MED ORDER — PROMETHAZINE HCL 25 MG PO TABS: 25.0000 mg | ORAL_TABLET | Freq: Three times a day (TID) | ORAL | 0 refills | Status: DC | PRN

## 2017-01-10 MED ORDER — OSELTAMIVIR PHOSPHATE 75 MG PO CAPS: 75.0000 mg | ORAL_CAPSULE | Freq: Two times a day (BID) | ORAL | 0 refills | Status: DC

## 2017-01-10 NOTE — Patient Instructions (Addendum)
You the Phenergan 25 mg shot today  Please take all new medication as prescribed - the tamiflu  Please go now to ER for IVF's and blood count due to the elevated Heart rate, dehydration, and Red spots in the vomiting  Please continue all other medications as before, and refills have been done if requested.  Please have the pharmacy call with any other refills you may need.  Please keep your appointments with your specialists as you may have planned

## 2017-01-10 NOTE — Progress Notes (Signed)
Pre visit review using our clinic review tool, if applicable. No additional management support is needed unless otherwise documented below in the visit note. 

## 2017-01-10 NOTE — Progress Notes (Signed)
Subjective:    Patient ID: Lauren Gonzalez, female    DOB: Dec 18, 1985, 31 y.o.   MRN: [id number]  HPI  Here to f/u with acute onset n/v/d and aches all over with feverish feeling since yesterday, last able to take po about 1630 yesterday, and could not sleep due to recurrent symptoms every 30-45 min, and now today with elevated HR, feeling dizzy, weak (but no falls). Did at one point have "red spots" to the vomitus but mostly Dry heaves or bilious like to her, and  Did not tolerate a zofran ODT she had previously at home.  Unable to currently take po. Asks for phenergan since this has worked better and husband is with her to drive and deal with any sedation.  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  No other sick contacts, or recent travel Past Medical History:  Diagnosis Date  . ADD (attention deficit disorder) 12/29/2013  . Allergic rhinitis, cause unspecified   . DISORDER, BIPOLAR NEC   . Insomnia, unspecified   . MIGRAINE HEADACHE    Past Surgical History:  Procedure Laterality Date  . CHOLECYSTECTOMY    . FRENULECTOMY, LINGUAL     of the base of the tongue  . s/p left hip labral tear surgury  dec. 2010  . TYMPANOSTOMY TUBE PLACEMENT     as a child  . WISDOM TOOTH EXTRACTION      reports that she has quit smoking. She has never used smokeless tobacco. She reports that she drinks alcohol. Her drug history is not on file. family history includes Arthritis in her other; Cancer in her other; Colon polyps in her father; Depression in her other; Diabetes in her other; Heart disease in her other; Stroke in her other. Allergies  Allergen Reactions  . Influenza Vaccines Rash   Current Outpatient Prescriptions on File Prior to Visit  Medication Sig Dispense Refill  . albuterol (PROVENTIL HFA;VENTOLIN HFA) 108 (90 Base) MCG/ACT inhaler Inhale 2 puffs into the lungs every 6 (six) hours as needed for wheezing  or shortness of breath. 1 Inhaler 0  . cetirizine (ZYRTEC) 10 MG tablet Take 1 tablet (10 mg total) by mouth daily. 90 tablet 3  . clonazePAM (KLONOPIN) 1 MG tablet Take 1 tablet (1 mg total) by mouth 2 (two) times daily as needed for anxiety. 60 tablet 2  . guaiFENesin (MUCINEX) 600 MG 12 hr tablet Take 1 tablet (600 mg total) by mouth 2 (two) times daily as needed for cough or to loosen phlegm. 14 tablet 0  . ibuprofen (ADVIL,MOTRIN) 800 MG tablet Take 1 tablet (800 mg total) by mouth every 6 (six) hours as needed. 60 tablet 2  . methocarbamol (ROBAXIN) 500 MG tablet Take 1 tablet (500 mg total) by mouth 3 (three) times daily as needed for muscle spasms. 30 tablet 1  . triamcinolone cream (KENALOG) 0.1 % Apply topically 2 (two) times daily. 45 g 0  . zolmitriptan (ZOMIG) 5 MG nasal solution Place 1 spray into the nose as needed. May repeat once after 2 hours 6 Units 11  . zolpidem (AMBIEN CR) 12.5 MG CR tablet Take 1 tablet (12.5 mg total) by mouth at bedtime as needed for sleep. 30 tablet 5   No current facility-administered medications on file prior to visit.    Review of Systems  All otherwise neg per pt    Objective:   Physical Exam  BP 130/86   Pulse (!) 125   Temp 98.4 F (36.9 C) (Oral)   Ht  (1.651 m)   Wt 176 lb (79.8 kg)   LMP 12/10/2016   SpO2 98%   BMI 29.29 kg/m  VS noted, mild to mod ill leaning over trash can due to nausea Constitutional: Pt appears mild to mod ill , o/w well nourished, fatigued and weak HENT: Head: NCAT.  Right Ear: External ear normal.  Left Ear: External ear normal.  Eyes: . Pupils are equal, round, and reactive to light. Conjunctivae and EOM are normal Nose: without d/c or deformity Neck: Neck supple. Gross normal ROM Cardiovascular: tachycardic rate and regular rhythm.   Pulmonary/Chest: Effort normal and breath sounds without rales or wheezing.  Abd:  Soft, ND, + BS, no organomegaly with diffuse tender Neurological: Pt is alert. At  baseline orientation, motor grossly intact Skin: Skin is warm. No rashes, other new lesions, no LE edema Psychiatric: Pt behavior is normal without agitation  No other exam findings  ECG today I have personally interpreted Sinus  Tachycardia - 114     Assessment & Plan:

## 2017-01-10 NOTE — Assessment & Plan Note (Signed)
c/w influenza like, for tamiflu course

## 2017-01-10 NOTE — Assessment & Plan Note (Signed)
s/p phenergan 25 mg IM x 1, cont home ODT zofran prn

## 2017-01-10 NOTE — Assessment & Plan Note (Signed)
Likely due to low volume and illness, no high fever at this time, surprisingly fast development overnight but did have marked n/v/d; advised to ED for IVF's

## 2017-01-10 NOTE — Assessment & Plan Note (Signed)
Minor to pt/very small volume, liekly due to retching, but should ideally have cbc done stat

## 2017-01-10 NOTE — Assessment & Plan Note (Signed)
Likely viral illness related, for immodium prn, work note given as well

## 2017-02-27 DIAGNOSIS — Z3202 Encounter for pregnancy test, result negative: Secondary | ICD-10-CM | POA: Diagnosis not present

## 2017-02-27 DIAGNOSIS — N951 Menopausal and female climacteric states: Secondary | ICD-10-CM | POA: Diagnosis not present

## 2017-02-27 DIAGNOSIS — Z113 Encounter for screening for infections with a predominantly sexual mode of transmission: Secondary | ICD-10-CM | POA: Diagnosis not present

## 2017-02-27 DIAGNOSIS — R87610 Atypical squamous cells of undetermined significance on cytologic smear of cervix (ASC-US): Secondary | ICD-10-CM | POA: Diagnosis not present

## 2017-02-27 DIAGNOSIS — R102 Pelvic and perineal pain: Secondary | ICD-10-CM | POA: Diagnosis not present

## 2017-02-27 DIAGNOSIS — Z124 Encounter for screening for malignant neoplasm of cervix: Secondary | ICD-10-CM | POA: Diagnosis not present

## 2017-02-27 DIAGNOSIS — Z1151 Encounter for screening for human papillomavirus (HPV): Secondary | ICD-10-CM | POA: Diagnosis not present

## 2017-02-27 DIAGNOSIS — R232 Flushing: Secondary | ICD-10-CM | POA: Diagnosis not present

## 2017-02-27 DIAGNOSIS — Z01419 Encounter for gynecological examination (general) (routine) without abnormal findings: Secondary | ICD-10-CM | POA: Diagnosis not present

## 2017-02-27 DIAGNOSIS — Z6831 Body mass index (BMI) 31.0-31.9, adult: Secondary | ICD-10-CM | POA: Diagnosis not present

## 2017-06-27 ENCOUNTER — Encounter: Payer: BLUE CROSS/BLUE SHIELD | Admitting: Internal Medicine

## 2017-07-01 ENCOUNTER — Other Ambulatory Visit (INDEPENDENT_AMBULATORY_CARE_PROVIDER_SITE_OTHER): Payer: BLUE CROSS/BLUE SHIELD

## 2017-07-01 ENCOUNTER — Other Ambulatory Visit: Payer: Self-pay | Admitting: Internal Medicine

## 2017-07-01 ENCOUNTER — Encounter: Payer: Self-pay | Admitting: Nurse Practitioner

## 2017-07-01 ENCOUNTER — Ambulatory Visit (INDEPENDENT_AMBULATORY_CARE_PROVIDER_SITE_OTHER): Payer: BLUE CROSS/BLUE SHIELD | Admitting: Nurse Practitioner

## 2017-07-01 ENCOUNTER — Telehealth: Payer: Self-pay | Admitting: Nurse Practitioner

## 2017-07-01 VITALS — BP 128/82 | HR 95 | Temp 98.1°F | Ht 65.0 in | Wt 185.0 lb

## 2017-07-01 DIAGNOSIS — G4700 Insomnia, unspecified: Secondary | ICD-10-CM | POA: Diagnosis not present

## 2017-07-01 DIAGNOSIS — E782 Mixed hyperlipidemia: Secondary | ICD-10-CM | POA: Insufficient documentation

## 2017-07-01 DIAGNOSIS — F419 Anxiety disorder, unspecified: Secondary | ICD-10-CM

## 2017-07-01 DIAGNOSIS — M542 Cervicalgia: Secondary | ICD-10-CM | POA: Diagnosis not present

## 2017-07-01 DIAGNOSIS — Z13220 Encounter for screening for lipoid disorders: Secondary | ICD-10-CM | POA: Diagnosis not present

## 2017-07-01 DIAGNOSIS — Z136 Encounter for screening for cardiovascular disorders: Secondary | ICD-10-CM

## 2017-07-01 DIAGNOSIS — Z0000 Encounter for general adult medical examination without abnormal findings: Secondary | ICD-10-CM | POA: Diagnosis not present

## 2017-07-01 DIAGNOSIS — Z114 Encounter for screening for human immunodeficiency virus [HIV]: Secondary | ICD-10-CM

## 2017-07-01 LAB — COMPREHENSIVE METABOLIC PANEL
ALBUMIN: 4.2 g/dL (ref 3.5–5.2)
ALT: 10 U/L (ref 0–35)
AST: 14 U/L (ref 0–37)
Alkaline Phosphatase: 53 U/L (ref 39–117)
BUN: 9 mg/dL (ref 6–23)
CO2: 21 mEq/L (ref 19–32)
Calcium: 9.2 mg/dL (ref 8.4–10.5)
Chloride: 104 mEq/L (ref 96–112)
Creatinine, Ser: 0.69 mg/dL (ref 0.40–1.20)
GFR: 105.74 mL/min (ref >60.00)
Glucose, Bld: 94 mg/dL (ref 70–99)
POTASSIUM: 3.8 meq/L (ref 3.5–5.1)
SODIUM: 136 meq/L (ref 135–145)
Total Bilirubin: 0.8 mg/dL (ref 0.2–1.2)
Total Protein: 7.8 g/dL (ref 6.0–8.3)

## 2017-07-01 LAB — LIPID PANEL
CHOL/HDL RATIO: 3
Cholesterol: 226 mg/dL — ABNORMAL HIGH (ref 0–200)
HDL: 77.5 mg/dL (ref >39.00)
LDL Cholesterol: 117 mg/dL — ABNORMAL HIGH (ref 0–99)
NONHDL: 148.44
Triglycerides: 159 mg/dL — ABNORMAL HIGH (ref 0.0–149.0)
VLDL: 31.8 mg/dL (ref 0.0–40.0)

## 2017-07-01 LAB — CBC
HEMATOCRIT: 39.6 % (ref 36.0–46.0)
HEMOGLOBIN: 13.4 g/dL (ref 12.0–15.0)
MCHC: 33.9 g/dL (ref 30.0–36.0)
MCV: 91.7 fl (ref 78.0–100.0)
Platelets: 310 10*3/uL (ref 150.0–400.0)
RBC: 4.32 Mil/uL (ref 3.87–5.11)
RDW: 12.5 % (ref 11.5–15.5)
WBC: 6.7 10*3/uL (ref 4.0–10.5)

## 2017-07-01 LAB — HIV ANTIBODY (ROUTINE TESTING W REFLEX): HIV 1&2 Ab, 4th Generation: NONREACTIVE

## 2017-07-01 LAB — TSH: TSH: 1.24 u[IU]/mL (ref 0.35–4.50)

## 2017-07-01 MED ORDER — METHOCARBAMOL 500 MG PO TABS: 500.0000 mg | ORAL_TABLET | Freq: Three times a day (TID) | ORAL | 0 refills | Status: DC | PRN

## 2017-07-01 MED ORDER — IBUPROFEN 800 MG PO TABS: 800.0000 mg | ORAL_TABLET | Freq: Four times a day (QID) | ORAL | 0 refills | Status: DC | PRN

## 2017-07-01 MED ORDER — CLONAZEPAM 1 MG PO TABS
1.0000 mg | ORAL_TABLET | Freq: Two times a day (BID) | ORAL | 0 refills | Status: DC | PRN
Start: 2017-07-01 — End: 2018-11-13

## 2017-07-01 MED ORDER — ZOLPIDEM TARTRATE ER 12.5 MG PO TBCR: 12.5000 mg | EXTENDED_RELEASE_TABLET | Freq: Every evening | ORAL | 0 refills | Status: DC | PRN

## 2017-07-01 NOTE — Patient Instructions (Addendum)
Request additional medication refill from Dr. Jenny Reichmann.  Negative HIV. Normal CMP, TSH, and CBC. Lipid panel indicates persistent elevation in cholesterol, triglyceride, and LDL. I strongly encourage heart healthy diet and regular exercise outside of work schedule. F/up with pcp in 19month for repeat lipid panel (fasting).  Form will be complete and faxed once we get lab results.  Have recent pap smear report faxed to this office.  Health Maintenance, Female Adopting a healthy lifestyle and getting preventive care can go a long way to promote health and wellness. Talk with your health care provider about what schedule of regular examinations is right for you. This is a good chance for you to check in with your provider about disease prevention and staying healthy. In between checkups, there are plenty of things you can do on your own. Experts have done a lot of research about which lifestyle changes and preventive measures are most likely to keep you healthy. Ask your health care provider for more information. Weight and diet Eat a healthy diet  Be sure to include plenty of vegetables, fruits, low-fat dairy products, and lean protein.  Do not eat a lot of foods high in solid fats, added sugars, or salt.  Get regular exercise. This is one of the most important things you can do for your health. ? Most adults should exercise for at least 150 minutes each week. The exercise should increase your heart rate and make you sweat (moderate-intensity exercise). ? Most adults should also do strengthening exercises at least twice a week. This is in addition to the moderate-intensity exercise.  Maintain a healthy weight  Body mass index (BMI) is a measurement that can be used to identify possible weight problems. It estimates body fat based on height and weight. Your health care provider can help determine your BMI and help you achieve or maintain a healthy weight.  For females 232years of age and  older: ? A BMI below 18.5 is considered underweight. ? A BMI of 18.5 to 24.9 is normal. ? A BMI of 25 to 29.9 is considered overweight. ? A BMI of 30 and above is considered obese.  Watch levels of cholesterol and blood lipids  You should start having your blood tested for lipids and cholesterol at 30years of age, then have this test every 5 years.  You may need to have your cholesterol levels checked more often if: ? Your lipid or cholesterol levels are high. ? You are older than 30years of age. ? You are at high risk for heart disease.  Cancer screening Lung Cancer  Lung cancer screening is recommended for adults [id number] old who are at high risk for lung cancer because of a history of smoking.  A yearly low-dose CT scan of the lungs is recommended for people who: ? Currently smoke. ? Have quit within the past 15 years. ? Have at least a 30-pack-year history of smoking. A pack year is smoking an average of one pack of cigarettes a day for 1 year.  Yearly screening should continue until it has been 15 years since you quit.  Yearly screening should stop if you develop a health problem that would prevent you from having lung cancer treatment.  Breast Cancer  Practice breast self-awareness. This means understanding how your breasts normally appear and feel.  It also means doing regular breast self-exams. Let your health care provider know about any changes, no matter how small.  If you are in your 233sor  14s, you should have a clinical breast exam (CBE) by a health care provider every 1-3 years as part of a regular health exam.  If you are 55 or older, have a CBE every year. Also consider having a breast X-ray (mammogram) every year.  If you have a family history of breast cancer, talk to your health care provider about genetic screening.  If you are at high risk for breast cancer, talk to your health care provider about having an MRI and a mammogram every year.  Breast  cancer gene (BRCA) assessment is recommended for women who have family members with BRCA-related cancers. BRCA-related cancers include: ? Breast. ? Ovarian. ? Tubal. ? Peritoneal cancers.  Results of the assessment will determine the need for genetic counseling and BRCA1 and BRCA2 testing.  Cervical Cancer Your health care provider may recommend that you be screened regularly for cancer of the pelvic organs (ovaries, uterus, and vagina). This screening involves a pelvic examination, including checking for microscopic changes to the surface of your cervix (Pap test). You may be encouraged to have this screening done every 3 years, beginning at age 35.  For women ages 79-65, health care providers may recommend pelvic exams and Pap testing every 3 years, or they may recommend the Pap and pelvic exam, combined with testing for human papilloma virus (HPV), every 5 years. Some types of HPV increase your risk of cervical cancer. Testing for HPV may also be done on women of any age with unclear Pap test results.  Other health care providers may not recommend any screening for nonpregnant women who are considered low risk for pelvic cancer and who do not have symptoms. Ask your health care provider if a screening pelvic exam is right for you.  If you have had past treatment for cervical cancer or a condition that could lead to cancer, you need Pap tests and screening for cancer for at least 20 years after your treatment. If Pap tests have been discontinued, your risk factors (such as having a new sexual partner) need to be reassessed to determine if screening should resume. Some women have medical problems that increase the chance of getting cervical cancer. In these cases, your health care provider may recommend more frequent screening and Pap tests.  Colorectal Cancer  This type of cancer can be detected and often prevented.  Routine colorectal cancer screening usually begins at 30 years of age and  continues through 30 years of age.  Your health care provider may recommend screening at an earlier age if you have risk factors for colon cancer.  Your health care provider may also recommend using home test kits to check for hidden blood in the stool.  A small camera at the end of a tube can be used to examine your colon directly (sigmoidoscopy or colonoscopy). This is done to check for the earliest forms of colorectal cancer.  Routine screening usually begins at age 64.  Direct examination of the colon should be repeated every 5-10 years through 30 years of age. However, you may need to be screened more often if early forms of precancerous polyps or small growths are found.  Skin Cancer  Check your skin from head to toe regularly.  Tell your health care provider about any new moles or changes in moles, especially if there is a change in a mole's shape or color.  Also tell your health care provider if you have a mole that is larger than the size of a pencil  eraser.  Always use sunscreen. Apply sunscreen liberally and repeatedly throughout the day.  Protect yourself by wearing long sleeves, pants, a wide-brimmed hat, and sunglasses whenever you are outside.  Heart disease, diabetes, and high blood pressure  High blood pressure causes heart disease and increases the risk of stroke. High blood pressure is more likely to develop in: ? People who have blood pressure in the high end of the normal range (130-139/85-89 mm Hg). ? People who are overweight or obese. ? People who are African American.  If you are 31-15 years of age, have your blood pressure checked every 3-5 years. If you are 51 years of age or older, have your blood pressure checked every year. You should have your blood pressure measured twice-once when you are at a hospital or clinic, and once when you are not at a hospital or clinic. Record the average of the two measurements. To check your blood pressure when you are not  at a hospital or clinic, you can use: ? An automated blood pressure machine at a pharmacy. ? A home blood pressure monitor.  If you are between 64 years and 23 years old, ask your health care provider if you should take aspirin to prevent strokes.  Have regular diabetes screenings. This involves taking a blood sample to check your fasting blood sugar level. ? If you are at a normal weight and have a low risk for diabetes, have this test once every three years after 30 years of age. ? If you are overweight and have a high risk for diabetes, consider being tested at a younger age or more often. Preventing infection Hepatitis B  If you have a higher risk for hepatitis B, you should be screened for this virus. You are considered at high risk for hepatitis B if: ? You were born in a country where hepatitis B is common. Ask your health care provider which countries are considered high risk. ? Your parents were born in a high-risk country, and you have not been immunized against hepatitis B (hepatitis B vaccine). ? You have HIV or AIDS. ? You use needles to inject street drugs. ? You live with someone who has hepatitis B. ? You have had sex with someone who has hepatitis B. ? You get hemodialysis treatment. ? You take certain medicines for conditions, including cancer, organ transplantation, and autoimmune conditions.  Hepatitis C  Blood testing is recommended for: ? Everyone born from [birth year] through 1964. ? Anyone with known risk factors for hepatitis C.  Sexually transmitted infections (STIs)  You should be screened for sexually transmitted infections (STIs) including gonorrhea and chlamydia if: ? You are sexually active and are younger than 30 years of age. ? You are older than 30 years of age and your health care provider tells you that you are at risk for this type of infection. ? Your sexual activity has changed since you were last screened and you are at an increased risk for chlamydia  or gonorrhea. Ask your health care provider if you are at risk.  If you do not have HIV, but are at risk, it may be recommended that you take a prescription medicine daily to prevent HIV infection. This is called pre-exposure prophylaxis (PrEP). You are considered at risk if: ? You are sexually active and do not regularly use condoms or know the HIV status of your partner(s). ? You take drugs by injection. ? You are sexually active with a partner who has HIV.  Talk with your health care provider about whether you are at high risk of being infected with HIV. If you choose to begin PrEP, you should first be tested for HIV. You should then be tested every 3 months for as long as you are taking PrEP. Pregnancy  If you are premenopausal and you may become pregnant, ask your health care provider about preconception counseling.  If you may become pregnant, take 400 to 800 micrograms (mcg) of folic acid every day.  If you want to prevent pregnancy, talk to your health care provider about birth control (contraception). Osteoporosis and menopause  Osteoporosis is a disease in which the bones lose minerals and strength with aging. This can result in serious bone fractures. Your risk for osteoporosis can be identified using a bone density scan.  If you are 73 years of age or older, or if you are at risk for osteoporosis and fractures, ask your health care provider if you should be screened.  Ask your health care provider whether you should take a calcium or vitamin D supplement to lower your risk for osteoporosis.  Menopause may have certain physical symptoms and risks.  Hormone replacement therapy may reduce some of these symptoms and risks. Talk to your health care provider about whether hormone replacement therapy is right for you. Follow these instructions at home:  Schedule regular health, dental, and eye exams.  Stay current with your immunizations.  Do not use any tobacco products  including cigarettes, chewing tobacco, or electronic cigarettes.  If you are pregnant, do not drink alcohol.  If you are breastfeeding, limit how much and how often you drink alcohol.  Limit alcohol intake to no more than 1 drink per day for nonpregnant women. One drink equals 12 ounces of beer, 5 ounces of wine, or 1 ounces of hard liquor.  Do not use street drugs.  Do not share needles.  Ask your health care provider for help if you need support or information about quitting drugs.  Tell your health care provider if you often feel depressed.  Tell your health care provider if you have ever been abused or do not feel safe at home. This information is not intended to replace advice given to you by your health care provider. Make sure you discuss any questions you have with your health care provider. Document Released: 04/09/2011 Document Revised: 03/01/2016 Document Reviewed: 06/28/2015 Elsevier Interactive Patient Education  [employer].

## 2017-07-01 NOTE — Telephone Encounter (Signed)
Please have pcp advise about promethazine refill

## 2017-07-01 NOTE — Progress Notes (Signed)
Subjective:    Patient ID: Lauren Gonzalez, female    DOB: 15-Jul-1986, 31 y.o.   MRN: [id number]  Patient presents today for complete physical   HPI  Denies any acute complains.  Needs medications refilled  Anxiety: Use of klonopin as needed.  Insominia: Use of ambien as needed.  Neck pain: Chronic, intermittent. Related to repetitive movement at current job. Relief with use of ibuprofen and naproxen prn.  Immunizations: (TDAP, Hep C screen, Pneumovax, Influenza, zoster)  Health Maintenance  Topic Date Due  . HIV Screening  11/18/2001  . Pap Smear  09/21/2016  . Flu Shot  06/09/2018*  . Tetanus Vaccine  08/26/2022  *Topic was postponed. The date shown is not the original due date.   Diet:regular.  Weight:  Wt Readings from Last 3 Encounters:  07/01/17 185 lb (83.9 kg)  01/10/17 176 lb (79.8 kg)  09/12/16 172 lb (78 kg)    Exercise: stays active at work (walking and lifting patient)  Fall Risk:slipped on rug at home. No injury Fall Risk  07/01/2017 06/05/2016 10/30/2013  Falls in the past year? Yes Yes Yes  Number falls in past yr: Injury with Fall? (No Data) No No  Comment accident - tripped   Depression/Suicide: Depression screen [facility] 2/9 07/01/2017 06/05/2016 10/30/2013  Decreased Interest 0 0 0  Down, Depressed, Hopeless 0 0 1  PHQ - 2 Score 0 0 1   Pap Smear (every 42yrs for >21-29 without HPV, every 59yrs for >30-59yrs with HPV):GYN: Dr. Freada Bergeron. Last OV seen 2018 per patient, PAP done and normal per patient.  Vision:done annually, use oc corrective lens.  Dental:done every 6months.  Sexual History (birth control, marital status, STD):sexually active with use of nuvaring.  Medications and allergies reviewed with patient and updated if appropriate.  Patient Active Problem List   Diagnosis Date Noted  . Mixed hyperlipidemia 07/01/2017  . Nausea & vomiting 01/10/2017  . Hematemesis 01/10/2017  . Diarrhea 01/10/2017  . Viral illness 01/10/2017  .  Tachycardia 01/10/2017  . Influenza 10/23/2016  . Concussion without loss of consciousness 09/12/2016  . Neck pain 09/12/2016  . Thoracic back pain 09/12/2016  . Rash and nonspecific skin eruption 06/05/2016  . Flu vaccine need 11/30/2015  . Viral pharyngitis 11/30/2015  . Pre-employment examination 07/22/2014  . Abscess or cellulitis of chin 02/26/2014  . ADD (attention deficit disorder) 12/29/2013  . Lack of concentration 10/30/2013  . Allergic rhinitis 07/28/2013  . Amenorrhea 07/28/2013  . Anxiety 08/26/2012  . Encounter for well adult exam with abnormal findings 05/31/2011  . HEMORRHOIDS, INTERNAL 05/10/2008  . DISORDER, BIPOLAR NEC 07/05/2007  . MIGRAINE HEADACHE 07/05/2007  . Insomnia 07/05/2007    Current Outpatient Prescriptions on File Prior to Visit  Medication Sig Dispense Refill  . albuterol (PROVENTIL HFA;VENTOLIN HFA) 108 (90 Base) MCG/ACT inhaler Inhale 2 puffs into the lungs every 6 (six) hours as needed for wheezing or shortness of breath. 1 Inhaler 0  . cetirizine (ZYRTEC) 10 MG tablet Take 1 tablet (10 mg total) by mouth daily. 90 tablet 3  . etonogestrel-ethinyl estradiol (NUVARING) 0.12-0.015 MG/24HR vaginal ring Place 1 each vaginally every 28 (twenty-eight) days. Insert vaginally and leave in place for 3 consecutive weeks, then remove for 1 week. 1 each 0  . guaiFENesin (MUCINEX) 600 MG 12 hr tablet Take 1 tablet (600 mg total) by mouth 2 (two) times daily as needed for cough or to loosen phlegm. 14 tablet 0  . oseltamivir (  TAMIFLU) 75 MG capsule Take 1 capsule (75 mg total) by mouth 2 (two) times daily. 10 capsule 0  . triamcinolone cream (KENALOG) 0.1 % Apply topically 2 (two) times daily. 45 g 0  . zolmitriptan (ZOMIG) 5 MG nasal solution Place 1 spray into the nose as needed. May repeat once after 2 hours 6 Units 11   No current facility-administered medications on file prior to visit.     Past Medical History:  Diagnosis Date  . ADD (attention deficit  disorder) 12/29/2013  . Allergic rhinitis, cause unspecified   . DISORDER, BIPOLAR NEC   . Insomnia, unspecified   . MIGRAINE HEADACHE     Past Surgical History:  Procedure Laterality Date  . CHOLECYSTECTOMY    . FRENULECTOMY, LINGUAL     of the base of the tongue  . s/p left hip labral tear surgury  dec. 2010  . TYMPANOSTOMY TUBE PLACEMENT     as a child  . WISDOM TOOTH EXTRACTION      Social History   Social History  . Marital status: Single    Spouse name: N/A  . Number of children: N/A  . Years of education: N/A   Occupational History  . Med. surg program at [employer]   Social History Main Topics  . Smoking status: Former Games developer  . Smokeless tobacco: Never Used  . Alcohol use Yes     Comment: occasion  . Drug use: Unknown  . Sexual activity: Not Asked   Other Topics Concern  . None   Social History Narrative  . None    Family History  Problem Relation Age of Onset  . Colon polyps Father   . Depression Other   . Diabetes Other   . Stroke Other   . Heart disease Other   . Cancer Other        lung and ovarian cancer  . Arthritis Other         Review of Systems  Constitutional: Negative for fever, malaise/fatigue and weight loss.  HENT: Negative for congestion and sore throat.   Eyes:       Negative for visual changes  Respiratory: Negative for cough and shortness of breath.   Cardiovascular: Negative for chest pain, palpitations and leg swelling.  Gastrointestinal: Negative for blood in stool, constipation, diarrhea and heartburn.  Genitourinary: Negative for dysuria, frequency and urgency.  Musculoskeletal: Negative for falls, joint pain and myalgias.  Skin: Negative for rash.  Neurological: Negative for dizziness, sensory change and headaches.  Endo/Heme/Allergies: Does not bruise/bleed easily.  Psychiatric/Behavioral: Negative for depression, substance abuse and suicidal ideas. The patient is not nervous/anxious.      Objective:   Vitals:   07/01/17 1029  BP: 128/82  Pulse: 95  Temp: 98.1 F (36.7 C)  SpO2: 98%    Body mass index is 30.79 kg/m.   Physical Examination:  Physical Exam  Constitutional: She is oriented to person, place, and time and well-developed, well-nourished, and in no distress. No distress.  HENT:  Right Ear: External ear normal.  Left Ear: External ear normal.  Nose: Nose normal.  Mouth/Throat: Oropharynx is clear and moist. No oropharyngeal exudate.  Eyes: Pupils are equal, round, and reactive to light. Conjunctivae and EOM are normal. No scleral icterus.  Neck: Normal range of motion. Neck supple. No thyromegaly present.  Cardiovascular: Normal rate, normal heart sounds and intact distal pulses.   Pulmonary/Chest: Effort normal and breath sounds normal. She exhibits no tenderness.  Abdominal: Soft. Bowel sounds are normal. She exhibits no distension. There is no tenderness.  Genitourinary:  Genitourinary Comments: Deferred breast and pelvic exam to GYN  Musculoskeletal: Normal range of motion. She exhibits no edema or tenderness.  Lymphadenopathy:    She has no cervical adenopathy.  Neurological: She is alert and oriented to person, place, and time. Gait normal.  Skin: Skin is warm and dry.  Psychiatric: Affect and judgment normal.    ASSESSMENT and PLAN:  Zophia was seen today for annual exam.  Diagnoses and all orders for this visit:  Encounter for preventative adult health care examination -     Comprehensive metabolic panel; Future -     CBC; Future -     TSH; Future -     Lipid panel; Future -     HIV antibody; Future  Encounter for screening for human immunodeficiency virus (HIV) -     HIV antibody; Future  Anxiety -     clonazePAM (KLONOPIN) 1 MG tablet; Take 1 tablet (1 mg total) by mouth 2 (two) times daily as needed for anxiety.  Insomnia, unspecified type -     zolpidem (AMBIEN CR) 12.5 MG CR tablet; Take 1 tablet (12.5 mg total) by  mouth at bedtime as needed for sleep.  Mixed hyperlipidemia -     Lipid panel; Future  Neck pain -     ibuprofen (ADVIL,MOTRIN) 800 MG tablet; Take 1 tablet (800 mg total) by mouth every 6 (six) hours as needed. -     methocarbamol (ROBAXIN) 500 MG tablet; Take 1 tablet (500 mg total) by mouth 3 (three) times daily as needed for muscle spasms.   No problem-specific Assessment & Plan notes found for this encounter.     Follow up: Return in about 6 months (around 12/29/2017) for with Dr. Jonny Ruiz.  Alysia Penna, NP

## 2017-07-01 NOTE — Telephone Encounter (Signed)
Pt called stating she needs her promethazine (PHENERGAN) 25 MG tablet Called in as well.  Please advise

## 2017-07-01 NOTE — Telephone Encounter (Signed)
Refill already done. 

## 2017-12-02 ENCOUNTER — Ambulatory Visit (INDEPENDENT_AMBULATORY_CARE_PROVIDER_SITE_OTHER)
Admission: RE | Admit: 2017-12-02 | Discharge: 2017-12-02 | Disposition: A | Discharge status: Home / Self Care | Payer: BLUE CROSS/BLUE SHIELD | Source: Ambulatory Visit | Attending: Internal Medicine | Admitting: Internal Medicine

## 2017-12-02 ENCOUNTER — Encounter: Payer: Self-pay | Admitting: Internal Medicine

## 2017-12-02 ENCOUNTER — Ambulatory Visit: Payer: BLUE CROSS/BLUE SHIELD | Admitting: Internal Medicine

## 2017-12-02 VITALS — BP 128/88 | HR 105 | Temp 98.3°F | Ht 65.0 in | Wt 191.0 lb

## 2017-12-02 DIAGNOSIS — R05 Cough: Secondary | ICD-10-CM | POA: Diagnosis not present

## 2017-12-02 DIAGNOSIS — R059 Cough, unspecified: Secondary | ICD-10-CM

## 2017-12-02 DIAGNOSIS — G4700 Insomnia, unspecified: Secondary | ICD-10-CM | POA: Diagnosis not present

## 2017-12-02 DIAGNOSIS — J069 Acute upper respiratory infection, unspecified: Secondary | ICD-10-CM | POA: Diagnosis not present

## 2017-12-02 DIAGNOSIS — R252 Cramp and spasm: Secondary | ICD-10-CM | POA: Diagnosis not present

## 2017-12-02 DIAGNOSIS — J3501 Chronic tonsillitis: Secondary | ICD-10-CM | POA: Diagnosis not present

## 2017-12-02 MED ORDER — LEVOFLOXACIN 500 MG PO TABS: 500.0000 mg | ORAL_TABLET | Freq: Every day | ORAL | 0 refills | Status: AC

## 2017-12-02 MED ORDER — ZOLPIDEM TARTRATE ER 12.5 MG PO TBCR: 12.5000 mg | EXTENDED_RELEASE_TABLET | Freq: Every evening | ORAL | 1 refills | Status: DC | PRN

## 2017-12-02 MED ORDER — CYCLOBENZAPRINE HCL 5 MG PO TABS: 5.0000 mg | ORAL_TABLET | Freq: Three times a day (TID) | ORAL | 1 refills | Status: DC | PRN

## 2017-12-02 NOTE — Assessment & Plan Note (Signed)
Mild to mod, for antibx course,  to f/u any worsening symptoms or concerns 

## 2017-12-02 NOTE — Patient Instructions (Addendum)
Please take all new medication as prescribed- the antibiotic and flexeril for muscle cramps  Please continue all other medications as before, and refills have been done if requested - the ambien  Please have the pharmacy call with any other refills you may need.  Please keep your appointments with your specialists as you may have planned  You will be contacted regarding the referral for: ENT  Please go to the XRAY Department in the Basement (go straight as you get off the elevator) for the x-ray testing  You will be contacted by phone if any changes need to be made immediately.  Otherwise, you will receive a letter about your results with an explanation, but please check with MyChart first.  Please remember to sign up for MyChart if you have not done so, as this will be important to you in the future with finding out test results, communicating by private email, and scheduling acute appointments online when needed.

## 2017-12-02 NOTE — Assessment & Plan Note (Signed)
Ok for ent referral 

## 2017-12-02 NOTE — Assessment & Plan Note (Signed)
Mild to mod persistent, for ambien refill

## 2017-12-02 NOTE — Assessment & Plan Note (Signed)
Also for cxr,  to f/u any worsening symptoms or concerns 

## 2017-12-02 NOTE — Assessment & Plan Note (Signed)
Ok for flexeril prn,  to f/u any worsening symptoms or concerns 

## 2017-12-02 NOTE — Progress Notes (Signed)
Subjective:    Patient ID: Lauren Gonzalez, female    DOB: [birth year], 32 y.o.   MRN: [id number]  HPI   Here with 2-3 days acute onset fever, facial pain, pressure, headache, general weakness and malaise, and greenish d/c, with mild ST and cough, but pt denies chest pain, wheezing, increased sob or doe, orthopnea, PND, increased LE swelling, palpitations, dizziness or syncope.  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  Also has chronic tonsil stones recurrent, asks for ENT referral.  Also with persistent insomnia, Denies worsening depressive symptoms, suicidal ideation, or panic; asks for med refill.  Also having a mild persistent cough non prod in last 2 mo, asks for cxr Past Medical History:  Diagnosis Date  . ADD (attention deficit disorder) 12/29/2013  . Allergic rhinitis, cause unspecified   . DISORDER, BIPOLAR NEC   . Insomnia, unspecified   . MIGRAINE HEADACHE    Past Surgical History:  Procedure Laterality Date  . CHOLECYSTECTOMY    . FRENULECTOMY, LINGUAL     of the base of the tongue  . s/p left hip labral tear surgury  dec. 2010  . TYMPANOSTOMY TUBE PLACEMENT     as a child  . WISDOM TOOTH EXTRACTION      reports that she has quit smoking. she has never used smokeless tobacco. She reports that she drinks alcohol. Her drug history is not on file. family history includes Arthritis in her other; Cancer in her other; Colon polyps in her father; Depression in her other; Diabetes in her other; Heart disease in her other; Stroke in her other. Allergies  Allergen Reactions  . Influenza Vaccines Rash   Current Outpatient Medications on File Prior to Visit  Medication Sig Dispense Refill  . albuterol (PROVENTIL HFA;VENTOLIN HFA) 108 (90 Base) MCG/ACT inhaler Inhale 2 puffs into the lungs every 6 (six) hours as needed for wheezing or shortness of breath. 1 Inhaler 0    . cetirizine (ZYRTEC) 10 MG tablet Take 1 tablet (10 mg total) by mouth daily. 90 tablet 3  . clonazePAM (KLONOPIN) 1 MG tablet Take 1 tablet (1 mg total) by mouth 2 (two) times daily as needed for anxiety. 60 tablet 0  . etonogestrel-ethinyl estradiol (NUVARING) 0.12-0.015 MG/24HR vaginal ring Place 1 each vaginally every 28 (twenty-eight) days. Insert vaginally and leave in place for 3 consecutive weeks, then remove for 1 week. 1 each 0  . ibuprofen (ADVIL,MOTRIN) 800 MG tablet Take 1 tablet (800 mg total) by mouth every 6 (six) hours as needed. 60 tablet 0  . methocarbamol (ROBAXIN) 500 MG tablet Take 1 tablet (500 mg total) by mouth 3 (three) times daily as needed for muscle spasms. 30 tablet 0  . triamcinolone cream (KENALOG) 0.1 % Apply topically 2 (two) times daily. 45 g 0  . zolmitriptan (ZOMIG) 5 MG nasal solution Place 1 spray into the nose as needed. May repeat once after 2 hours 6 Units 11   No current facility-administered medications on file prior to visit.    Review of Systems  Constitutional: Negative for other unusual diaphoresis or sweats HENT: Negative for ear discharge or swelling Eyes: Negative for other worsening visual disturbances Respiratory: Negative for stridor or other swelling  Gastrointestinal: Negative for worsening distension or other blood Genitourinary: Negative for retention or other urinary change Musculoskeletal: Negative for other MSK pain or swelling Skin: Negative  for color change or other new lesions Neurological: Negative for worsening tremors and other numbness  Psychiatric/Behavioral: Negative for worsening agitation or other fatigue All other system neg per pt    Objective:   Physical Exam BP 128/88   Pulse (!) 105   Temp 98.3 F (36.8 C) (Oral)   Ht 5\' 5"  (1.651 m)   Wt 191 lb (86.6 kg)   LMP 11/04/2017   SpO2 98%   BMI 31.78 kg/m  VS noted, mild ill Constitutional: Pt appears in NAD HENT: Head: NCAT.  Right Ear: External ear  normal.  Left Ear: External ear normal.  Eyes: . Pupils are equal, round, and reactive to light. Conjunctivae and EOM are normal Bilat tm's with mild erythema.  Max sinus areas mild tender.  Pharynx with mild erythema, no exudate  Neck: Neck supple. Gross normal ROM Cardiovascular: Normal rate and regular rhythm.   Pulmonary/Chest: Effort normal and breath sounds without rales or wheezing.  Abd:  Soft, NT, ND, + BS, no organomegaly Neurological: Pt is alert. At baseline orientation, motor grossly intact Skin: Skin is warm. No rashes, other new lesions, no LE edema Psychiatric: Pt behavior is normal without agitation  No other exam findings    Assessment & Plan:

## 2017-12-12 DIAGNOSIS — J3501 Chronic tonsillitis: Secondary | ICD-10-CM | POA: Diagnosis not present

## 2017-12-12 DIAGNOSIS — J358 Other chronic diseases of tonsils and adenoids: Secondary | ICD-10-CM | POA: Diagnosis not present

## 2017-12-17 DIAGNOSIS — Z3201 Encounter for pregnancy test, result positive: Secondary | ICD-10-CM | POA: Diagnosis not present

## 2017-12-17 DIAGNOSIS — N911 Secondary amenorrhea: Secondary | ICD-10-CM | POA: Diagnosis not present

## 2018-03-08 DIAGNOSIS — I82409 Acute embolism and thrombosis of unspecified deep veins of unspecified lower extremity: Secondary | ICD-10-CM | POA: Insufficient documentation

## 2018-03-17 ENCOUNTER — Other Ambulatory Visit: Payer: Self-pay | Admitting: Otolaryngology

## 2018-03-17 DIAGNOSIS — J3501 Chronic tonsillitis: Secondary | ICD-10-CM | POA: Diagnosis not present

## 2018-03-17 DIAGNOSIS — J358 Other chronic diseases of tonsils and adenoids: Secondary | ICD-10-CM | POA: Diagnosis not present

## 2018-03-17 DIAGNOSIS — J351 Hypertrophy of tonsils: Secondary | ICD-10-CM | POA: Diagnosis not present

## 2018-03-22 ENCOUNTER — Emergency Department (HOSPITAL_COMMUNITY): Payer: BLUE CROSS/BLUE SHIELD | Admitting: Certified Registered"

## 2018-03-22 ENCOUNTER — Ambulatory Visit (HOSPITAL_COMMUNITY)
Admission: EM | Admit: 2018-03-22 | Discharge: 2018-03-22 | Disposition: A | Discharge status: Home / Self Care | Payer: BLUE CROSS/BLUE SHIELD | Attending: Emergency Medicine | Admitting: Emergency Medicine

## 2018-03-22 ENCOUNTER — Encounter (HOSPITAL_COMMUNITY): Payer: Self-pay | Admitting: Emergency Medicine

## 2018-03-22 ENCOUNTER — Other Ambulatory Visit: Payer: Self-pay

## 2018-03-22 ENCOUNTER — Encounter (HOSPITAL_COMMUNITY)
Admission: EM | Disposition: A | Discharge status: Home / Self Care | Payer: Self-pay | Source: Home / Self Care | Attending: Emergency Medicine

## 2018-03-22 DIAGNOSIS — E876 Hypokalemia: Secondary | ICD-10-CM | POA: Diagnosis not present

## 2018-03-22 DIAGNOSIS — G43909 Migraine, unspecified, not intractable, without status migrainosus: Secondary | ICD-10-CM | POA: Insufficient documentation

## 2018-03-22 DIAGNOSIS — D500 Iron deficiency anemia secondary to blood loss (chronic): Secondary | ICD-10-CM | POA: Diagnosis not present

## 2018-03-22 DIAGNOSIS — K648 Other hemorrhoids: Secondary | ICD-10-CM | POA: Diagnosis not present

## 2018-03-22 DIAGNOSIS — Z79899 Other long term (current) drug therapy: Secondary | ICD-10-CM | POA: Diagnosis not present

## 2018-03-22 DIAGNOSIS — F319 Bipolar disorder, unspecified: Secondary | ICD-10-CM | POA: Insufficient documentation

## 2018-03-22 DIAGNOSIS — Y836 Removal of other organ (partial) (total) as the cause of abnormal reaction of the patient, or of later complication, without mention of misadventure at the time of the procedure: Secondary | ICD-10-CM | POA: Diagnosis not present

## 2018-03-22 DIAGNOSIS — Z881 Allergy status to other antibiotic agents status: Secondary | ICD-10-CM | POA: Insufficient documentation

## 2018-03-22 DIAGNOSIS — Z887 Allergy status to serum and vaccine status: Secondary | ICD-10-CM | POA: Insufficient documentation

## 2018-03-22 DIAGNOSIS — F988 Other specified behavioral and emotional disorders with onset usually occurring in childhood and adolescence: Secondary | ICD-10-CM | POA: Diagnosis not present

## 2018-03-22 DIAGNOSIS — J3501 Chronic tonsillitis: Secondary | ICD-10-CM | POA: Diagnosis not present

## 2018-03-22 DIAGNOSIS — Z87891 Personal history of nicotine dependence: Secondary | ICD-10-CM | POA: Insufficient documentation

## 2018-03-22 DIAGNOSIS — J069 Acute upper respiratory infection, unspecified: Secondary | ICD-10-CM | POA: Diagnosis not present

## 2018-03-22 DIAGNOSIS — J358 Other chronic diseases of tonsils and adenoids: Secondary | ICD-10-CM

## 2018-03-22 DIAGNOSIS — J95830 Postprocedural hemorrhage and hematoma of a respiratory system organ or structure following a respiratory system procedure: Secondary | ICD-10-CM | POA: Diagnosis not present

## 2018-03-22 DIAGNOSIS — D62 Acute posthemorrhagic anemia: Secondary | ICD-10-CM

## 2018-03-22 HISTORY — PX: TONSILLECTOMY: SHX5217

## 2018-03-22 LAB — CBC WITH DIFFERENTIAL/PLATELET
ABS IMMATURE GRANULOCYTES: 0.1 10*3/uL (ref 0.0–0.1)
BASOS ABS: 0.1 10*3/uL (ref 0.0–0.1)
BASOS PCT: 1 %
EOS ABS: 0.4 10*3/uL (ref 0.0–0.7)
Eosinophils Relative: 3 %
HCT: 34.8 % — ABNORMAL LOW (ref 36.0–46.0)
Hemoglobin: 11.4 g/dL — ABNORMAL LOW (ref 12.0–15.0)
IMMATURE GRANULOCYTES: 1 %
Lymphocytes Relative: 37 %
Lymphs Abs: 4.8 10*3/uL — ABNORMAL HIGH (ref 0.7–4.0)
MCH: 30.6 pg (ref 26.0–34.0)
MCHC: 32.8 g/dL (ref 30.0–36.0)
MCV: 93.3 fL (ref 78.0–100.0)
Monocytes Absolute: 0.9 10*3/uL (ref 0.1–1.0)
Monocytes Relative: 7 %
NEUTROS ABS: 6.7 10*3/uL (ref 1.7–7.7)
NEUTROS PCT: 51 %
PLATELETS: 375 10*3/uL (ref 150–400)
RBC: 3.73 MIL/uL — ABNORMAL LOW (ref 3.87–5.11)
RDW: 11.8 % (ref 11.5–15.5)
WBC: 12.8 10*3/uL — ABNORMAL HIGH (ref 4.0–10.5)

## 2018-03-22 LAB — TYPE AND SCREEN
ABO/RH(D): O NEG
Antibody Screen: NEGATIVE

## 2018-03-22 LAB — BASIC METABOLIC PANEL
Anion gap: 11 (ref 5–15)
BUN: 9 mg/dL (ref 6–20)
CALCIUM: 8.6 mg/dL — AB (ref 8.9–10.3)
CHLORIDE: 104 mmol/L (ref 101–111)
CO2: 21 mmol/L — AB (ref 22–32)
CREATININE: 0.92 mg/dL (ref 0.44–1.00)
GFR calc Af Amer: >60 mL/min (ref >60)
GFR calc non Af Amer: >60 mL/min (ref >60)
GLUCOSE: 160 mg/dL — AB (ref 65–99)
Potassium: 3.1 mmol/L — ABNORMAL LOW (ref 3.5–5.1)
Sodium: 136 mmol/L (ref 135–145)

## 2018-03-22 LAB — ABO/RH: ABO/RH(D): O NEG

## 2018-03-22 SURGERY — TONSILLECTOMY
Anesthesia: General | Site: Mouth

## 2018-03-22 MED ORDER — FENTANYL CITRATE (PF) 100 MCG/2ML IJ SOLN
INTRAMUSCULAR | Status: DC | PRN
  Administered 2018-03-22: 100 ug via INTRAVENOUS
  Administered 2018-03-22: 50 ug via INTRAVENOUS

## 2018-03-22 MED ORDER — SODIUM CHLORIDE 0.9 % IV BOLUS
1000.0000 mL | Freq: Once | INTRAVENOUS | Status: AC
  Administered 2018-03-22: 1000 mL via INTRAVENOUS

## 2018-03-22 MED ORDER — MIDAZOLAM HCL 2 MG/2ML IJ SOLN
INTRAMUSCULAR | Status: AC
Start: 2018-03-22 — End: ?
  Filled 2018-03-22: qty 2

## 2018-03-22 MED ORDER — HYDROCODONE-ACETAMINOPHEN 7.5-325 MG/15ML PO SOLN: 15.0000 mL | Freq: Four times a day (QID) | ORAL | 0 refills | Status: AC | PRN

## 2018-03-22 MED ORDER — PROPOFOL 10 MG/ML IV BOLUS
INTRAVENOUS | Status: AC
  Filled 2018-03-22: qty 20

## 2018-03-22 MED ORDER — MIDAZOLAM HCL 5 MG/5ML IJ SOLN
INTRAMUSCULAR | Status: DC | PRN
  Administered 2018-03-22: 2 mg via INTRAVENOUS

## 2018-03-22 MED ORDER — FENTANYL CITRATE (PF) 250 MCG/5ML IJ SOLN
INTRAMUSCULAR | Status: AC
  Filled 2018-03-22: qty 5

## 2018-03-22 MED ORDER — PROMETHAZINE HCL 12.5 MG RE SUPP: 12.5000 mg | Freq: Four times a day (QID) | RECTAL | 0 refills | Status: DC | PRN

## 2018-03-22 MED ORDER — LIDOCAINE HCL (CARDIAC) PF 100 MG/5ML IV SOSY
PREFILLED_SYRINGE | INTRAVENOUS | Status: DC | PRN
  Administered 2018-03-22: 80 mg via INTRAVENOUS

## 2018-03-22 MED ORDER — DEXAMETHASONE SODIUM PHOSPHATE 10 MG/ML IJ SOLN
INTRAMUSCULAR | Status: DC | PRN
  Administered 2018-03-22: 10 mg via INTRAVENOUS

## 2018-03-22 MED ORDER — PROPOFOL 10 MG/ML IV BOLUS
INTRAVENOUS | Status: DC | PRN
  Administered 2018-03-22: 160 mg via INTRAVENOUS

## 2018-03-22 MED ORDER — HYDROCODONE-ACETAMINOPHEN 7.5-325 MG/15ML PO SOLN
10.0000 mL | Freq: Four times a day (QID) | ORAL | Status: DC | PRN
  Administered 2018-03-22: 10 mL via ORAL

## 2018-03-22 MED ORDER — HYDROCODONE-ACETAMINOPHEN 7.5-325 MG/15ML PO SOLN
ORAL | Status: AC
  Administered 2018-03-22: 10 mL via ORAL
  Filled 2018-03-22: qty 15

## 2018-03-22 MED ORDER — 0.9 % SODIUM CHLORIDE (POUR BTL) OPTIME
TOPICAL | Status: DC | PRN
  Administered 2018-03-22: 1000 mL

## 2018-03-22 MED ORDER — SUCCINYLCHOLINE CHLORIDE 20 MG/ML IJ SOLN
INTRAMUSCULAR | Status: DC | PRN
  Administered 2018-03-22: 100 mg via INTRAVENOUS

## 2018-03-22 MED ORDER — ONDANSETRON HCL 4 MG/2ML IJ SOLN
INTRAMUSCULAR | Status: DC | PRN
  Administered 2018-03-22: 4 mg via INTRAVENOUS

## 2018-03-22 SURGICAL SUPPLY — 35 items
BLADE SURG 15 STRL LF DISP TIS (BLADE) IMPLANT
BLADE SURG 15 STRL SS (BLADE)
CANISTER SUCT 3000ML PPV (MISCELLANEOUS) ×2 IMPLANT
CATH ROBINSON RED A/P 10FR (CATHETERS) ×1 IMPLANT
CLEANER TIP ELECTROSURG 2X2 (MISCELLANEOUS) ×2 IMPLANT
COAGULATOR SUCT SWTCH 10FR 6 (ELECTROSURGICAL) ×2 IMPLANT
CRADLE DONUT ADULT HEAD (MISCELLANEOUS) IMPLANT
DRAPE HALF SHEET 40X57 (DRAPES) IMPLANT
ELECT COATED BLADE 2.86 ST (ELECTRODE) ×2 IMPLANT
ELECT REM PT RETURN 9FT ADLT (ELECTROSURGICAL) ×2
ELECT REM PT RETURN 9FT PED (ELECTROSURGICAL)
ELECTRODE REM PT RETRN 9FT PED (ELECTROSURGICAL) IMPLANT
ELECTRODE REM PT RTRN 9FT ADLT (ELECTROSURGICAL) IMPLANT
GAUZE SPONGE 4X4 16PLY XRAY LF (GAUZE/BANDAGES/DRESSINGS) ×2 IMPLANT
GLOVE BIO SURGEON STRL SZ7.5 (GLOVE) ×2 IMPLANT
GLOVE BIOGEL M STRL SZ7.5 (GLOVE) ×1 IMPLANT
GLOVE BIOGEL PI IND STRL 7.5 (GLOVE) IMPLANT
GLOVE BIOGEL PI INDICATOR 7.5 (GLOVE) ×2
GOWN STRL REUS W/ TWL LRG LVL3 (GOWN DISPOSABLE) ×2 IMPLANT
GOWN STRL REUS W/TWL LRG LVL3 (GOWN DISPOSABLE) ×4
KIT BASIN OR (CUSTOM PROCEDURE TRAY) ×2 IMPLANT
KIT TURNOVER KIT B (KITS) ×2 IMPLANT
NDL HYPO 25GX1X1/2 BEV (NEEDLE) IMPLANT
NEEDLE HYPO 25GX1X1/2 BEV (NEEDLE) IMPLANT
NS IRRIG 1000ML POUR BTL (IV SOLUTION) ×2 IMPLANT
PACK SURGICAL SETUP 50X90 (CUSTOM PROCEDURE TRAY) ×2 IMPLANT
PAD ARMBOARD 7.5X6 YLW CONV (MISCELLANEOUS) ×4 IMPLANT
PENCIL BUTTON HOLSTER BLD 10FT (ELECTRODE) ×2 IMPLANT
SPECIMEN JAR SMALL (MISCELLANEOUS) ×4 IMPLANT
SPONGE TONSIL 1.25 RF SGL STRG (GAUZE/BANDAGES/DRESSINGS) ×1 IMPLANT
SYR BULB 3OZ (MISCELLANEOUS) ×2 IMPLANT
TUBE CONNECTING 12X1/4 (SUCTIONS) ×2 IMPLANT
TUBE SALEM SUMP 14F W/ARV (TUBING) ×2 IMPLANT
TUBE SALEM SUMP 16 FR W/ARV (TUBING) ×1 IMPLANT
YANKAUER SUCT BULB TIP NO VENT (SUCTIONS) ×2 IMPLANT

## 2018-03-22 NOTE — H&P (Signed)
Lauren Gonzalez is an 32 y.o. female.   Chief Complaint: Throat bleeding HPI: 32 year old female underwent tonsillectomy 6/10 started bleeding yesterday morning.  There was initial control with ice water gargles but bleeding resumed tonight.  She came to the ER.  She has vomited blood as well.  She feels fatigued.  Past Medical History:  Diagnosis Date  . ADD (attention deficit disorder) 12/29/2013  . Allergic rhinitis, cause unspecified   . DISORDER, BIPOLAR NEC   . Insomnia, unspecified   . MIGRAINE HEADACHE     Past Surgical History:  Procedure Laterality Date  . CHOLECYSTECTOMY    . FRENULECTOMY, LINGUAL     of the base of the tongue  . s/p left hip labral tear surgury  dec. 2010  . TONSILLECTOMY    . TYMPANOSTOMY TUBE PLACEMENT     as a child  . WISDOM TOOTH EXTRACTION      Family History  Problem Relation Age of Onset  . Colon polyps Father   . Depression Other   . Diabetes Other   . Stroke Other   . Heart disease Other   . Cancer Other        lung and ovarian cancer  . Arthritis Other    Social History:  reports that she has quit smoking. She has never used smokeless tobacco. She reports that she drinks alcohol. Her drug history is not on file.  Allergies:  Allergies  Allergen Reactions  . Influenza Vaccines Rash  . Levaquin [Levofloxacin] Hives and Rash     (Not in a hospital admission)  No results found for this or any previous visit (from the past 48 hour(s)). No results found.  Review of Systems  Constitutional: Positive for malaise/fatigue.  All other systems reviewed and are negative.   Blood pressure (!) 140/107, pulse (!) 153, resp. rate 20, last menstrual period 03/12/2018, SpO2 100 %. Physical Exam  Constitutional: She is oriented to person, place, and time. She appears well-developed and well-nourished. No distress.  HENT:  Head: Normocephalic and atraumatic.  Right Ear: External ear normal.  Left Ear: External ear normal.  Nose: Nose  normal.  Clot adherent to right tonsil fossa.  Eyes: Pupils are equal, round, and reactive to light. Conjunctivae and EOM are normal.  Neck: Normal range of motion. Neck supple.  Cardiovascular: Normal rate.  Respiratory: Effort normal.  Musculoskeletal: Normal range of motion.  Neurological: She is alert and oriented to person, place, and time. No cranial nerve deficit.  Skin: Skin is warm and dry.  Psychiatric: She has a normal mood and affect. Her behavior is normal. Judgment and thought content normal.     Assessment/Plan Post-tonsillectomy hemorrhage  To OR for cautery of tonsil fossa.  Christia ReadingBATES, , MD 03/22/2018, 1:58 AM

## 2018-03-22 NOTE — Anesthesia Preprocedure Evaluation (Signed)
Anesthesia Evaluation  Patient identified by MRN, date of birth, ID band Patient awake    Reviewed: Allergy & Precautions, NPO status , Patient's Chart, lab work & pertinent test resultsPreop documentation limited or incomplete due to emergent nature of procedure.  Airway        Dental   Pulmonary former smoker,           Cardiovascular negative cardio ROS       Neuro/Psych  Headaches,    GI/Hepatic negative GI ROS, Neg liver ROS,   Endo/Other    Renal/GU negative Renal ROS     Musculoskeletal   Abdominal   Peds  Hematology   Anesthesia Other Findings   Reproductive/Obstetrics                             Anesthesia Physical Anesthesia Plan  ASA: II  Anesthesia Plan: General   Post-op Pain Management:    Induction: Intravenous, Rapid sequence and Cricoid pressure planned  PONV Risk Score and Plan: Ondansetron, Dexamethasone and Midazolam  Airway Management Planned: Oral ETT  Additional Equipment:   Intra-op Plan:   Post-operative Plan: Possible Post-op intubation/ventilation  Informed Consent: I have reviewed the patients History and Physical, chart, labs and discussed the procedure including the risks, benefits and alternatives for the proposed anesthesia with the patient or authorized representative who has indicated his/her understanding and acceptance.   Dental advisory given  Plan Discussed with: CRNA and Anesthesiologist  Anesthesia Plan Comments:         Anesthesia Quick Evaluation

## 2018-03-22 NOTE — Anesthesia Postprocedure Evaluation (Signed)
Anesthesia Post Note  Patient: Lauren AquasHeather L Schmelter  Procedure(s) Performed: CAUTERY OF POST TONSILLAR BLEEDING (N/A Mouth)     Patient location during evaluation: PACU Anesthesia Type: General Level of consciousness: awake Pain management: pain level controlled Vital Signs Assessment: post-procedure vital signs reviewed and stable Respiratory status: spontaneous breathing Cardiovascular status: stable Anesthetic complications: no    Last Vitals:  Vitals:   03/22/18 0330 03/22/18 0345  BP: 117/71 112/66  Pulse: 99 81  Resp: 14 15  Temp: 36.6 C   SpO2: 100% 100%    Last Pain:  Vitals:   03/22/18 0345  PainSc: 2                   

## 2018-03-22 NOTE — Op Note (Signed)
NAME: Lauren Gonzalez, Amiri L. MEDICAL RECORD [id number] ACCOUNT [id number] DATE OF BIRTH:[birth year] FACILITY: [facility] LOCATION: [facility] PHYSICIAN: Jenne Pane. , MD  OPERATIVE REPORT  DATE OF PROCEDURE:  03/22/2018  PREOPERATIVE DIAGNOSIS:  Post-tonsillectomy hemorrhage.  POSTOPERATIVE DIAGNOSIS:  Post-tonsillectomy hemorrhage.  PROCEDURE:  Cautery of right post-tonsillectomy hemorrhage.  SURGEON:  Christia Readingwight , MD  ANESTHESIA:  General endotracheal anesthesia.  COMPLICATIONS:  None.  INDICATIONS:  The patient is a 32 year old female who underwent tonsillectomy 5 days ago and developed bleeding earlier yesterday that initially responded to ice water gargles but resumed later that night.  She came to the emergency department and was  brought to the operating room for cautery.  FINDINGS:  There was clot adherent to the right tonsillar fossa.  Upon removing the clot, a bleeding site was easily identified in the right lower fossa.  DESCRIPTION OF PROCEDURE:  The patient was identified in the holding room and informed consent, having been obtained from discussion of risks, benefits, alternatives, the patient was brought to the operative suite and placed on the operating table in  supine position.  Anesthesia was induced, and the patient was intubated by the anesthesia team without difficulty.  The bed was turned 90 degrees from anesthesia, and a head wrap was placed around the patient's head.  A Crowe-Davis retractor was inserted  in the mouth and opened through the oropharynx.  It was placed in suspension on the Mayo stand.  The right tonsillar fossa was then suctioned and clot removed.  The bleeding site was identified.  It was then cauterized using suction cautery on a setting  of 30.  After this was completed, the throat was copiously irrigated with saline.  A nasogastric tube was placed down the esophagus to suction out the stomach and esophagus.  The stomach was lavaged with saline, and  some digested-looking blood was  suctioned, but not a great volume.  After this was completed, the nasogastric tube was removed, and the retractor was taken out of suspension and removed from the patient's mouth.  She was turned back to anesthesia for wakeup, was extubated, and moved to  the recovery room in stable condition.  LN/NUANCE  D:03/22/2018 T:03/22/2018 JOB:[id number]

## 2018-03-22 NOTE — Anesthesia Procedure Notes (Signed)
Procedure Name: Intubation Date/Time: 03/22/2018 2:27 AM Performed by: Babs Bertin, CRNA Pre-anesthesia Checklist: Patient identified, Emergency Drugs available, Suction available and Patient being monitored Patient Re-evaluated:Patient Re-evaluated prior to induction Oxygen Delivery Method: Circle System Utilized Preoxygenation: Pre-oxygenation with 100% oxygen Induction Type: IV induction, Rapid sequence and Cricoid Pressure applied Laryngoscope Size: Mac and 3 Grade View: Grade I Tube type: Oral Tube size: 7.0 mm Number of attempts: 1 Airway Equipment and Method: Stylet and Oral airway Placement Confirmation: ETT inserted through vocal cords under direct vision,  positive ETCO2 and breath sounds checked- equal and bilateral Secured at: 21 cm Tube secured with: Tape Dental Injury: Teeth and Oropharynx as per pre-operative assessment

## 2018-03-22 NOTE — Transfer of Care (Signed)
Immediate Anesthesia Transfer of Care Note  Patient: Lauren Gonzalez  Procedure(s) Performed: CAUTERY OF POST TONSILLAR BLEEDING (N/A Mouth)  Patient Location: PACU  Anesthesia Type:General  Level of Consciousness: awake, alert  and oriented  Airway & Oxygen Therapy: Patient Spontanous Breathing  Post-op Assessment: Report given to RN and Post -op Vital signs reviewed and stable  Post vital signs: Reviewed and stable  Last Vitals:  Vitals Value Taken Time  BP 113/73 03/22/2018  2:49 AM  Temp 36.5 C 03/22/2018  2:47 AM  Pulse 110 03/22/2018  2:53 AM  Resp 15 03/22/2018  2:53 AM  SpO2 100 % 03/22/2018  2:53 AM  Vitals shown include unvalidated device data.  Last Pain:  Vitals:   03/22/18 0247  PainSc: 0-No pain         Complications: No apparent anesthesia complications

## 2018-03-22 NOTE — ED Notes (Signed)
Lauren Gonzalez, Consulting civil engineerCharge RN notified of room needed for pt.

## 2018-03-22 NOTE — ED Triage Notes (Addendum)
Pt had tonsillectomy on Monday (Dr. Jenne PaneBates).  Pt profusely bleeding x 1 hour.  Pt has approx 1/2 quart container of blood (pta) and is actively bleeding on arrival.  States she was bleeding earlier tonight and was able to get it stop with cold water but it started back.

## 2018-03-22 NOTE — ED Provider Notes (Signed)
[facility] EMERGENCY DEPARTMENT Provider Note   CSN: [id number] Arrival date & time: 03/22/18  0045     History   Chief Complaint Chief Complaint  Patient presents with  . bleeding s/p tonsillectomy    HPI Lauren Gonzalez is a 31 y.o. female.  The history is provided by the patient.  She has a history of attention deficit disorder and chronic tonsillitis and comes in with bleeding from her tonsils.  She had tonsillectomy done 4 days ago.  This morning, she had coughed and states that the scab came off of the right side and it had bled, but the bleeding stopped.  Tonight, the bleeding got profuse and seem to be coming from both sides.  She has vomited a large amount of blood.  She denies any trauma and denies eating anything of the ordinary.  Past Medical History:  Diagnosis Date  . ADD (attention deficit disorder) 12/29/2013  . Allergic rhinitis, cause unspecified   . DISORDER, BIPOLAR NEC   . Insomnia, unspecified   . MIGRAINE HEADACHE     Patient Active Problem List   Diagnosis Date Noted  . Acute upper respiratory infection 12/02/2017  . Chronic tonsillitis 12/02/2017  . Muscle cramps 12/02/2017  . Cough 12/02/2017  . Mixed hyperlipidemia 07/01/2017  . Nausea & vomiting 01/10/2017  . Hematemesis 01/10/2017  . Diarrhea 01/10/2017  . Viral illness 01/10/2017  . Tachycardia 01/10/2017  . Influenza 10/23/2016  . Concussion without loss of consciousness 09/12/2016  . Neck pain 09/12/2016  . Thoracic back pain 09/12/2016  . Rash and nonspecific skin eruption 06/05/2016  . Flu vaccine need 11/30/2015  . Viral pharyngitis 11/30/2015  . Pre-employment examination 07/22/2014  . Abscess or cellulitis of chin 02/26/2014  . ADD (attention deficit disorder) 12/29/2013  . Lack of concentration 10/30/2013  . Allergic rhinitis 07/28/2013  . Amenorrhea 07/28/2013  . Anxiety 08/26/2012  . Encounter for well adult exam with abnormal findings 05/31/2011  .  HEMORRHOIDS, INTERNAL 05/10/2008  . DISORDER, BIPOLAR NEC 07/05/2007  . MIGRAINE HEADACHE 07/05/2007  . Insomnia 07/05/2007    Past Surgical History:  Procedure Laterality Date  . CHOLECYSTECTOMY    . FRENULECTOMY, LINGUAL     of the base of the tongue  . s/p left hip labral tear surgury  dec. 2010  . TONSILLECTOMY    . TYMPANOSTOMY TUBE PLACEMENT     as a child  . WISDOM TOOTH EXTRACTION       OB History   None      Home Medications    Prior to Admission medications   Medication Sig Start Date End Date Taking? Authorizing Provider  albuterol (PROVENTIL HFA;VENTOLIN HFA) 108 (90 Base) MCG/ACT inhaler Inhale 2 puffs into the lungs every 6 (six) hours as needed for wheezing or shortness of breath. 08/21/16   Nche, Bonna Gains, NP  cetirizine (ZYRTEC) 10 MG tablet Take 1 tablet (10 mg total) by mouth daily. 02/26/14   Corwin Levins, MD  clonazePAM (KLONOPIN) 1 MG tablet Take 1 tablet (1 mg total) by mouth 2 (two) times daily as needed for anxiety. 07/01/17   Nche, Bonna Gains, NP  cyclobenzaprine (FLEXERIL) 5 MG tablet Take 1 tablet (5 mg total) by mouth 3 (three) times daily as needed for muscle spasms. 12/02/17   Corwin Levins, MD  etonogestrel-ethinyl estradiol (NUVARING) 0.12-0.015 MG/24HR vaginal ring Place 1 each vaginally every 28 (twenty-eight) days. Insert vaginally and leave in place for 3 consecutive weeks, then remove  for 1 week. 01/10/17   Corwin LevinsJohn, James W, MD  ibuprofen (ADVIL,MOTRIN) 800 MG tablet Take 1 tablet (800 mg total) by mouth every 6 (six) hours as needed. 07/01/17   Nche, Bonna Gainsharlotte Lum, NP  methocarbamol (ROBAXIN) 500 MG tablet Take 1 tablet (500 mg total) by mouth 3 (three) times daily as needed for muscle spasms. 07/01/17   Nche, Bonna Gainsharlotte Lum, NP  triamcinolone cream (KENALOG) 0.1 % Apply topically 2 (two) times daily. 06/05/16   Corwin LevinsJohn, James W, MD  zolmitriptan (ZOMIG) 5 MG nasal solution Place 1 spray into the nose as needed. May repeat once after 2 hours  02/26/14   Corwin LevinsJohn, James W, MD  zolpidem (AMBIEN CR) 12.5 MG CR tablet Take 1 tablet (12.5 mg total) by mouth at bedtime as needed for sleep. 12/02/17   Corwin LevinsJohn, James W, MD    Family History Family History  Problem Relation Age of Onset  . Colon polyps Father   . Depression Other   . Diabetes Other   . Stroke Other   . Heart disease Other   . Cancer Other        lung and ovarian cancer  . Arthritis Other     Social History Social History   Tobacco Use  . Smoking status: Former Games developermoker  . Smokeless tobacco: Never Used  Substance Use Topics  . Alcohol use: Yes    Comment: occasion  . Drug use: Not on file     Allergies   Influenza vaccines   Review of Systems Review of Systems  All other systems reviewed and are negative.    Physical Exam Updated Vital Signs BP (!) 140/107 (BP Location: Right Arm)   Pulse (!) 153   Resp 20   LMP 03/12/2018   SpO2 100%   Physical Exam  Nursing note and vitals reviewed.  32 year old female, resting comfortably and in no acute distress. Vital signs are significant for elevated blood pressure and markedly elevated heart rate. Oxygen saturation is 100%, which is normal. Head is normocephalic and atraumatic. PERRLA, EOMI. Oropharynx is shows a small amount of bright red blood present without any active bleeding seen. Neck is nontender and supple without adenopathy or JVD. Back is nontender and there is no CVA tenderness. Lungs are clear without rales, wheezes, or rhonchi. Chest is nontender. Heart has regular rate and rhythm without murmur. Abdomen is soft, flat, nontender without masses or hepatosplenomegaly and peristalsis is hypoactive. Extremities have no cyanosis or edema, full range of motion is present. Skin is mildly diaphoretic without rash. Neurologic: Mental status is normal, cranial nerves are intact, there are no motor or sensory deficits.  ED Treatments / Results  Labs (all labs ordered are listed, but only abnormal  results are displayed) Labs Reviewed  BASIC METABOLIC PANEL - Abnormal; Notable for the following components:      Result Value   Potassium 3.1 (*)    CO2 21 (*)    Glucose, Bld 160 (*)    Calcium 8.6 (*)    All other components within normal limits  CBC WITH DIFFERENTIAL/PLATELET - Abnormal; Notable for the following components:   WBC 12.8 (*)    RBC 3.73 (*)    Hemoglobin 11.4 (*)    HCT 34.8 (*)    Lymphs Abs 4.8 (*)    All other components within normal limits  TYPE AND SCREEN  ABO/RH    Procedures Procedures   Medications Ordered in ED Medications - No data to display  Initial Impression / Assessment and Plan / ED Course  I have reviewed the triage vital signs and the nursing notes.  Pertinent lab results that were available during my care of the patient were reviewed by me and considered in my medical decision making (see chart for details).  Post tonsillectomy bleeding.  No active bleeding at the moment.  It does sound as if she has had significant blood loss at home.  She will be given IV fluids, blood drawn for CBC and type and screen.  Case is discussed with Dr. Jenne Pane, who was her ENT surgeon, who is coming in to take her to the operating room to cauterize the bleeding site.  Final Clinical Impressions(s) / ED Diagnoses   Final diagnoses:  Tonsillar bleed  Postoperative anemia due to acute blood loss  Hypokalemia    ED Discharge Orders    None       Dione Booze, MD 03/22/18 [phone]

## 2018-03-22 NOTE — ED Notes (Addendum)
Pt had 2 emesis bags 1/3 full of blood.  Dr. Preston FleetingGlick aware of pt.  Pt c/o dizziness.

## 2018-03-22 NOTE — Progress Notes (Signed)
Pt dc home with mother. IV dced. Tip intact.site wnl No nausea or bleeding noted. Pain relief after oral med

## 2018-03-22 NOTE — ED Notes (Addendum)
Charge RN notified of room needed for pt emergently

## 2018-03-22 NOTE — Brief Op Note (Signed)
03/22/2018  2:42 AM  PATIENT:  Tharon AquasHeather L Waterbury  32 y.o. female  PRE-OPERATIVE DIAGNOSIS:  POST TONSILLAR BLEED  POST-OPERATIVE DIAGNOSIS:  POST TONSILLAR BLEED  PROCEDURE:  Procedure(s): CAUTERY OF POST TONSILLAR BLEEDING (N/A)  SURGEON:  Surgeon(s) and Role:    Christia Reading* , , MD - Primary  PHYSICIAN ASSISTANT:   ASSISTANTS: none   ANESTHESIA:   general  EBL:  Minimal   BLOOD ADMINISTERED:none  DRAINS: none   LOCAL MEDICATIONS USED:  NONE  SPECIMEN:  No Specimen  DISPOSITION OF SPECIMEN:  N/A  COUNTS:  YES  TOURNIQUET:  * No tourniquets in log *  DICTATION: .Other Dictation: Dictation Number [id number]  PLAN OF CARE: Discharge to home after PACU  PATIENT DISPOSITION:  PACU - hemodynamically stable.   Delay start of Pharmacological VTE agent (>24hrs) due to surgical blood loss or risk of bleeding: yes

## 2018-03-23 ENCOUNTER — Encounter (HOSPITAL_COMMUNITY): Payer: Self-pay | Admitting: Otolaryngology

## 2018-04-03 DIAGNOSIS — Z1389 Encounter for screening for other disorder: Secondary | ICD-10-CM | POA: Diagnosis not present

## 2018-04-03 DIAGNOSIS — Z01419 Encounter for gynecological examination (general) (routine) without abnormal findings: Secondary | ICD-10-CM | POA: Diagnosis not present

## 2018-04-03 DIAGNOSIS — Z130 Encounter for screening for diseases of the blood and blood-forming organs and certain disorders involving the immune mechanism: Secondary | ICD-10-CM | POA: Diagnosis not present

## 2018-04-03 DIAGNOSIS — Z6828 Body mass index (BMI) 28.0-28.9, adult: Secondary | ICD-10-CM | POA: Diagnosis not present

## 2018-04-03 DIAGNOSIS — Z3044 Encounter for surveillance of vaginal ring hormonal contraceptive device: Secondary | ICD-10-CM | POA: Diagnosis not present

## 2018-04-07 DIAGNOSIS — M79661 Pain in right lower leg: Secondary | ICD-10-CM | POA: Insufficient documentation

## 2018-04-08 ENCOUNTER — Ambulatory Visit (HOSPITAL_COMMUNITY)
Admission: RE | Admit: 2018-04-08 | Discharge: 2018-04-08 | Disposition: A | Discharge status: Home / Self Care | Payer: BLUE CROSS/BLUE SHIELD | Source: Ambulatory Visit | Attending: Otolaryngology | Admitting: Otolaryngology

## 2018-04-08 ENCOUNTER — Other Ambulatory Visit (HOSPITAL_COMMUNITY): Payer: Self-pay | Admitting: Otolaryngology

## 2018-04-08 ENCOUNTER — Telehealth: Payer: Self-pay | Admitting: Internal Medicine

## 2018-04-08 DIAGNOSIS — R52 Pain, unspecified: Secondary | ICD-10-CM

## 2018-04-08 DIAGNOSIS — I824Z1 Acute embolism and thrombosis of unspecified deep veins of right distal lower extremity: Secondary | ICD-10-CM | POA: Diagnosis not present

## 2018-04-08 DIAGNOSIS — M79661 Pain in right lower leg: Secondary | ICD-10-CM | POA: Diagnosis present

## 2018-04-08 MED ORDER — RIVAROXABAN (XARELTO) VTE STARTER PACK (15 & 20 MG): ORAL_TABLET | ORAL | 0 refills | Status: DC

## 2018-04-08 NOTE — Telephone Encounter (Signed)
shirron to call pt  OK to start Xarelto "starter pak" - done erx for acute RLE DVT -   Ok to f/u here at 3 wks please

## 2018-04-08 NOTE — Progress Notes (Signed)
*  Preliminary Results* Right lower extremity venous duplex completed. Right lower extremity is positive for acute deep vein thrombosis involving the right peroneal veins. There is no evidence of right Baker's cyst.  Preliminary results discussed with Dr. Jenne PaneBates.  04/08/2018 3:20 PM  Gertie FeyMichelle , BS, RVT, RDCS, RDMS

## 2018-04-08 NOTE — Telephone Encounter (Signed)
Pt has been informed. Appt scheduled for 7/25 at 11am

## 2018-04-14 ENCOUNTER — Telehealth: Payer: Self-pay | Admitting: Internal Medicine

## 2018-04-14 MED ORDER — ACETAMINOPHEN-CODEINE #3 300-30 MG PO TABS: 1.0000 | ORAL_TABLET | Freq: Four times a day (QID) | ORAL | 0 refills | Status: DC | PRN

## 2018-04-14 NOTE — Telephone Encounter (Signed)
Work note done as well

## 2018-04-14 NOTE — Telephone Encounter (Signed)
Copied from CRM [phone]. Topic: Quick Communication - See Telephone Encounter >> Apr 14, 2018  7:32 AM Oneal GroutSebastian, Jennifer S wrote: CRM for notification. See Telephone encounter for: 04/14/18. On blood thinners. Patient has gone back to work, on her feet for 12 hrs.  Having pain in feet. Would like to know how long she will experience this pain? Concerned about work

## 2018-04-14 NOTE — Telephone Encounter (Signed)
Pt.notified

## 2018-04-14 NOTE — Addendum Note (Signed)
Addended by: Corwin LevinsJOHN, JAMES W on: 04/14/2018 12:45 PM   Modules accepted: Orders

## 2018-04-14 NOTE — Telephone Encounter (Signed)
She would like to proceed with the work note and have [facility] [address]

## 2018-04-14 NOTE — Telephone Encounter (Signed)
Done erx 

## 2018-04-14 NOTE — Telephone Encounter (Signed)
The pain often worse for the first week, then some steady improvement after that  She should avoid NSAIDS due to the blood thinners,  I can offer a short course of Tylenol #3 (tylenol with codeine) if she likes  Also should not go to work this week, we can do work note if needed

## 2018-05-01 ENCOUNTER — Ambulatory Visit: Payer: BLUE CROSS/BLUE SHIELD | Admitting: Internal Medicine

## 2018-05-01 ENCOUNTER — Encounter: Payer: Self-pay | Admitting: Internal Medicine

## 2018-05-01 ENCOUNTER — Ambulatory Visit (INDEPENDENT_AMBULATORY_CARE_PROVIDER_SITE_OTHER)
Admission: RE | Admit: 2018-05-01 | Discharge: 2018-05-01 | Disposition: A | Discharge status: Home / Self Care | Payer: BLUE CROSS/BLUE SHIELD | Source: Ambulatory Visit | Attending: Internal Medicine | Admitting: Internal Medicine

## 2018-05-01 VITALS — BP 114/78 | HR 86 | Temp 98.2°F | Ht 65.0 in | Wt 181.0 lb

## 2018-05-01 DIAGNOSIS — R0609 Other forms of dyspnea: Secondary | ICD-10-CM

## 2018-05-01 DIAGNOSIS — R0600 Dyspnea, unspecified: Secondary | ICD-10-CM

## 2018-05-01 DIAGNOSIS — I82451 Acute embolism and thrombosis of right peroneal vein: Secondary | ICD-10-CM

## 2018-05-01 DIAGNOSIS — F419 Anxiety disorder, unspecified: Secondary | ICD-10-CM

## 2018-05-01 DIAGNOSIS — I82491 Acute embolism and thrombosis of other specified deep vein of right lower extremity: Secondary | ICD-10-CM

## 2018-05-01 DIAGNOSIS — Z86718 Personal history of other venous thrombosis and embolism: Secondary | ICD-10-CM | POA: Insufficient documentation

## 2018-05-01 MED ORDER — IOPAMIDOL (ISOVUE-370) INJECTION 76%
100.0000 mL | Freq: Once | INTRAVENOUS | Status: AC | PRN
  Administered 2018-05-01: 80 mL via INTRAVENOUS

## 2018-05-01 MED ORDER — RIVAROXABAN 20 MG PO TABS: 20.0000 mg | ORAL_TABLET | Freq: Every day | ORAL | 4 refills | Status: DC

## 2018-05-01 NOTE — Progress Notes (Signed)
Subjective:    Patient ID: Lauren Gonzalez, female    DOB: Sep 19, 1986, 32 y.o.   MRN: [id number]  HPI  Here to f/u recent acute RLE DVT with mother;  Pt initially underwent June 10 tonsillectomy per ENT, unfortunately complicated by post op bleeding requiring f/u procedure.  She notes a complaint of unusual recurring right side Chest pain dull nonpositional, nonpleuritic and non exertional without fever, cough but with mild sob/doe starting about June 15. No other assoc symptoms such as n/v, palpitation or diaphoresis  At some point she is not sure, she developed acute RLE DVT of the peroneal veins determined by whole leg ultrasonagraphy.  I did not personally see the pt at that time, but did d/w ENT her condition and pt was reported VSS and no CV symptoms and o/w stable.  I advised xarelto starter pack which was started 7/3.  No further overt bleeding.  Right leg pain was mild to mod to start, now overall improved.   Good med compliance.  Family and pt wanting to know next steps and if any further imaging now or later need to occur.   Pt denies fever, wt loss, night sweats, loss of appetite, or other constitutional symptoms  Pt denies wheezing, orthopnea, PND, palpitations, dizziness or syncope, and right leg mild swelling overall improved.  Pt denies new neurological symptoms such as new headache, or facial or extremity weakness or numbness   Pt denies polydipsia, polyuria.  Denies worsening depressive symptoms, suicidal ideation, or panic Past Medical History:  Diagnosis Date  . ADD (attention deficit disorder) 12/29/2013  . Allergic rhinitis, cause unspecified   . DISORDER, BIPOLAR NEC   . Insomnia, unspecified   . MIGRAINE HEADACHE    Past Surgical History:  Procedure Laterality Date  . CHOLECYSTECTOMY    . FRENULECTOMY, LINGUAL     of the base of the tongue  . s/p left hip labral tear surgury  dec. 2010  . TONSILLECTOMY    . TONSILLECTOMY N/A 03/22/2018   Procedure: CAUTERY OF POST  TONSILLAR BLEEDING;  Surgeon: Christia Reading, MD;  Location: [facility] OR;  Service: ENT;  Laterality: N/A;  . TYMPANOSTOMY TUBE PLACEMENT     as a child  . WISDOM TOOTH EXTRACTION      reports that she has quit smoking. She has never used smokeless tobacco. She reports that she drinks alcohol. Her drug history is not on file. family history includes Arthritis in her other; Cancer in her other; Colon polyps in her father; Depression in her other; Diabetes in her other; Heart disease in her other; Stroke in her other. Allergies  Allergen Reactions  . Influenza Vaccines Rash  . Levaquin [Levofloxacin] Hives and Rash   Current Outpatient Medications on File Prior to Visit  Medication Sig Dispense Refill  . acetaminophen-codeine (TYLENOL #3) 300-30 MG tablet Take 1 tablet by mouth every 6 (six) hours as needed for moderate pain. 30 tablet 0  . albuterol (PROVENTIL HFA;VENTOLIN HFA) 108 (90 Base) MCG/ACT inhaler Inhale 2 puffs into the lungs every 6 (six) hours as needed for wheezing or shortness of breath. 1 Inhaler 0  . clonazePAM (KLONOPIN) 1 MG tablet Take 1 tablet (1 mg total) by mouth 2 (two) times daily as needed for anxiety. 60 tablet 0  . cyclobenzaprine (FLEXERIL) 5 MG tablet Take 1 tablet (5 mg total) by mouth 3 (three) times daily as needed for muscle spasms. 40 tablet 1  . etonogestrel-ethinyl estradiol (NUVARING) 0.12-0.015 MG/24HR vaginal ring Place  1 each vaginally every 28 (twenty-eight) days. Insert vaginally and leave in place for 3 consecutive weeks, then remove for 1 week. 1 each 0  . HYDROcodone-acetaminophen (HYCET) 7.5-325 mg/15 ml solution Take 15-20 mLs by mouth every 4 (four) hours as needed for moderate pain.   0  . HYDROcodone-acetaminophen (HYCET) 7.5-325 mg/15 ml solution Take 15 mLs by mouth every 6 (six) hours as needed for moderate pain. 250 mL 0  . ibuprofen (ADVIL,MOTRIN) 100 MG/5ML suspension Take 300 mg by mouth every 4 (four) hours as needed for fever or mild pain.      Marland Kitchen. ibuprofen (ADVIL,MOTRIN) 800 MG tablet Take 1 tablet (800 mg total) by mouth every 6 (six) hours as needed. 60 tablet 0  . promethazine (PHENERGAN) 12.5 MG suppository Place 1 suppository (12.5 mg total) rectally every 6 (six) hours as needed for nausea or vomiting. 12 each 0  . promethazine (PHENERGAN) 25 MG tablet Take 25 mg by mouth every 4 (four) hours as needed for nausea or vomiting.   0  . zolpidem (AMBIEN CR) 12.5 MG CR tablet Take 1 tablet (12.5 mg total) by mouth at bedtime as needed for sleep. 90 tablet 1   No current facility-administered medications on file prior to visit.    Review of Systems  Constitutional: Negative for other unusual diaphoresis or sweats HENT: Negative for ear discharge or swelling Eyes: Negative for other worsening visual disturbances Respiratory: Negative for stridor or other swelling  Gastrointestinal: Negative for worsening distension or other blood Genitourinary: Negative for retention or other urinary change Musculoskeletal: Negative for other MSK pain or swelling Skin: Negative for color change or other new lesions Neurological: Negative for worsening tremors and other numbness  Psychiatric/Behavioral: Negative for worsening agitation or other fatigue All other system neg per pt    Objective:   Physical Exam BP 114/78   Pulse 86   Temp 98.2 F (36.8 C) (Oral)   Ht 5\' 5"  (1.651 m)   Wt 181 lb (82.1 kg)   SpO2 98%   BMI 30.12 kg/m  VS noted,  Constitutional: Pt appears in NAD HENT: Head: NCAT.  Right Ear: External ear normal.  Left Ear: External ear normal.  Eyes: . Pupils are equal, round, and reactive to light. Conjunctivae and EOM are normal Nose: without d/c or deformity Neck: Neck supple. Gross normal ROM Cardiovascular: Normal rate and regular rhythm.   Pulmonary/Chest: Effort normal and breath sounds without rales or wheezing.  Neurological: Pt is alert. At baseline orientation, motor grossly intact Skin: Skin is warm. No  rashes, other new lesions, no LE edema Psychiatric: Pt behavior is normal without agitation , 1+ nervous Right leg with mild prox post leg tender without cords, redness and slight swelling only    Assessment & Plan:

## 2018-05-01 NOTE — Patient Instructions (Signed)
Ok to finish the [employer] Financialxarelto starter pack that you are taking  Then start xarelto 20 mg per day for another 5 months  You will be contacted regarding the referral for: CT scan for the chest [facility]  Please continue all other medications as before, and refills have been done if requested.  Please have the pharmacy call with any other refills you may need.  Please keep your appointments with your specialists as you may have planned  Please return in 6 months, or sooner if needed

## 2018-05-02 NOTE — Assessment & Plan Note (Signed)
Exam benign, etiology unclear, for CTA chest today, cont same tx

## 2018-05-02 NOTE — Assessment & Plan Note (Signed)
Stable overall, cont same tx 

## 2018-05-02 NOTE — Assessment & Plan Note (Addendum)
Improved and overall stable, to cont xarelto for planned total 6 mo at 20 qd, f/u at that time and consider heme referral for r/o hypercoagulability; and we d/w pt and family that f/u venous doppler u/s is not routinely needed to prove resolution  Note:  Total time for pt hx, exam, review of record with pt in the room, determination of diagnoses and plan for further eval and tx is > 40 min, with over 50% spent in coordination and counseling of patient including the differential dx, tx, further evaluation and other management of acute DVT, DOE, and anxiety

## 2018-07-07 ENCOUNTER — Telehealth: Payer: Self-pay | Admitting: Internal Medicine

## 2018-07-07 NOTE — Telephone Encounter (Signed)
Ok. Thanks!

## 2018-07-07 NOTE — Telephone Encounter (Signed)
Please advise if this needs to be OV or ED?

## 2018-07-07 NOTE — Telephone Encounter (Signed)
Left patient vm to call back in regard to bite.  Further questions are needed to determine where patient needs to be treated (ex. Office or ED)  Please route patient to office.

## 2018-07-07 NOTE — Telephone Encounter (Signed)
Noted  

## 2018-07-07 NOTE — Telephone Encounter (Signed)
Patient has scheduled appointment for tomorrow with Dr. Jonny Ruiz.  Patient states she was playing around with her dog when this happened and that he is up to date on all his vacs.

## 2018-07-07 NOTE — Telephone Encounter (Signed)
Copied from CRM [id number]. Topic: General - Other >> Jul 07, 2018  9:35 AM Ronney Lion A wrote: Reason for CRM: Patient called in wanting to have amoxicillin called in to the pharmacy, she says she got an animal bite on her upper lip, says she already treated the bite but would  now like antibiotics sent in for it.   Patient says it's not life threatening

## 2018-07-08 ENCOUNTER — Encounter: Payer: Self-pay | Admitting: Internal Medicine

## 2018-07-08 ENCOUNTER — Ambulatory Visit: Payer: BLUE CROSS/BLUE SHIELD | Admitting: Internal Medicine

## 2018-07-08 VITALS — BP 122/80 | HR 88 | Temp 98.3°F | Ht 65.0 in | Wt 182.0 lb

## 2018-07-08 DIAGNOSIS — S01551A Open bite of lip, initial encounter: Secondary | ICD-10-CM | POA: Diagnosis not present

## 2018-07-08 DIAGNOSIS — R739 Hyperglycemia, unspecified: Secondary | ICD-10-CM | POA: Diagnosis not present

## 2018-07-08 DIAGNOSIS — W540XXA Bitten by dog, initial encounter: Secondary | ICD-10-CM

## 2018-07-08 DIAGNOSIS — I82451 Acute embolism and thrombosis of right peroneal vein: Secondary | ICD-10-CM

## 2018-07-08 MED ORDER — AMOXICILLIN-POT CLAVULANATE 875-125 MG PO TABS: 1.0000 | ORAL_TABLET | Freq: Two times a day (BID) | ORAL | 0 refills | Status: DC

## 2018-07-08 NOTE — Progress Notes (Signed)
Subjective:    Patient ID: Lauren Gonzalez, female    DOB: Aug 18, 1986, 32 y.o.   MRN: [id number]  HPI  Here after unfortunately 2 days ago suffered dog related nip with puncture/small laceration to left mid upper lip,pt self tx with superglue herself after peroxide and chlorhexadine and did not go to ED. Had a fair amount of bleeding to start on the xarelto but able to stop th bleeding with direct pressure, did held xarelto yesterday, to restart today.  No further bleeding. Also with marked swelling and mild discomfort some improved today after used ice quite a bit yesterday.  Dog with all shots including rabies.  No other worsening redness or drainage or fever but checking to see if antibx needed Past Medical History:  Diagnosis Date  . ADD (attention deficit disorder) 12/29/2013  . Allergic rhinitis, cause unspecified   . DISORDER, BIPOLAR NEC   . Insomnia, unspecified   . MIGRAINE HEADACHE    Past Surgical History:  Procedure Laterality Date  . CHOLECYSTECTOMY    . FRENULECTOMY, LINGUAL     of the base of the tongue  . s/p left hip labral tear surgury  dec. 2010  . TONSILLECTOMY    . TONSILLECTOMY N/A 03/22/2018   Procedure: CAUTERY OF POST TONSILLAR BLEEDING;  Surgeon: Christia Reading, MD;  Location: [facility] OR;  Service: ENT;  Laterality: N/A;  . TYMPANOSTOMY TUBE PLACEMENT     as a child  . WISDOM TOOTH EXTRACTION      reports that she has quit smoking. She has never used smokeless tobacco. She reports that she drinks alcohol. Her drug history is not on file. family history includes Arthritis in her other; Cancer in her other; Colon polyps in her father; Depression in her other; Diabetes in her other; Heart disease in her other; Stroke in her other. Allergies  Allergen Reactions  . Influenza Vaccines Rash  . Levaquin [Levofloxacin] Hives and Rash   Current Outpatient Medications on File Prior to Visit  Medication Sig Dispense Refill  . acetaminophen-codeine (TYLENOL #3) 300-30 MG  tablet Take 1 tablet by mouth every 6 (six) hours as needed for moderate pain. 30 tablet 0  . albuterol (PROVENTIL HFA;VENTOLIN HFA) 108 (90 Base) MCG/ACT inhaler Inhale 2 puffs into the lungs every 6 (six) hours as needed for wheezing or shortness of breath. 1 Inhaler 0  . clonazePAM (KLONOPIN) 1 MG tablet Take 1 tablet (1 mg total) by mouth 2 (two) times daily as needed for anxiety. 60 tablet 0  . cyclobenzaprine (FLEXERIL) 5 MG tablet Take 1 tablet (5 mg total) by mouth 3 (three) times daily as needed for muscle spasms. 40 tablet 1  . etonogestrel-ethinyl estradiol (NUVARING) 0.12-0.015 MG/24HR vaginal ring Place 1 each vaginally every 28 (twenty-eight) days. Insert vaginally and leave in place for 3 consecutive weeks, then remove for 1 week. 1 each 0  . HYDROcodone-acetaminophen (HYCET) 7.5-325 mg/15 ml solution Take 15 mLs by mouth every 6 (six) hours as needed for moderate pain. 250 mL 0  . ibuprofen (ADVIL,MOTRIN) 100 MG/5ML suspension Take 300 mg by mouth every 4 (four) hours as needed for fever or mild pain.    Marland Kitchen ibuprofen (ADVIL,MOTRIN) 800 MG tablet Take 1 tablet (800 mg total) by mouth every 6 (six) hours as needed. 60 tablet 0  . rivaroxaban (XARELTO) 20 MG TABS tablet Take 1 tablet (20 mg total) by mouth daily with supper. 30 tablet 4  . zolpidem (AMBIEN CR) 12.5 MG CR tablet Take  1 tablet (12.5 mg total) by mouth at bedtime as needed for sleep. 90 tablet 1   No current facility-administered medications on file prior to visit.    Review of Systems  Constitutional: Negative for other unusual diaphoresis or sweats HENT: Negative for ear discharge or swelling Eyes: Negative for other worsening visual disturbances Respiratory: Negative for stridor or other swelling  Gastrointestinal: Negative for worsening distension or other blood Genitourinary: Negative for retention or other urinary change Musculoskeletal: Negative for other MSK pain or swelling Skin: Negative for color change or  other new lesions Neurological: Negative for worsening tremors and other numbness  Psychiatric/Behavioral: Negative for worsening agitation or other fatigue All other system neg per pt    Objective:   Physical Exam BP 122/80   Pulse 88   Temp 98.3 F (36.8 C) (Oral)   Ht 5\' 5"  (1.651 m)   Wt 182 lb (82.6 kg)   SpO2 97%   BMI 30.29 kg/m  VS noted,  Constitutional: Pt appears in NAD HENT: Head: NCAT.  Right Ear: External ear normal.  Left Ear: External ear normal.  Eyes: . Pupils are equal, round, and reactive to light. Conjunctivae and EOM are normal Nose: without d/c or deformity Neck: Neck supple. Gross normal ROM Cardiovascular: Normal rate and regular rhythm.   Pulmonary/Chest: Effort normal and breath sounds without rales or wheezing.  Abd:  Soft, NT, ND, + BS, no organomegaly Neurological: Pt is alert. At baseline orientation, motor grossly intact Skin: Skin is warm. No rashes, no LE edema, left upper lip just lateral to the midline with 1-2+ swelling, tender with wound intact and nonfluctuant,, no drainage or worsening erythema Psychiatric: Pt behavior is normal without agitation  No other exam findings Lab Results  Component Value Date   WBC 12.8 (H) 03/22/2018   HGB 11.4 (L) 03/22/2018   HCT 34.8 (L) 03/22/2018   PLT 375 03/22/2018   GLUCOSE 160 (H) 03/22/2018   CHOL 226 (H) 07/01/2017   TRIG 159.0 (H) 07/01/2017   HDL 77.50 07/01/2017   LDLDIRECT 124.1 10/30/2013   LDLCALC 117 (H) 07/01/2017   ALT 10 07/01/2017   AST 14 07/01/2017   NA 136 03/22/2018   K 3.1 (L) 03/22/2018   CL 104 03/22/2018   CREATININE 0.92 03/22/2018   BUN 9 03/22/2018   CO2 21 (L) 03/22/2018   TSH 1.24 07/01/2017         Assessment & Plan:

## 2018-07-08 NOTE — Assessment & Plan Note (Signed)
Stable, for a1c with next labs 

## 2018-07-08 NOTE — Assessment & Plan Note (Signed)
Mild to mod, for antibx course,  to f/u any worsening symptoms or concerns 

## 2018-07-08 NOTE — Assessment & Plan Note (Signed)
Ok for restart xarelto with planned f/u dec 2019 to consider stopping

## 2018-07-08 NOTE — Patient Instructions (Signed)
Please take all new medication as prescribed - the antibiotic  You are given the work note  Please continue all other medications as before, and refills have been done if requested.  Please have the pharmacy call with any other refills you may need.  Please keep your appointments with your specialists as you may have planned    

## 2018-11-04 DIAGNOSIS — N945 Secondary dysmenorrhea: Secondary | ICD-10-CM | POA: Diagnosis not present

## 2018-11-04 DIAGNOSIS — N920 Excessive and frequent menstruation with regular cycle: Secondary | ICD-10-CM | POA: Diagnosis not present

## 2018-11-04 DIAGNOSIS — Z113 Encounter for screening for infections with a predominantly sexual mode of transmission: Secondary | ICD-10-CM | POA: Diagnosis not present

## 2018-11-13 ENCOUNTER — Other Ambulatory Visit (INDEPENDENT_AMBULATORY_CARE_PROVIDER_SITE_OTHER): Payer: BLUE CROSS/BLUE SHIELD

## 2018-11-13 ENCOUNTER — Encounter: Payer: Self-pay | Admitting: Internal Medicine

## 2018-11-13 ENCOUNTER — Ambulatory Visit (INDEPENDENT_AMBULATORY_CARE_PROVIDER_SITE_OTHER): Payer: BLUE CROSS/BLUE SHIELD | Admitting: Internal Medicine

## 2018-11-13 VITALS — BP 136/88 | HR 88 | Temp 98.4°F | Ht 65.0 in | Wt 178.0 lb

## 2018-11-13 DIAGNOSIS — F419 Anxiety disorder, unspecified: Secondary | ICD-10-CM

## 2018-11-13 DIAGNOSIS — I82451 Acute embolism and thrombosis of right peroneal vein: Secondary | ICD-10-CM

## 2018-11-13 DIAGNOSIS — R739 Hyperglycemia, unspecified: Secondary | ICD-10-CM | POA: Diagnosis not present

## 2018-11-13 DIAGNOSIS — E782 Mixed hyperlipidemia: Secondary | ICD-10-CM

## 2018-11-13 DIAGNOSIS — M542 Cervicalgia: Secondary | ICD-10-CM

## 2018-11-13 DIAGNOSIS — Z0001 Encounter for general adult medical examination with abnormal findings: Secondary | ICD-10-CM

## 2018-11-13 DIAGNOSIS — N922 Excessive menstruation at puberty: Secondary | ICD-10-CM | POA: Diagnosis not present

## 2018-11-13 DIAGNOSIS — N924 Excessive bleeding in the premenopausal period: Secondary | ICD-10-CM | POA: Diagnosis not present

## 2018-11-13 LAB — BASIC METABOLIC PANEL
BUN: 6 mg/dL (ref 6–23)
CO2: 22 mEq/L (ref 19–32)
Calcium: 8.7 mg/dL (ref 8.4–10.5)
Chloride: 104 mEq/L (ref 96–112)
Creatinine, Ser: 0.7 mg/dL (ref 0.40–1.20)
GFR: 96.98 mL/min (ref >60.00)
Glucose, Bld: 83 mg/dL (ref 70–99)
POTASSIUM: 3.9 meq/L (ref 3.5–5.1)
Sodium: 136 mEq/L (ref 135–145)

## 2018-11-13 LAB — CBC WITH DIFFERENTIAL/PLATELET
Basophils Absolute: 0.1 10*3/uL (ref 0.0–0.1)
Basophils Relative: 1 % (ref 0.0–3.0)
EOS ABS: 0.2 10*3/uL (ref 0.0–0.7)
Eosinophils Relative: 3.2 % (ref 0.0–5.0)
HCT: 37.7 % (ref 36.0–46.0)
Hemoglobin: 12.7 g/dL (ref 12.0–15.0)
Lymphocytes Relative: 45.9 % (ref 12.0–46.0)
Lymphs Abs: 3.4 10*3/uL (ref 0.7–4.0)
MCHC: 33.6 g/dL (ref 30.0–36.0)
MCV: 88.9 fl (ref 78.0–100.0)
Monocytes Absolute: 0.4 10*3/uL (ref 0.1–1.0)
Monocytes Relative: 5.6 % (ref 3.0–12.0)
Neutro Abs: 3.3 10*3/uL (ref 1.4–7.7)
Neutrophils Relative %: 44.3 % (ref 43.0–77.0)
Platelets: 350 10*3/uL (ref 150.0–400.0)
RBC: 4.25 Mil/uL (ref 3.87–5.11)
RDW: 13.3 % (ref 11.5–15.5)
WBC: 7.4 10*3/uL (ref 4.0–10.5)

## 2018-11-13 LAB — TESTOSTERONE: Testosterone: 37.63 ng/dL (ref 15.00–40.00)

## 2018-11-13 LAB — URINALYSIS, ROUTINE W REFLEX MICROSCOPIC
Ketones, ur: 15 — AB
Nitrite: NEGATIVE
PH: 6 (ref 5.0–8.0)
Specific Gravity, Urine: 1.025 (ref 1.000–1.030)
Urine Glucose: NEGATIVE
Urobilinogen, UA: 0.2 (ref 0.0–1.0)

## 2018-11-13 LAB — HEPATIC FUNCTION PANEL
ALT: 9 U/L (ref 0–35)
AST: 12 U/L (ref 0–37)
Albumin: 4.1 g/dL (ref 3.5–5.2)
Alkaline Phosphatase: 58 U/L (ref 39–117)
Bilirubin, Direct: 0.1 mg/dL (ref 0.0–0.3)
Total Bilirubin: 0.9 mg/dL (ref 0.2–1.2)
Total Protein: 7.6 g/dL (ref 6.0–8.3)

## 2018-11-13 LAB — HEMOGLOBIN A1C: Hgb A1c MFr Bld: 5 % (ref 4.6–6.5)

## 2018-11-13 LAB — LIPID PANEL
Cholesterol: 248 mg/dL — ABNORMAL HIGH (ref 0–200)
HDL: 65.6 mg/dL (ref >39.00)
NonHDL: 182.17
Total CHOL/HDL Ratio: 4
Triglycerides: 282 mg/dL — ABNORMAL HIGH (ref 0.0–149.0)
VLDL: 56.4 mg/dL — AB (ref 0.0–40.0)

## 2018-11-13 LAB — FERRITIN: FERRITIN: 14.1 ng/mL (ref 10.0–291.0)

## 2018-11-13 LAB — LDL CHOLESTEROL, DIRECT: LDL DIRECT: 143 mg/dL

## 2018-11-13 LAB — IBC PANEL
Iron: 56 ug/dL (ref 42–145)
Saturation Ratios: 9.5 % — ABNORMAL LOW (ref 20.0–50.0)
Transferrin: 422 mg/dL — ABNORMAL HIGH (ref 212.0–360.0)

## 2018-11-13 LAB — FOLLICLE STIMULATING HORMONE: FSH: 0.4 m[IU]/mL

## 2018-11-13 LAB — TSH: TSH: 0.93 u[IU]/mL (ref 0.35–4.50)

## 2018-11-13 MED ORDER — CLONAZEPAM 1 MG PO TABS: 1.0000 mg | ORAL_TABLET | Freq: Two times a day (BID) | ORAL | 5 refills | Status: DC | PRN

## 2018-11-13 MED ORDER — IBUPROFEN 800 MG PO TABS: 800.0000 mg | ORAL_TABLET | Freq: Four times a day (QID) | ORAL | 2 refills | Status: DC | PRN

## 2018-11-13 MED ORDER — CYCLOBENZAPRINE HCL 5 MG PO TABS: 5.0000 mg | ORAL_TABLET | Freq: Three times a day (TID) | ORAL | 1 refills | Status: DC | PRN

## 2018-11-13 MED ORDER — ACETAMINOPHEN-CODEINE #3 300-30 MG PO TABS: 1.0000 | ORAL_TABLET | Freq: Four times a day (QID) | ORAL | 0 refills | Status: DC | PRN

## 2018-11-13 NOTE — Assessment & Plan Note (Signed)
stable overall by history and exam, recent data reviewed with pt, and pt to continue medical treatment as before,  to f/u any worsening symptoms or concerns  

## 2018-11-13 NOTE — Assessment & Plan Note (Signed)
For labs and GYN f/u

## 2018-11-13 NOTE — Assessment & Plan Note (Signed)
For a1c with lab 

## 2018-11-13 NOTE — Patient Instructions (Addendum)
OK to stop the xarelto completely for now  Please continue all other medications as before, and refills have been done if requested - the klonopin and tylenol #3  Please have the pharmacy call with any other refills you may need.  Please continue your efforts at being more active, low cholesterol diet, and weight control.  You are otherwise up to date with prevention measures today.  Please keep your appointments with your specialists as you may have planned - GYN   Please go to the LAB in the Basement (turn left off the elevator) for the tests to be done today  You will be contacted by phone if any changes need to be made immediately.  Otherwise, you will receive a letter about your results with an explanation, but please check with MyChart first.  Please remember to sign up for MyChart if you have not done so, as this will be important to you in the future with finding out test results, communicating by private email, and scheduling acute appointments online when needed.  Please return in 6 months, or sooner if needed

## 2018-11-13 NOTE — Progress Notes (Signed)
Subjective:    Patient ID: Lauren Gonzalez, female    DOB: 08/12/1986, 33 y.o.   MRN: [id number]  HPI  Here for wellness and f/u;  Overall doing ok;  Pt denies Chest pain, worsening SOB, DOE, wheezing, orthopnea, PND, worsening LE edema, palpitations, dizziness or syncope.  Pt denies neurological change such as new headache, facial or extremity weakness.  Pt denies polydipsia, polyuria, or low sugar symptoms. Pt states overall good compliance with treatment and medications, good tolerability, and has been trying to follow appropriate diet.  Pt denies worsening depressive symptoms, suicidal ideation or panic. No fever, night sweats, wt loss, loss of appetite, or other constitutional symptoms.  Pt states good ability with ADL's, has low fall risk, home safety reviewed and adequate, no other significant changes in hearing or vision, and only occasionally active with exercise. Wt Readings from Last 3 Encounters:  11/13/18 178 lb (80.7 kg)  07/08/18 182 lb (82.6 kg)  05/01/18 181 lb (82.1 kg)  Also Having very heavy menses when on xarelto and has held on taking on the worst bleeding days.  Even had to use the tyleneol #3 for uterine pain. Now off xarelto recently since menses heavy started, Has not taken since xarelto since jan 13.   Past Medical History:  Diagnosis Date  . ADD (attention deficit disorder) 12/29/2013  . Allergic rhinitis, cause unspecified   . DISORDER, BIPOLAR NEC   . Insomnia, unspecified   . MIGRAINE HEADACHE    Past Surgical History:  Procedure Laterality Date  . CHOLECYSTECTOMY    . FRENULECTOMY, LINGUAL     of the base of the tongue  . s/p left hip labral tear surgury  dec. 2010  . TONSILLECTOMY    . TONSILLECTOMY N/A 03/22/2018   Procedure: CAUTERY OF POST TONSILLAR BLEEDING;  Surgeon: Christia Reading, MD;  Location: [facility] OR;  Service: ENT;  Laterality: N/A;  . TYMPANOSTOMY TUBE PLACEMENT     as a child  . WISDOM TOOTH EXTRACTION      reports that she has quit smoking.  She has never used smokeless tobacco. She reports current alcohol use. No history on file for drug. family history includes Arthritis in an other family member; Cancer in an other family member; Colon polyps in her father; Depression in an other family member; Diabetes in an other family member; Heart disease in an other family member; Stroke in an other family member. Allergies  Allergen Reactions  . Influenza Vaccines Rash  . Levaquin [Levofloxacin] Hives and Rash   Current Outpatient Medications on File Prior to Visit  Medication Sig Dispense Refill  . albuterol (PROVENTIL HFA;VENTOLIN HFA) 108 (90 Base) MCG/ACT inhaler Inhale 2 puffs into the lungs every 6 (six) hours as needed for wheezing or shortness of breath. 1 Inhaler 0  . etonogestrel-ethinyl estradiol (NUVARING) 0.12-0.015 MG/24HR vaginal ring Place 1 each vaginally every 28 (twenty-eight) days. Insert vaginally and leave in place for 3 consecutive weeks, then remove for 1 week. 1 each 0  . HYDROcodone-acetaminophen (HYCET) 7.5-325 mg/15 ml solution Take 15 mLs by mouth every 6 (six) hours as needed for moderate pain. 250 mL 0  . rivaroxaban (XARELTO) 20 MG TABS tablet Take 1 tablet (20 mg total) by mouth daily with supper. 30 tablet 4  . zolpidem (AMBIEN CR) 12.5 MG CR tablet Take 1 tablet (12.5 mg total) by mouth at bedtime as needed for sleep. 90 tablet 1   No current facility-administered medications on file prior to visit.  Review of Systems Constitutional: Negative for other unusual diaphoresis, sweats, appetite or weight changes HENT: Negative for other worsening hearing loss, ear pain, facial swelling, mouth sores or neck stiffness.   Eyes: Negative for other worsening pain, redness or other visual disturbance.  Respiratory: Negative for other stridor or swelling Cardiovascular: Negative for other palpitations or other chest pain  Gastrointestinal: Negative for worsening diarrhea or loose stools, blood in stool,  distention or other pain Genitourinary: Negative for hematuria, flank pain or other change in urine volume.  Musculoskeletal: Negative for myalgias or other joint swelling.  Skin: Negative for other color change, or other wound or worsening drainage.  Neurological: Negative for other syncope or numbness. Hematological: Negative for other adenopathy or swelling Psychiatric/Behavioral: Negative for hallucinations, other worsening agitation, SI, self-injury, or new decreased concentration All other system neg per pt    Objective:   Physical Exam BP 136/88   Pulse 88   Temp 98.4 F (36.9 C) (Oral)   Ht 5\' 5"  (1.651 m)   Wt 178 lb (80.7 kg)   SpO2 98%   BMI 29.62 kg/m  VS noted,  Constitutional: Pt is oriented to person, place, and time. Appears well-developed and well-nourished, in no significant distress and comfortable Head: Normocephalic and atraumatic  Eyes: Conjunctivae and EOM are normal. Pupils are equal, round, and reactive to light Right Ear: External ear normal without discharge Left Ear: External ear normal without discharge Nose: Nose without discharge or deformity Mouth/Throat: Oropharynx is without other ulcerations and moist  Neck: Normal range of motion. Neck supple. No JVD present. No tracheal deviation present or significant neck LA or mass Cardiovascular: Normal rate, regular rhythm, normal heart sounds and intact distal pulses.   Pulmonary/Chest: WOB normal and breath sounds without rales or wheezing  Abdominal: Soft. Bowel sounds are normal. NT. No HSM  Musculoskeletal: Normal range of motion. Exhibits no edema Lymphadenopathy: Has no other cervical adenopathy.  Neurological: Pt is alert and oriented to person, place, and time. Pt has normal reflexes. No cranial nerve deficit. Motor grossly intact, Gait intact Skin: Skin is warm and dry. No rash noted or new ulcerations Psychiatric:  Has normal mood and affect. Behavior is normal without agitation No other exam  findings Lab Results  Component Value Date   WBC 7.4 11/13/2018   HGB 12.7 11/13/2018   HCT 37.7 11/13/2018   PLT 350.0 11/13/2018   GLUCOSE 83 11/13/2018   CHOL 248 (H) 11/13/2018   TRIG 282.0 (H) 11/13/2018   HDL 65.60 11/13/2018   LDLDIRECT 143.0 11/13/2018   LDLCALC 117 (H) 07/01/2017   ALT 9 11/13/2018   AST 12 11/13/2018   NA 136 11/13/2018   K 3.9 11/13/2018   CL 104 11/13/2018   CREATININE 0.70 11/13/2018   BUN 6 11/13/2018   CO2 22 11/13/2018   TSH 0.93 11/13/2018   HGBA1C 5.0 11/13/2018        Assessment & Plan:

## 2018-11-13 NOTE — Assessment & Plan Note (Signed)
For lower chol diet 

## 2018-11-13 NOTE — Assessment & Plan Note (Addendum)
S/p 6 mo xarelto for postop first time DVT (also on Nuvaring); to d/c xarelto, check hypercoag panel, and hopefully to help reduce severity of menorrhagia

## 2018-11-14 LAB — ESTRADIOL: Estradiol: <15 pg/mL

## 2018-11-24 DIAGNOSIS — N946 Dysmenorrhea, unspecified: Secondary | ICD-10-CM | POA: Diagnosis not present

## 2018-11-24 DIAGNOSIS — N920 Excessive and frequent menstruation with regular cycle: Secondary | ICD-10-CM | POA: Diagnosis not present

## 2018-11-26 LAB — HYPERCOAGULABLE PANEL, COMPREHENSIVE
APTT: 26.9 s
AT III ACT/NOR PPP CHRO: 115 %
Act. Prt C Resist w/FV Defic.: 2.7 ratio
Anticardiolipin Ab, IgG: <10 [GPL'U]
Anticardiolipin Ab, IgM: <10 [MPL'U]
Beta-2 Glycoprotein I, IgA: <10 SAU
Beta-2 Glycoprotein I, IgG: <10 SGU
Beta-2 Glycoprotein I, IgM: <10 SMU
DRVVT Confirm Seconds: 36.3 s
DRVVT Ratio: 1.4 ratio — ABNORMAL HIGH
DRVVT Screen Seconds: 52.4 s — ABNORMAL HIGH
FACTOR VIII ACTIVITY: 60 %
Factor VII Antigen**: 302 % — ABNORMAL HIGH
HEXAGONAL PHOSPHOLIPID NEUTRAL: 0 s
Homocysteine: 7.5 umol/L
Prot C Ag Act/Nor PPP Imm: 148 %
Prot S Ag Act/Nor PPP Imm: 63 %
Protein C Ag/FVII Ag Ratio**: 0.5 ratio
Protein S Ag/FVII Ag Ratio**: 0.2 ratio — ABNORMAL LOW

## 2018-12-29 ENCOUNTER — Other Ambulatory Visit: Payer: Self-pay | Admitting: Internal Medicine

## 2018-12-29 DIAGNOSIS — G4700 Insomnia, unspecified: Secondary | ICD-10-CM

## 2018-12-29 NOTE — Telephone Encounter (Signed)
Done erx 

## 2019-04-21 DIAGNOSIS — N946 Dysmenorrhea, unspecified: Secondary | ICD-10-CM | POA: Diagnosis not present

## 2019-04-21 DIAGNOSIS — Z30011 Encounter for initial prescription of contraceptive pills: Secondary | ICD-10-CM | POA: Diagnosis not present

## 2019-04-21 DIAGNOSIS — R6882 Decreased libido: Secondary | ICD-10-CM | POA: Diagnosis not present

## 2019-04-21 DIAGNOSIS — Z130 Encounter for screening for diseases of the blood and blood-forming organs and certain disorders involving the immune mechanism: Secondary | ICD-10-CM | POA: Diagnosis not present

## 2019-04-21 DIAGNOSIS — Z01419 Encounter for gynecological examination (general) (routine) without abnormal findings: Secondary | ICD-10-CM | POA: Diagnosis not present

## 2019-04-22 DIAGNOSIS — Z124 Encounter for screening for malignant neoplasm of cervix: Secondary | ICD-10-CM | POA: Diagnosis not present

## 2019-05-08 IMAGING — CT CT ANGIO CHEST
2 of 9 series · 19 of 46 positions shown · IV contrast (iopamidol)
Comparison: None.

CLINICAL DATA: Dyspnea on exertion with findings of acute DVT

EXAM:
CT ANGIOGRAPHY CHEST WITH CONTRAST
TECHNIQUE: Multidetector CT imaging of the chest was performed using the
standard protocol during bolus administration of intravenous
contrast. Multiplanar CT image reconstructions and MIPs were
obtained to evaluate the vascular anatomy.
CONTRAST:  80mL SIJZB5-XOE IOPAMIDOL (SIJZB5-XOE) INJECTION 76%

[Series 6: thins · axial · 0.70mm/px · z∈[+1220,+1460]mm · 16 of 271 slices shown]
[im 16/271  lung]
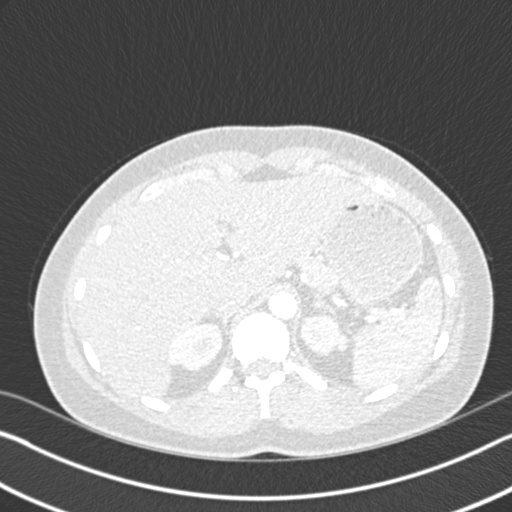
[im 31/271  soft-tissue]
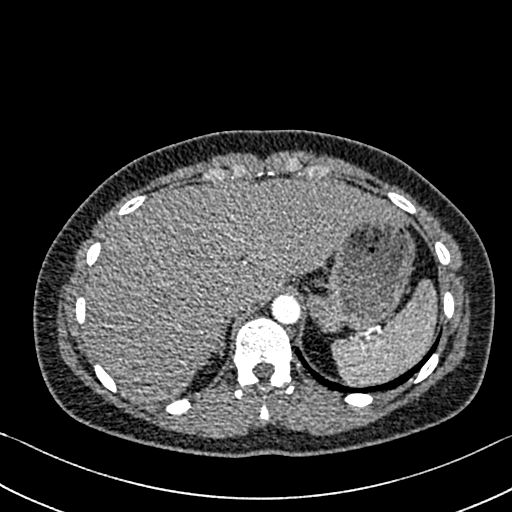
[im 46/271  lung]
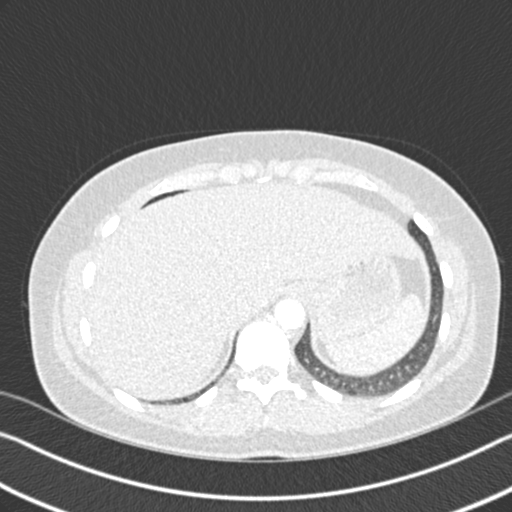
[im 61/271  soft-tissue]
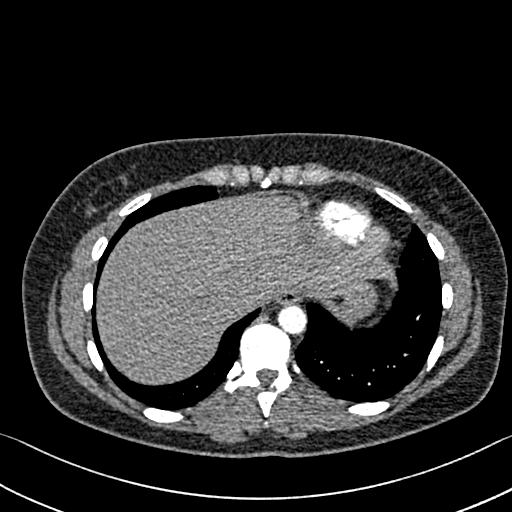
[im 76/271  lung]
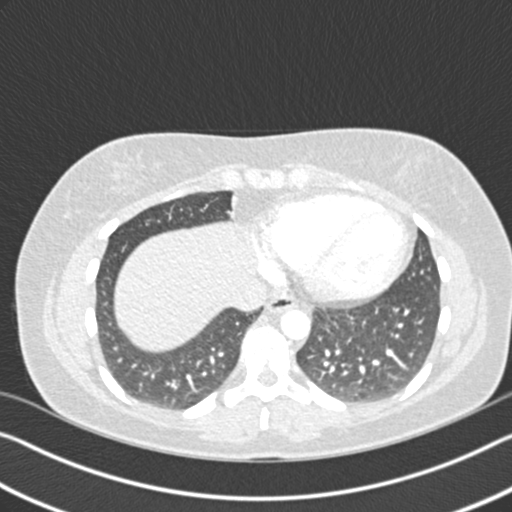
[im 91/271  soft-tissue]
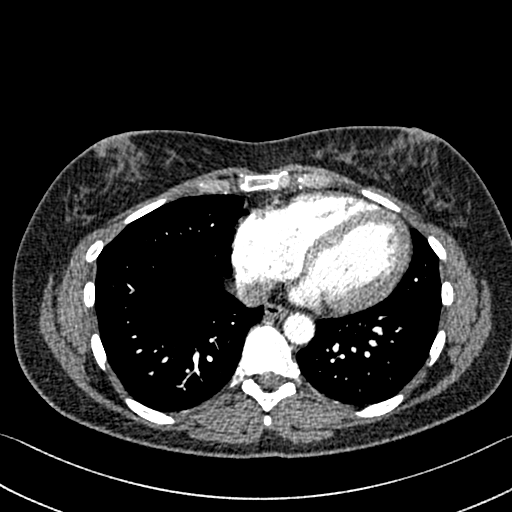
[im 106/271  lung]
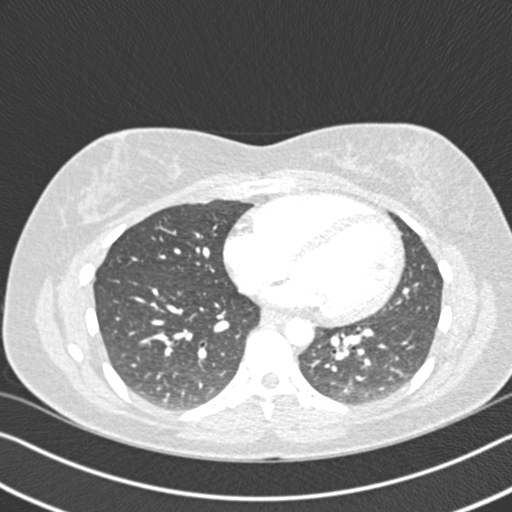
[im 121/271  soft-tissue]
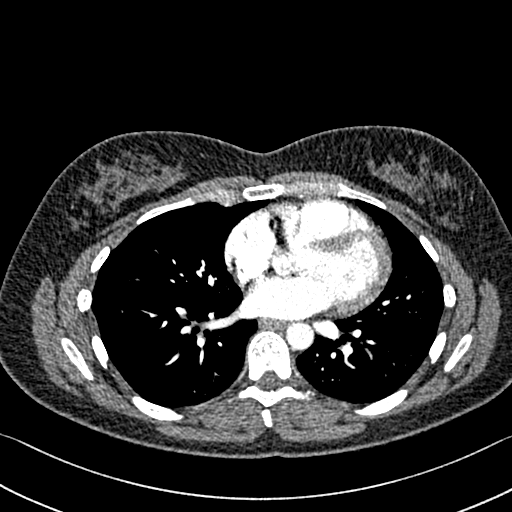
[im 151/271  lung]
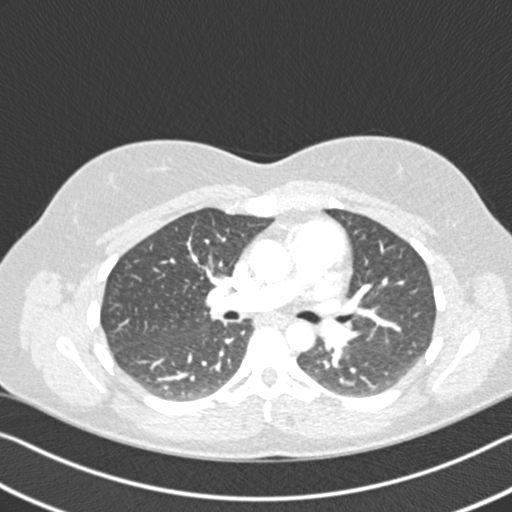
[im 166/271  soft-tissue]
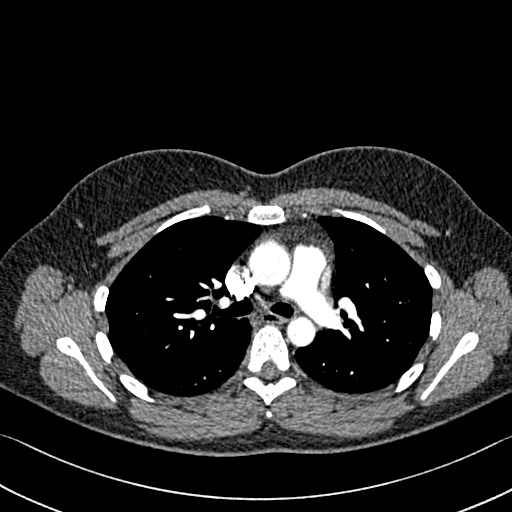
[im 181/271  lung]
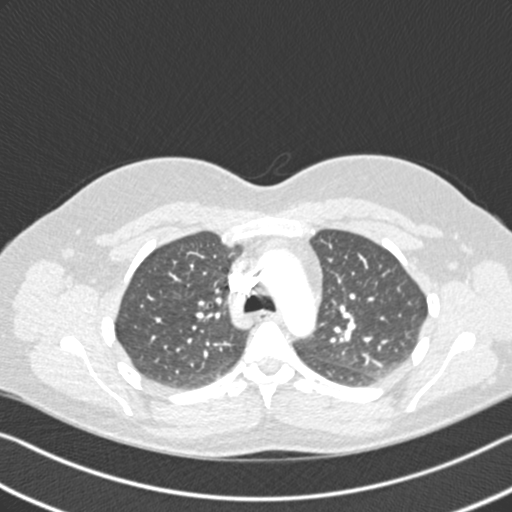
[im 196/271  soft-tissue]
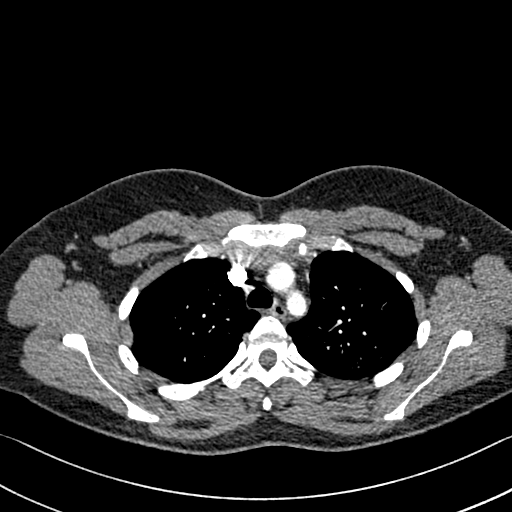
[im 211/271  lung]
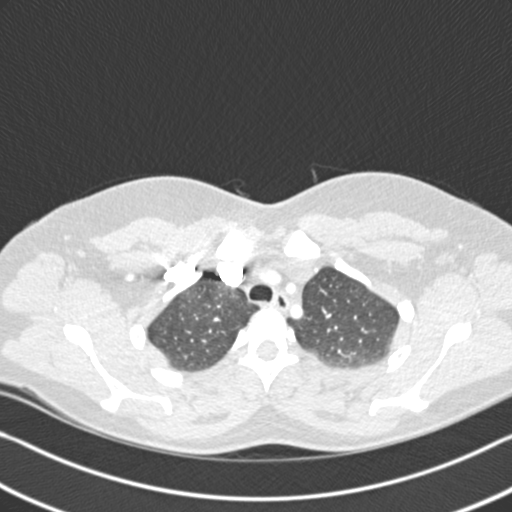
[im 226/271  soft-tissue]
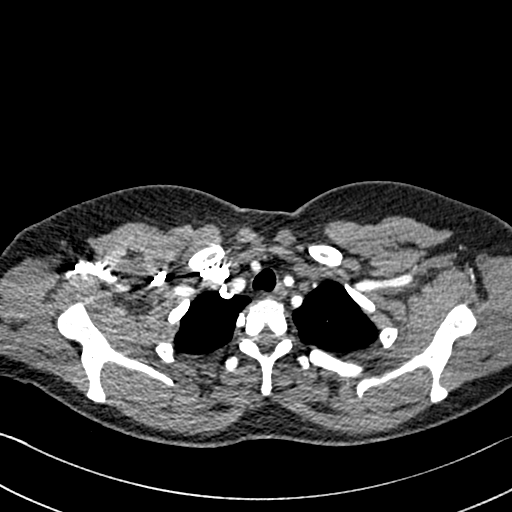
[im 241/271  lung]
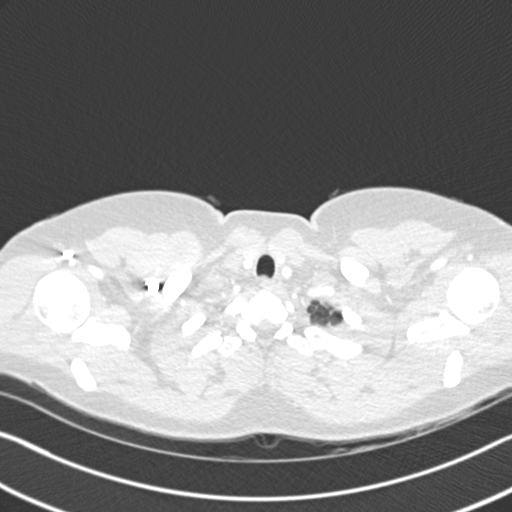
[im 256/271  soft-tissue]
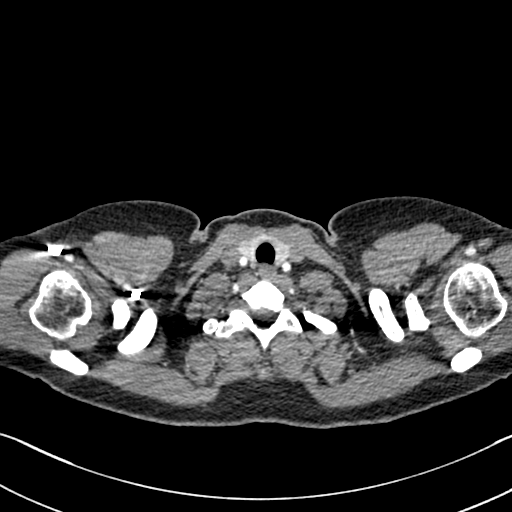

[Series 8: coronal mpr · coronal · 0.59mm/px · 3 of 125 slices shown]
[im 32/125  soft-tissue]
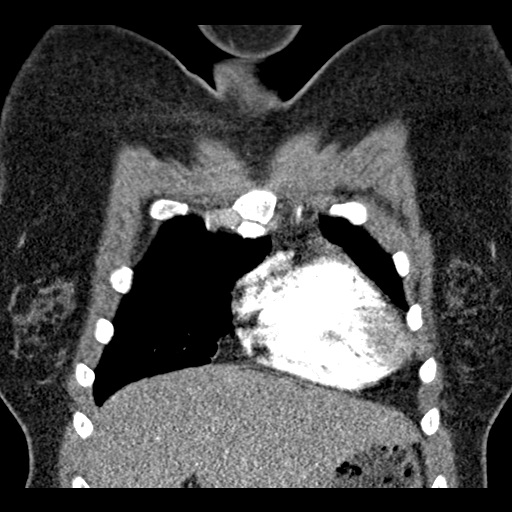
[im 63/125  soft-tissue]
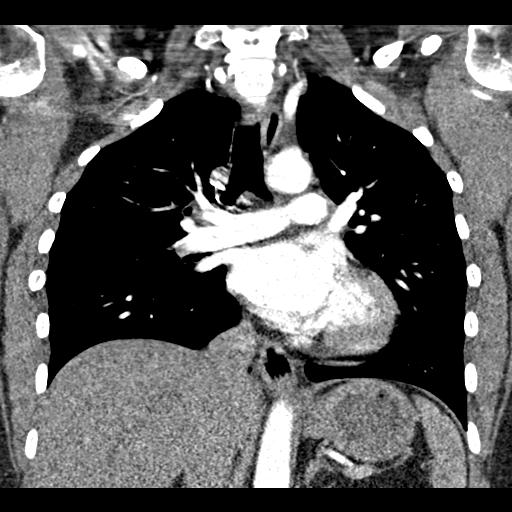
[im 94/125  soft-tissue]
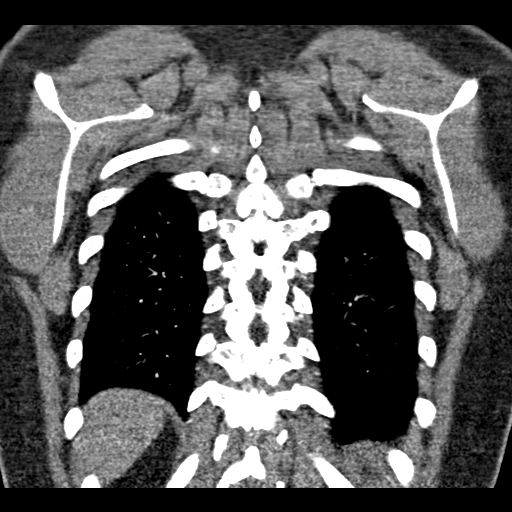

[19 of 46 positions shown; findings below may reference images not displayed]

FINDINGS: Cardiovascular: Thoracic aorta is within normal limits. No
aneurysmal dilatation or dissection is seen. No cardiac enlargement
is noted. No coronary calcifications are seen. The pulmonary artery
is well visualized within normal branching pattern. No filling
defects to suggest pulmonary emboli are identified.

Mediastinum/Nodes: Thoracic inlet is within normal limits. No hilar
or mediastinal adenopathy is noted. The esophagus is within normal
limits.

Lungs/Pleura: Lungs are clear. No pleural effusion or pneumothorax.

Upper Abdomen: Visualized upper abdomen shows no acute abnormality.
A hypodensity is noted within the left kidney likely representing a
small cyst.

Musculoskeletal: No chest wall abnormality. No acute or significant
osseous findings.

Review of the MIP images confirms the above findings.
IMPRESSION: No evidence of pulmonary emboli.

No acute abnormality noted.

## 2019-05-14 ENCOUNTER — Ambulatory Visit (INDEPENDENT_AMBULATORY_CARE_PROVIDER_SITE_OTHER): Payer: BC Managed Care – PPO | Admitting: Internal Medicine

## 2019-05-14 ENCOUNTER — Telehealth: Payer: Self-pay | Admitting: Emergency Medicine

## 2019-05-14 DIAGNOSIS — F419 Anxiety disorder, unspecified: Secondary | ICD-10-CM | POA: Diagnosis not present

## 2019-05-14 DIAGNOSIS — G43809 Other migraine, not intractable, without status migrainosus: Secondary | ICD-10-CM | POA: Diagnosis not present

## 2019-05-14 DIAGNOSIS — R739 Hyperglycemia, unspecified: Secondary | ICD-10-CM

## 2019-05-14 DIAGNOSIS — G43909 Migraine, unspecified, not intractable, without status migrainosus: Secondary | ICD-10-CM | POA: Insufficient documentation

## 2019-05-14 MED ORDER — CLONAZEPAM 1 MG PO TABS: 1.0000 mg | ORAL_TABLET | Freq: Two times a day (BID) | ORAL | 5 refills | Status: DC | PRN

## 2019-05-14 MED ORDER — FROVATRIPTAN SUCCINATE 2.5 MG PO TABS: 2.5000 mg | ORAL_TABLET | ORAL | 11 refills | Status: DC | PRN

## 2019-05-14 NOTE — Telephone Encounter (Signed)
Ok this is done 

## 2019-05-14 NOTE — Patient Instructions (Addendum)
Please take all new medication as prescribed - the Frova as needed for menstrual migraine  Please continue all other medications as before, including the klonopin refilled today  We will fill out the FMLA with total max of 4 days per month missed work only  Please have the pharmacy call with any other refills you may need.  Please keep your appointments with your specialists as you may have planned  You will be contacted regarding the referral for: neurology

## 2019-05-14 NOTE — Telephone Encounter (Signed)
Pt came in office to give Lauren Gonzalez her FMLA paperwork. Pt stated Dr Jenny Reichmann discussed a consultation with the neurologist for botox injections at her doxy appt today. She declined at that time but has changed her mind and is requesting the consultation now. Can you check into this please.

## 2019-05-14 NOTE — Progress Notes (Signed)
Patient ID: Lauren Gonzalez, female   DOB: [birth year], 33 y.o.   MRN: [id number]  Virtual Visit via Video Note  I connected with Lauren Gonzalez on 05/14/19 at  9:20 AM EDT by a video enabled telemedicine application and verified that I am speaking with the correct person using two identifiers.  Location: Patient: at home Provider: at office   I discussed the limitations of evaluation and management by telemedicine and the availability of in person appointments. The patient expressed understanding and agreed to proceed.  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  Asks for FMLA up to 4 days per wk.  Did see GYN with recent change of nuvaring to progesterone.   Pt denies fever, wt loss, night sweats, loss of appetite, or other constitutional symptoms  Pt denies chest pain, increased sob or doe, wheezing, orthopnea, PND, increased LE swelling, palpitations, dizziness or syncope.  Pt denies new neurological symptoms such as new facial or extremity weakness or numbness   Denies worsening depressive symptoms, suicidal ideation, or panic; has ongoing anxiety Past Medical History:  Diagnosis Date  . ADD (attention deficit disorder) 12/29/2013  . Allergic rhinitis, cause unspecified   . DISORDER, BIPOLAR NEC   . Insomnia, unspecified   . MIGRAINE HEADACHE    Past Surgical History:  Procedure Laterality Date  . CHOLECYSTECTOMY    . FRENULECTOMY, LINGUAL     of the base of the tongue  . s/p left hip labral tear surgury  dec. 2010  . TONSILLECTOMY    . TONSILLECTOMY N/A 03/22/2018   Procedure: CAUTERY OF POST TONSILLAR BLEEDING;  Surgeon: Melida Quitter, MD;  Location: [place] [facility];  Service: ENT;  Laterality: N/A;  . TYMPANOSTOMY TUBE PLACEMENT     as a child  . WISDOM TOOTH  EXTRACTION      reports that she has quit smoking. She has never used smokeless tobacco. She reports current alcohol use. No history on file for drug. family history includes Arthritis in an other family member; Cancer in an other family member; Colon polyps in her father; Depression in an other family member; Diabetes in an other family member; Heart disease in an other family member; Stroke in an other family member. Allergies  Allergen Reactions  . Influenza Vaccines Rash  . Levaquin [Levofloxacin] Hives and Rash   Current Outpatient Medications on File Prior to Visit  Medication Sig Dispense Refill  . acetaminophen-codeine (TYLENOL #3) 300-30 MG tablet Take 1 tablet by mouth every 6 (six) hours as needed for moderate pain. 30 tablet 0  . albuterol (PROVENTIL HFA;VENTOLIN HFA) 108 (90 Base) MCG/ACT inhaler Inhale 2 puffs into the lungs every 6 (six) hours as needed for wheezing or shortness of breath. 1 Inhaler 0  . cyclobenzaprine (FLEXERIL) 5 MG tablet Take 1 tablet (5 mg total) by mouth 3 (three) times daily as needed for muscle spasms. 40 tablet 1  . etonogestrel-ethinyl estradiol (NUVARING) 0.12-0.015 MG/24HR vaginal ring Place 1 each vaginally every 28 (twenty-eight) days. Insert vaginally and leave in place for 3 consecutive weeks, then remove for 1 week. 1 each 0  . ibuprofen (ADVIL,MOTRIN) 800 MG tablet Take 1 tablet (800 mg total) by mouth every 6 (six) hours as needed. 60 tablet 2  . rivaroxaban (XARELTO) 20 MG TABS tablet Take 1 tablet (  20 mg total) by mouth daily with supper. 30 tablet 4  . zolpidem (AMBIEN CR) 12.5 MG CR tablet TAKE 1 TABLET(12.5 MG) BY MOUTH AT BEDTIME AS NEEDED FOR SLEEP 90 tablet 1   No current facility-administered medications on file prior to visit.    Observations/Objective: Alert, NAD, appropriate mood and affect, resps normal, cn 2-12 intact, moves all 4s, no visible rash or swelling Lab Results  Component Value Date   WBC 7.4 11/13/2018   HGB 12.7  11/13/2018   HCT 37.7 11/13/2018   PLT 350.0 11/13/2018   GLUCOSE 83 11/13/2018   CHOL 248 (H) 11/13/2018   TRIG 282.0 (H) 11/13/2018   HDL 65.60 11/13/2018   LDLDIRECT 143.0 11/13/2018   LDLCALC 117 (H) 07/01/2017   ALT 9 11/13/2018   AST 12 11/13/2018   NA 136 11/13/2018   K 3.9 11/13/2018   CL 104 11/13/2018   CREATININE 0.70 11/13/2018   BUN 6 11/13/2018   CO2 22 11/13/2018   TSH 0.93 11/13/2018   HGBA1C 5.0 11/13/2018   Assessment and Plan: See notes  Follow Up Instructions: See notes   I discussed the assessment and treatment plan with the patient. The patient was provided an opportunity to ask questions and all were answered. The patient agreed with the plan and demonstrated an understanding of the instructions.   The patient was advised to call back or seek an in-person evaluation if the symptoms worsen or if the condition fails to improve as anticipated   Oliver BarreJames , MD

## 2019-05-17 ENCOUNTER — Encounter: Payer: Self-pay | Admitting: Internal Medicine

## 2019-05-17 NOTE — Assessment & Plan Note (Signed)
stable overall by history and exam, recent data reviewed with pt, and pt to continue medical treatment as before,  to f/u any worsening symptoms or concerns  

## 2019-05-17 NOTE — Assessment & Plan Note (Signed)
With worsening premenstrual symptoms, for frova prn, also refer neurology and pt interested in botox preventive

## 2019-05-18 DIAGNOSIS — Z0279 Encounter for issue of other medical certificate: Secondary | ICD-10-CM

## 2019-05-18 NOTE — Telephone Encounter (Signed)
[place] for 4 days per month off work due to migraine

## 2019-05-18 NOTE — Telephone Encounter (Signed)
Forms have been completed & placed in providers box to review and sign.  °

## 2019-05-18 NOTE — Telephone Encounter (Signed)
Forms have been signed, Faxed to [employer] @ [phone], Copy sent to scan &charged for.   Patient informed, &Will pick up original.

## 2019-05-18 NOTE — Telephone Encounter (Signed)
Do you have this paperwork?

## 2019-05-18 NOTE — Telephone Encounter (Signed)
Patient called in checking on status for FMLA paperwork so she can come and pick it up. Please advise.

## 2019-05-18 NOTE — Telephone Encounter (Signed)
Paperwork was not upfront

## 2019-05-21 DIAGNOSIS — N939 Abnormal uterine and vaginal bleeding, unspecified: Secondary | ICD-10-CM | POA: Diagnosis not present

## 2019-05-29 ENCOUNTER — Encounter: Payer: Self-pay | Admitting: Neurology

## 2019-05-31 NOTE — Progress Notes (Signed)
Virtual Visit via Video Note The purpose of this virtual visit is to provide medical care while limiting exposure to the novel coronavirus.    Consent was obtained for video visit:  Yes Answered questions that patient had about telehealth interaction:  Yes I discussed the limitations, risks, security and privacy concerns of performing an evaluation and management service by telemedicine. I also discussed with the patient that there may be a patient responsible charge related to this service. The patient expressed understanding and agreed to proceed.  Pt location: Home Physician Location: Home Name of referring provider:  Corwin Gonzalez, James W, MD I connected with Lauren AquasHeather L Gonzalez at patients initiation/request on 06/01/2019 at  9:10 AM EDT by video enabled telemedicine application and verified that I am speaking with the correct person using two identifiers. Pt MRN:  [id number] Pt DOB:  [birth year] Video Participants:  Lauren Gonzalez   History of Present Illness:  Lauren Gonzalez is a 33 year old female with history of DVT who presents for migraines.  History supplemented by referring provider note.  Onset:  Since highschool.  Worse over past year, worse related to menstrual cycle. Location:  Starts  Unilateral (either side) and the becomes bilatera, frontal, face, temples, radiates to back of head Quality:  Starts as pressure and becomes pounding/throbbing Intensity:  Severe.  She denies new headache, thunderclap headache or severe headache that wakes her from sleep. Aura:  none Premonitory Phase:  Feels slightly "off"  Postdrome:  Tired Associated symptoms:  Nausea, vomiting, photophobia, phonophobia, osmophobia, hair and teeth hurt.  She denies associated unilateral numbness or weakness. Duration:  All day (sometimes in to next day) Frequency:  Mild-moderate migraine just before her period.  Severe during her period, may have one other one outside period.  (total 4 days a month) Frequency of  abortive medication: 4 days a month Triggers:  Menses, weather changes, certain smells, emotional stress, caffeine-withdrawal, sleep deprivation) Relieving factors:  Coolness, dark place Activity: aggravates  MRI of brain with and without contrat from 11/09/04 personally reviewed and was normal.  Rescue therapy:  Tylenol #3.  At work, takes ibuprofen and energy drink. Current NSAIDS:  ibuprofen Current analgesics:  Tylenol #3 Current triptans:  None (prescribed Frova 2.5mg  but has not started taking yet) Current ergotamine:  none Current anti-emetic:  Phenergan PR Current muscle relaxants:  Cyclobenzaprine 5mg  Current anti-anxiolytic:  Clonazepam 1mg  twice daily PRN Current sleep aide:  Ambien Current Antihypertensive medications:  none Current Antidepressant medications:  none Current Anticonvulsant medications:  none Current anti-CGRP:  none Current Vitamins/Herbal/Supplements:  none Current Antihistamines/Decongestants:  none Other therapy:  none Hormone/birth control:  Norlyda (progesterone)  Past NSAIDS:  Cambia, naproxen Past analgesics:  Excedrin, BC powder Past abortive triptans:  Sumatriptan 100mg , Zomig 5mg  NS (effective but felt in a daze all day) Past abortive ergotamine:  none Past muscle relaxants:  Robaxin Past anti-emetic:  promethazine Past antihypertensive medications:  none Past antidepressant/antipsychotic medications:  Sertraline, Vyanse Past anticonvulsant medications:  topiramate 50mg  twice daily (paresthesias) Past anti-CGRP:  none Past vitamins/Herbal/Supplements:  Magnesium, CoQ10 Past antihistamines/decongestants:  Zyrtec Other past therapies:  Changing birth control from Nuvaring to progesterone, trigger point injections/nerve blocks  Caffeine:  1 cup coffee a week.  Energy drink infrequently.  Occasional Pepsi Diet:  At least 64 oz water.  Occasional soda (Sprite, orange soda) Exercise:  Not routine Depression and Anxiety.  Has Bipolar disorder.   Currently stable. Other pain:  no Sleep hygiene:  Good when she is  not working.  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 Family history of headache:  Dad (migraines, resolved)  Past Medical History: Past Medical History:  Diagnosis Date  . ADD (attention deficit disorder) 12/29/2013  . Allergic rhinitis, cause unspecified   . DISORDER, BIPOLAR NEC   . DVT (deep venous thrombosis) (HCC)    in leg July 2020 (vaginal ring discontinued after blood clot/started on Xarelto - off now)  . Insomnia, unspecified   . MIGRAINE HEADACHE     Medications: Outpatient Encounter Medications as of 06/01/2019  Medication Sig Note  . acetaminophen-codeine (TYLENOL #3) 300-30 MG tablet Take 1 tablet by mouth every 6 (six) hours as needed for moderate pain.   Marland Kitchen. albuterol (PROVENTIL HFA;VENTOLIN HFA) 108 (90 Base) MCG/ACT inhaler Inhale 2 puffs into the lungs every 6 (six) hours as needed for wheezing or shortness of breath.   . clonazePAM (KLONOPIN) 1 MG tablet Take 1 tablet (1 mg total) by mouth 2 (two) times daily as needed for anxiety.   . cyclobenzaprine (FLEXERIL) 5 MG tablet Take 1 tablet (5 mg total) by mouth 3 (three) times daily as needed for muscle spasms.   Marland Kitchen. ibuprofen (ADVIL,MOTRIN) 800 MG tablet Take 1 tablet (800 mg total) by mouth every 6 (six) hours as needed.   . NORLYDA 0.35 MG tablet TK 1 T PO QD   . zolpidem (AMBIEN CR) 12.5 MG CR tablet TAKE 1 TABLET(12.5 MG) BY MOUTH AT BEDTIME AS NEEDED FOR SLEEP   . etonogestrel-ethinyl estradiol (NUVARING) 0.12-0.015 MG/24HR vaginal ring Place 1 each vaginally every 28 (twenty-eight) days. Insert vaginally and leave in place for 3 consecutive weeks, then remove for 1 week. 05/29/2019: Took off this July 2002 due to DVT in leg   . frovatriptan (FROVA) 2.5 MG tablet Take 1 tablet (2.5 mg total) by mouth as needed for migraine. If recurs, may repeat after 2 hours. Max of 3 tabs in 24 hours. 05/29/2019: Has not started taking yet  .  [DISCONTINUED] rivaroxaban (XARELTO) 20 MG TABS tablet Take 1 tablet (20 mg total) by mouth daily with supper.    No facility-administered encounter medications on file as of 06/01/2019.     Allergies: Allergies  Allergen Reactions  . Influenza Vaccines Rash  . Levaquin [Levofloxacin] Hives and Rash    Family History: Family History  Problem Relation Age of Onset  . Colon polyps Father   . Depression Other   . Diabetes Other   . Stroke Other   . Heart disease Other   . Cancer Other        lung and ovarian cancer  . Arthritis Other     Social History: Social History   Socioeconomic History  . Marital status: Single    Spouse name: Not on file  . Number of children: Not on file  . Years of education: Not on file  . Highest education level: Not on file  Occupational History  . Occupation: Med. surg program at [employer]    Employer: [employer]  Social Needs  . Financial resource strain: Not on file  . Food insecurity    Worry: Not on file    Inability: Not on file  . Transportation needs    Medical: Not on file    Non-medical: Not on file  Tobacco Use  . Smoking status: Former Games developermoker  . Smokeless tobacco: Never Used  Substance and Sexual Activity  . Alcohol use: Yes    Comment:  2 bottles of wine/week  (not every week)  . Drug use: Yes    Types: Marijuana    Comment: no marijuana  anymore - uses CBD oil via  vaping   . Sexual activity: Not on file  Lifestyle  . Physical activity    Days per week: Not on file    Minutes per session: Not on file  . Stress: Not on file  Relationships  . Social Herbalist on phone: Not on file    Gets together: Not on file    Attends religious service: Not on file    Active member of club or organization: Not on file    Attends meetings of clubs or organizations: Not on file    Relationship status: Not on file  . Intimate partner violence    Fear of current or ex partner: Not on file    Emotionally  abused: Not on file    Physically abused: Not on file    Forced sexual activity: Not on file  Other Topics Concern  . Not on file  Social History Narrative   LIves in one level home with boyfriend. R handed. 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 Caffeine - drinks 16 oz soda a day but some days it doesn't have caffeine as is sprite/fanta. No regular exercise program.    Observations/Objective:   Blood pressure 140/90, temperature 98 F (36.7 C), height 5\' 5"  (1.651 m), weight 180 lb (81.6 kg), last menstrual period 05/29/2019. No acute distress.  Alert and oriented.  Speech fluent and not dysarthric.  Language intact.  Eyes orthophoric on primary gaze.  Face symmetric.  Assessment and Plan:   Menstrual migraines, not intractable  1.  For preventative management, we will start Aimovig 70mg  monthly 2.  For abortive therapy, she will try Ubrelvy 100mg .  Hopefully this will be effective and not cause drowsiness, so it can be an option for her if she has a migraine while working.  Otherwise, she may try the Frova.  Stop Tylenol #3. 3.  Limit use of pain relievers to no more than 2 days out of week to prevent risk of rebound or medication-overuse headache. 4.  Keep headache diary 5.  Exercise, hydration, caffeine cessation, sleep hygiene, monitor for and avoid triggers 6.  Consider:  magnesium citrate 400mg  daily, riboflavin 400mg  daily, and coenzyme Q10 100mg  three times daily 7. Always keep in mind that currently taking a hormone or birth control may be a possible trigger or aggravating factor for migraine. 8. Follow up 4 months.   Follow Up Instructions:    -I discussed the assessment and treatment plan with the patient. The patient was provided an opportunity to ask questions and all were answered. The patient agreed with the plan and demonstrated an understanding of the instructions.   The patient was advised to call back or seek an in-person evaluation if the symptoms worsen or if the  condition fails to improve as anticipated.    Dudley Major, DO

## 2019-06-01 ENCOUNTER — Telehealth (INDEPENDENT_AMBULATORY_CARE_PROVIDER_SITE_OTHER): Payer: BC Managed Care – PPO | Admitting: Neurology

## 2019-06-01 ENCOUNTER — Encounter: Payer: Self-pay | Admitting: *Deleted

## 2019-06-01 ENCOUNTER — Encounter: Payer: Self-pay | Admitting: Neurology

## 2019-06-01 ENCOUNTER — Other Ambulatory Visit: Payer: Self-pay

## 2019-06-01 VITALS — BP 140/90 | Temp 98.0°F | Ht 65.0 in | Wt 180.0 lb

## 2019-06-01 DIAGNOSIS — G43829 Menstrual migraine, not intractable, without status migrainosus: Secondary | ICD-10-CM

## 2019-06-01 MED ORDER — AIMOVIG 70 MG/ML ~~LOC~~ SOAJ: 70.0000 mg | SUBCUTANEOUS | 11 refills | Status: DC

## 2019-06-01 MED ORDER — UBRELVY 100 MG PO TABS: 1.0000 | ORAL_TABLET | ORAL | 3 refills | Status: AC | PRN

## 2019-06-01 NOTE — Progress Notes (Addendum)
Lauren Gonzalez (Key: [id number]) Rx #: [id number] Roselyn Meier 100MG  tablets   Form Blue Cross [place] Commercial Electronic Request Form (CB) Created 2 days ago Sent to Plan 2 days ago Plan Response 2 days ago Submit Clinical Questions 2 days ago Determination Favorable 3 hours ago Message from Plan Your request has been approved Effective from 06/01/2019 through 08/23/2019. Your information has been submitted to [place]. Blue Cross [place] will review the request and fax you a determination directly, typically within 3 business days of your submission once all necessary information is received. If [employer] has not responded in 3 business days or if you have any questions about your submission, contact [place]  at [phone].

## 2019-06-01 NOTE — Patient Instructions (Signed)
1.  For preventative management, start Aimovig 70mg  monthly 2.  For abortive therapy, take Ubrelvy 100mg  when you get a migraine.  May repeat dose once after 2 hours if needed (maximum 2 tablets in 24 hours).  Hopefully, it will be effective and not cause drowsiness.  Otherwise, you may try Frova as well.  STOP TYLENOL #3. 3.  Limit use of pain relievers to no more than 2 days out of week to prevent risk of rebound or medication-overuse headache. 4.  Keep headache diary 5.  Exercise, hydration, caffeine cessation, sleep hygiene, monitor for and avoid triggers 6.  Consider:  magnesium citrate 400mg  daily, riboflavin 400mg  daily, and coenzyme Q10 100mg  three times daily 7. Always keep in mind that currently taking a hormone or birth control may be a possible trigger or aggravating factor for migraine. 8. Follow up 4 months.

## 2019-06-01 NOTE — Progress Notes (Addendum)
Lauren Gonzalez (Lauren Gonzalez) Rx #: [id number] Aimovig 70MG /ML auto-injectors   Form Blue Cross [place] Commercial Electronic Request Form (CB) Created 2 days ago Sent to Plan 2 days ago Plan Response 2 days ago Submit Clinical Questions 2 days ago Determination Favorable 2 hours ago Message from Plan Effective from 06/01/2019 through 08/29/2019.   Your information has been submitted to [place]. Blue Cross [place] will review the request and fax you a determination directly, typically within 3 business days of your submission once all necessary information is received. If [employer] [place] has not responded in 3 business days or if you have any questions about your submission, contact [place] at [phone].

## 2019-07-06 ENCOUNTER — Telehealth: Payer: Self-pay | Admitting: Neurology

## 2019-07-06 ENCOUNTER — Telehealth: Payer: Self-pay | Admitting: Internal Medicine

## 2019-07-06 NOTE — Telephone Encounter (Signed)
That would be okay

## 2019-07-06 NOTE — Telephone Encounter (Signed)
Dr. Tomi Likens -   Called patient back and she started Aimovig for the first time beginning of the month. She gave in the back of her arm in SQ tissue and it bruised - better but still slight bruise there. She also wanted to make you aware of constipation and hypertension this month after starting Aimovig. She states constipation "isnt bad" but she is just not going as much as typically. Regarding BP, she typically is between 115--135/60-70. She has taken several times over the past month (is a paramedic and takes at work) and it was as high as 174-081 systolic. She states the 448 systolic was when she was really stressed at work. Most recent BP was 140/78 last Thursday.   Stated she was recently changed from night shift to day shift and this has caused a lot of stress and thrown off her routine.  She isn't sure if the Aimovig is helping and wondering with the side effects if she should stop taking it.  Also started Ubrelvy at the same time this month and has taken it several times. States it kind of helps "like a bandaid" but doesn't totally get rid of the migraine. She has never repeated the dose after two hours as she was nervous to at work. I suggested she might try when she is off at home to try the second tablet as directed if necessary and see if that helps more.  Informed patient will let MD know above info and call her back.

## 2019-07-06 NOTE — Telephone Encounter (Signed)
Called Lauren Gonzalez back and told her Dr. Tomi Likens stated it is fine for her to use the Aimovig again next month. She will continue to check her BP and let us know how she is doing. If she does want to change to another preventative such as Emgality in the future she will let us know.

## 2019-07-06 NOTE — Telephone Encounter (Addendum)
Patient called to report to the nurse that her recent injection of Aimovig caused bruising at the injection site and she's also having constipation, and high blood pressure she thinks may be associated with it. She said the generic Lenoria Chime is working better for her.

## 2019-07-06 NOTE — Telephone Encounter (Signed)
Ok with me 

## 2019-07-06 NOTE — Telephone Encounter (Signed)
Patient is calling to let Dr. Jenny Reichmann know that her miagrines increasing. Her job switched her from night shift to day shift. And this is causing increased triggers of headaches for the patient.   Patient is requesting change in her FMLA paperwork. She requesting 6 days instead 4 days As needed. Please advise [phone]  If Dr. Jenny Reichmann agree she can drop off the paperwork.

## 2019-07-06 NOTE — Telephone Encounter (Signed)
Called patient back and told her MD stated if she wanted could switch from [place] to [facility] another monthly injection that she might tolerate better.  Patient stated she would consider it but wanted to know if okay with MD if she tried Aimovig for one more month to see if it might work better. She stated she thinks some of her BP issues were due to stress at work.   Dr. Tomi Likens- is it okay if she does one more month of the [place]?

## 2019-07-06 NOTE — Telephone Encounter (Signed)
We can switch from Aimovig to [place], another monthly injection.  She may tolerate that better.

## 2019-07-08 NOTE — Telephone Encounter (Signed)
LVM for patient to inform her, Dr.John is okay with this. To please fax or drop off paperwork.

## 2019-07-08 NOTE — Telephone Encounter (Signed)
Forms have been completed & Placed in providers box to review and sign.  

## 2019-07-09 NOTE — Telephone Encounter (Signed)
Forms have been signed, Faxed to [employer] [phone], Copy sent to scan.   Patient informed and original mailed to patient for her records.

## 2019-07-28 ENCOUNTER — Telehealth: Payer: Self-pay | Admitting: Internal Medicine

## 2019-07-28 DIAGNOSIS — G4700 Insomnia, unspecified: Secondary | ICD-10-CM

## 2019-07-28 MED ORDER — ZOLPIDEM TARTRATE ER 12.5 MG PO TBCR: EXTENDED_RELEASE_TABLET | ORAL | 1 refills | Status: DC

## 2019-07-28 NOTE — Telephone Encounter (Signed)
Done ex

## 2019-08-03 ENCOUNTER — Other Ambulatory Visit: Payer: Self-pay

## 2019-08-03 DIAGNOSIS — Z20822 Contact with and (suspected) exposure to covid-19: Secondary | ICD-10-CM

## 2019-08-04 ENCOUNTER — Telehealth: Payer: Self-pay

## 2019-08-04 NOTE — Telephone Encounter (Signed)
[place] for note for oct 26, 27,28 - ok for shirron, thanks

## 2019-08-04 NOTE — Telephone Encounter (Signed)
Work note has been faxed to number provided below.

## 2019-08-04 NOTE — Telephone Encounter (Signed)
Copied from [place] [id number]. Topic: General - Other >> Aug 04, 2019  9:29 AM Sheran Luz wrote: Patient called to inquire if Dr. Jenny Reichmann would be willing to write work note for patient stating that she has been tested for covid-19 and is awaiting result. Patient states she was feeling unwell since Sunday with a cough, chills and a headache so she went to testing site.    Fax:  Malachy Mood  [phone]

## 2019-08-04 NOTE — Telephone Encounter (Addendum)
Pt stated that she has been out of work yesterday and today. Still waiting for results.

## 2019-08-04 NOTE — Telephone Encounter (Signed)
Ok but would to know exact dates to be off work

## 2019-08-05 LAB — NOVEL CORONAVIRUS, NAA: SARS-CoV-2, NAA: DETECTED — AB

## 2019-08-05 NOTE — Telephone Encounter (Signed)
Ok for work note start oct 26 and return to work in 15 days - to [employer], thanks

## 2019-08-05 NOTE — Telephone Encounter (Signed)
Letter has been faxed to number provided below. 

## 2019-08-05 NOTE — Telephone Encounter (Signed)
° °  Pt need a note sent to her  HR dept stating she need to be quarantine for 14 days because she tested postive for COVID     [place]

## 2019-08-12 ENCOUNTER — Encounter: Payer: Self-pay | Admitting: *Deleted

## 2019-08-12 NOTE — Progress Notes (Signed)
Lauren Gonzalez (Key: [id number]) Roselyn Meier 100MG  tablets   Form Blue Building control surveyor Form (CB) Created 1 day ago Sent to Plan 21 hours ago Plan Response 21 hours ago Submit Clinical Questions 21 hours ago Determination Favorable 21 hours ago Message from Plan Effective from 08/11/2019 through 08/09/2020.   Letter sent to scan into her chart

## 2019-08-17 ENCOUNTER — Encounter: Payer: Self-pay | Admitting: *Deleted

## 2019-08-17 NOTE — Progress Notes (Addendum)
Mayara Monte (Key: [id number]) Aimovig 70MG /ML auto-injectors   Form Blue Building control surveyor Form (CB) Created 1 day ago Sent to Plan 1 day ago Plan Response 1 day ago Submit Clinical Questions 1 day ago Determination Favorable 18 hours ago Message from Plan Effective from 08/17/2019 through 08/15/2020.

## 2019-09-24 ENCOUNTER — Telehealth: Payer: BC Managed Care – PPO | Admitting: Neurology

## 2019-10-22 ENCOUNTER — Encounter: Payer: Self-pay | Admitting: Neurology

## 2019-10-22 NOTE — Progress Notes (Addendum)
Virtual Visit via Video Note The purpose of this virtual visit is to provide medical care while limiting exposure to the novel coronavirus.    Consent was obtained for video visit:  Yes.   Answered questions that patient had about telehealth interaction:  Yes.   I discussed the limitations, risks, security and privacy concerns of performing an evaluation and management service by telemedicine. I also discussed with the patient that there may be a patient responsible charge related to this service. The patient expressed understanding and agreed to proceed.  Pt location: Home Physician Location: office Name of referring provider:  Corwin Levins, MD I connected with Tharon Aquas at patients initiation/request on 10/23/2019 at  1:50 PM EST by video enabled telemedicine application and verified that I am speaking with the correct person using two identifiers. Pt MRN:  [id number] Pt DOB:  February 05, 1986 Video Participants:  Tharon Aquas   History of Present Illness:  Jonnell Hentges is a 34 year old female with history of DVT who follows up for migraines.  UPDATE: Started Aimovig but discontinued it after 2 months due to elevated blood pressure (150s to 170s systolic, usually runs 120-130 systolic).    Intensity:  severe Duration:  3 to 4 hours, sometime lasts all day if during menses. Frequency:  Mild-moderate migraine just before her period.  Severe during her period, may have one other one outside period.  (total 4 to 5 days a month during week of her period).  However, she also has 10 more moderate headache days a month  Rescue therapy: ibuprofen or Ubrelvy.  Sometimes Frova.  Current NSAIDS:  ibuprofen Current analgesics:  none Current triptans:  Frova 2.5mg  Current ergotamine:  none Current anti-emetic:  Phenergan PR Current muscle relaxants:  Cyclobenzaprine 5mg  Current anti-anxiolytic:  Clonazepam 1mg  twice daily PRN Current sleep aide:  Ambien Current Antihypertensive  medications:  none Current Antidepressant medications:  none Current Anticonvulsant medications:  none Current anti-CGRP:  Aimovig 70mg ; Ubrelvy Current Vitamins/Herbal/Supplements:  none Current Antihistamines/Decongestants:  none Other therapy:  none Hormone/birth control:  Norlyda (progesterone)  Caffeine:  1 cup coffee a week.  Energy drink infrequently.  Occasional Pepsi Diet:  At least 64 oz water.  Occasional soda (Sprite, orange soda) Exercise:  Not routine Depression and Anxiety.  Has Bipolar disorder.  Currently stable. Other pain:  no Sleep hygiene:  Good when she is not working.  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  HISTORY: Onset:  Since highschool.  Worse over past year, worse related to menstrual cycle. Location:  Starts  Unilateral (either side) and the becomes bilatera, frontal, face, temples, radiates to back of head Quality:  Starts as pressure and becomes pounding/throbbing Initial intensity:  Severe.  She denies new headache, thunderclap headache or severe headache that wakes her from sleep. Aura:  none Premonitory Phase:  Feels slightly "off"  Postdrome:  Tired Associated symptoms:  Nausea, vomiting, photophobia, phonophobia, osmophobia, hair and teeth hurt.  She denies associated unilateral numbness or weakness. Initial duration:  All day (sometimes in to next day) Initial frequency:  Mild-moderate migraine just before her period.  Severe during her period, may have one other one outside period.  (total 4 days a month) Initial frequency of abortive medication: 4 days a month Triggers:  Menses, weather changes, certain smells, emotional stress, caffeine-withdrawal, sleep deprivation) Relieving factors:  Coolness, dark place Activity: aggravates  MRI of brain with and without contrat from 11/09/04 personally reviewed and  was normal.   Past NSAIDS:  Cambia, naproxen Past analgesics:  Excedrin, BC powder; Tylenol #3 Past abortive  triptans:  Sumatriptan 100mg , Zomig 5mg  NS (effective but felt in a daze all day) Past abortive ergotamine:  none Past muscle relaxants:  Robaxin Past anti-emetic:  promethazine Past antihypertensive medications:  none Past antidepressant/antipsychotic medications:  Sertraline, Vyanse Past anticonvulsant medications:  topiramate 50mg  twice daily (paresthesias) Past anti-CGRP:  Aimovig (caused elevated blood pressure) Past vitamins/Herbal/Supplements:  Magnesium, CoQ10 Past antihistamines/decongestants:  Zyrtec Other past therapies:  Changing birth control from Nuvaring to progesterone, trigger point injections/nerve blocks   Family history of headache:  Dad (migraines, resolved)  Past Medical History: Past Medical History:  Diagnosis Date  . ADD (attention deficit disorder) 12/29/2013  . Allergic rhinitis, cause unspecified   . DISORDER, BIPOLAR NEC   . DVT (deep venous thrombosis) (HCC)    in leg July 2020 (vaginal ring discontinued after blood clot/started on Xarelto - off now)  . Insomnia, unspecified   . MIGRAINE HEADACHE     Medications: Outpatient Encounter Medications as of 10/23/2019  Medication Sig Note  . acetaminophen-codeine (TYLENOL #3) 300-30 MG tablet Take 1 tablet by mouth every 6 (six) hours as needed for moderate pain.   12/31/2013 albuterol (PROVENTIL HFA;VENTOLIN HFA) 108 (90 Base) MCG/ACT inhaler Inhale 2 puffs into the lungs every 6 (six) hours as needed for wheezing or shortness of breath.   . clonazePAM (KLONOPIN) 1 MG tablet Take 1 tablet (1 mg total) by mouth 2 (two) times daily as needed for anxiety.   . cyclobenzaprine (FLEXERIL) 5 MG tablet Take 1 tablet (5 mg total) by mouth 3 (three) times daily as needed for muscle spasms.   August 2020 (AIMOVIG) 70 MG/ML SOAJ Inject 70 mg into the skin every 30 (thirty) days.   10/25/2019 etonogestrel-ethinyl estradiol (NUVARING) 0.12-0.015 MG/24HR vaginal ring Place 1 each vaginally every 28 (twenty-eight) days. Insert vaginally  and leave in place for 3 consecutive weeks, then remove for 1 week. 05/29/2019: Took off this July 2002 due to DVT in leg   . frovatriptan (FROVA) 2.5 MG tablet Take 1 tablet (2.5 mg total) by mouth as needed for migraine. If recurs, may repeat after 2 hours. Max of 3 tabs in 24 hours. 05/29/2019: Has not started taking yet  . ibuprofen (ADVIL,MOTRIN) 800 MG tablet Take 1 tablet (800 mg total) by mouth every 6 (six) hours as needed.   . NORLYDA 0.35 MG tablet TK 1 T PO QD   . Ubrogepant (UBRELVY) 100 MG TABS Take 1 tablet by mouth as needed (May repeat 1 tablet after 2 hours if needed.  Maximum 2 tablets in 24 hours).   . zolpidem (AMBIEN CR) 12.5 MG CR tablet TAKE 1 TABLET(12.5 MG) BY MOUTH AT BEDTIME AS NEEDED FOR SLEEP    No facility-administered encounter medications on file as of 10/23/2019.    Allergies: Allergies  Allergen Reactions  . Influenza Vaccines Rash  . Levaquin [Levofloxacin] Hives and Rash    Family History: Family History  Problem Relation Age of Onset  . Colon polyps Father   . Depression Other   . Diabetes Other   . Stroke Other   . Heart disease Other   . Cancer Other        lung and ovarian cancer  . Arthritis Other     Social History: Social History   Socioeconomic History  . Marital status: Single    Spouse name: Not on file  . Number of  children: Not on file  . Years of education: Not on file  . Highest education level: Not on file  Occupational History  . 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 Smoking status: Former Research scientist (life sciences)  . Smokeless tobacco: Never Used  Substance and Sexual Activity  . Alcohol use: Yes    Comment:  2 bottles of wine/week  (not every week)  . Drug use: Yes    Types: Marijuana    Comment: no marijuana  anymore - uses CBD oil via  vaping   . Sexual activity: Not on file  Other Topics Concern  . Not on file  Social History Narrative   LIves in one level home with  boyfriend. R handed. 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 Caffeine - drinks 16 oz soda a day but some days it doesn't have caffeine as is sprite/fanta. No regular exercise program.   Social Determinants of Health   Financial Resource Strain:   . Difficulty of Paying Living Expenses: Not on file  Food Insecurity:   . Worried About Charity fundraiser in the Last Year: Not on file  . Ran Out of Food in the Last Year: Not on file  Transportation Needs:   . Lack of Transportation (Medical): Not on file  . Lack of Transportation (Non-Medical): Not on file  Physical Activity:   . Days of Exercise per Week: Not on file  . Minutes of Exercise per Session: Not on file  Stress:   . Feeling of Stress : Not on file  Social Connections:   . Frequency of Communication with Friends and Family: Not on file  . Frequency of Social Gatherings with Friends and Family: Not on file  . Attends Religious Services: Not on file  . Active Member of Clubs or Organizations: Not on file  . Attends Archivist Meetings: Not on file  . Marital Status: Not on file  Intimate Partner Violence:   . Fear of Current or Ex-Partner: Not on file  . Emotionally Abused: Not on file  . Physically Abused: Not on file  . Sexually Abused: Not on file    Observations/Objective:   Height 5\' 5"  (1.651 m), weight 175 lb (79.4 kg). No acute distress.  Alert and oriented.  Speech fluent and not dysarthric.  Language intact.  Eyes orthophoric on primary gaze.  Face symmetric.  Assessment and Plan:   Menstrual migraines/chronic migraine without aura, without status migrainosus, not intractable.  1.  For preventative management, we will use Frova as a perimenstrual prophylaxis:  Frova 5mg  twice daily 2 days prior to first day of menstrual period, then 2.5mg  twice daily for next 5 days (total 6 days) 2.  For abortive therapy, Ubrelvy 100mg  3.  Limit use of pain relievers to no more than 2 days out of week to prevent risk of  rebound or medication-overuse headache. 4.  Keep headache diary 5.  Exercise, hydration, caffeine cessation, sleep hygiene, monitor for and avoid triggers 6.  Follow up 4 months   Follow Up Instructions:    -I discussed the assessment and treatment plan with the patient. The patient was provided an opportunity to ask questions and all were answered. The patient agreed with the plan and demonstrated an understanding of the instructions.   The patient was advised to call back or seek an in-person evaluation if the symptoms worsen or if the condition fails to improve as anticipated.  Cira Servant, DO   12/21/2019 ADDENDUM: Frova perimenstrual prophylaxis is effective but she has irregular periods, often occurring twice a month.  She also has other more moderate headaches, for a total of at least 15 headache days a month, occurring more than 3 consecutive months.  She has failed antiepileptic medication (topiramate caused paresthesias) and antidepressant (sertraline) as well as side effect to Aimovig (it caused elevated blood pressure).  Given the serious nature of side effect to Aimovig, I would rather not try a different CGRP inhibitor.  Therefore, I would like to pursue Botox for chronic migraine. Shon Millet, DO

## 2019-10-23 ENCOUNTER — Other Ambulatory Visit: Payer: Self-pay

## 2019-10-23 ENCOUNTER — Telehealth (INDEPENDENT_AMBULATORY_CARE_PROVIDER_SITE_OTHER): Payer: BC Managed Care – PPO | Admitting: Neurology

## 2019-10-23 VITALS — Ht 65.0 in | Wt 175.0 lb

## 2019-10-23 DIAGNOSIS — G43829 Menstrual migraine, not intractable, without status migrainosus: Secondary | ICD-10-CM

## 2019-10-23 MED ORDER — FROVATRIPTAN SUCCINATE 2.5 MG PO TABS: ORAL_TABLET | ORAL | 5 refills | Status: DC

## 2019-10-28 NOTE — Progress Notes (Signed)
KEY [id number] Binz DOB [birth year]  RX# [id number]  [facility]  PA approved 10/28/19 until 10/26/22

## 2019-12-15 ENCOUNTER — Telehealth: Payer: Self-pay | Admitting: Neurology

## 2019-12-15 NOTE — Telephone Encounter (Signed)
Patient called in regarding her ADA Paper work for Migraines and needing to see if Dr. Everlena Cooper can fill it out for her HR.  Thank you

## 2019-12-15 NOTE — Telephone Encounter (Signed)
Yes, I can fill it out

## 2019-12-15 NOTE — Telephone Encounter (Signed)
Pt called

## 2019-12-15 NOTE — Telephone Encounter (Signed)
Please advise 

## 2019-12-17 NOTE — Telephone Encounter (Signed)
Not completed at this time. Patient notified

## 2019-12-17 NOTE — Telephone Encounter (Signed)
Left message with the after hour service on 12-17-19   Returning a call about paperwork

## 2019-12-22 ENCOUNTER — Encounter: Payer: Self-pay | Admitting: Neurology

## 2019-12-22 NOTE — Progress Notes (Addendum)
Submitted Benefit Verification through [facility] for patient's Botox.  PA is needed. Submitted PA through the Cover My Meds. Awaiting response.   Shantea Gallus Key: [id number] help? Call us at [phone] Status Sent to [facility] Botox 200UNIT solution Form Blue Cross [place] Commercial Electronic Request Form (CB)

## 2019-12-29 NOTE — Progress Notes (Signed)
Received approval valid 12/25/2019-06/22/2020 Authorized 4 visits Will require clinical update to extend services Fax was sent to scan into her chart. Lauren Gonzalez was notified to make appointment.

## 2019-12-31 ENCOUNTER — Other Ambulatory Visit: Payer: Self-pay | Admitting: Internal Medicine

## 2019-12-31 DIAGNOSIS — F419 Anxiety disorder, unspecified: Secondary | ICD-10-CM

## 2019-12-31 DIAGNOSIS — M542 Cervicalgia: Secondary | ICD-10-CM

## 2019-12-31 NOTE — Telephone Encounter (Signed)
Done erx 

## 2020-01-19 ENCOUNTER — Telehealth: Payer: Self-pay | Admitting: Neurology

## 2020-01-19 NOTE — Telephone Encounter (Signed)
Patient called and gave her new insurance information. She'd like to know if her next Botox will be covered.

## 2020-01-19 NOTE — Telephone Encounter (Signed)
LMOM her insurance is covering Botox for 03/18/2020 it approved 4 visits and this is the first of 4. I asked her to call if insurance has changed again or if it is still the same she had on file during March 2021 which is when the PA was initiated.

## 2020-01-19 NOTE — Telephone Encounter (Signed)
Called back to confirm her insurance changed. I will start new prior authorization process.

## 2020-02-23 NOTE — Progress Notes (Signed)
Due to the COVID-19 crisis, this virtual check-in visit was done via telephone from my office and it was initiated and consent given by this patient and or family.  Due to inability to successfully connect for video visit, appointment was switched to a telephone visit.  Telephone (Audio) Visit The purpose of this telephone visit is to provide medical care while limiting exposure to the novel coronavirus.    Consent was obtained for telephone visit and initiated by pt/family:  Yes.   Answered questions that patient had about telehealth interaction:  Yes.   I discussed the limitations, risks, security and privacy concerns of performing an evaluation and management service by telephone. I also discussed with the patient that there may be a patient responsible charge related to this service. The patient expressed understanding and agreed to proceed.  Pt location: Home Physician Location: office Name of referring provider:  Corwin Levins, MD I connected with .Lauren Gonzalez at patients initiation/request on 02/24/2020 at 10:50 AM EDT by telephone and verified that I am speaking with the correct person using two identifiers.  Pt MRN:  [id number] Pt DOB:  July 12, 1986  Assessment/Plan:  Chronic migraine without aura, without status migrainosus, not intractable  1.  For preventative management, starting Botox next month 2.  For abortive therapy, Ubrelvy 3.  Limit use of pain relievers to no more than 2 days out of week to prevent risk of rebound or medication-overuse headache. 4.  Keep headache diary 5.  Exercise, hydration, caffeine cessation, sleep hygiene, monitor for and avoid triggers   Need for in person visit now:  No.  Subjective:   Lauren Gonzalez is a 34 year old female with history of DVT who follows up for migraines.  UPDATE: Started Frova as perimenstrual prophylaxis.  Helpful but expensive.  Also, headaches are now not clustered as much around her cycle. Plan is to start Botox  next month. She had COVID-19 in October.  She lost her sense of smell and taste.  Recently, smell has returned but things smell different and more caustic, which easily triggers migraines.  For example, perfumes now smell like toxic fumes.  She has 6 severe migraines over the past 30 days, 2 back to back during her cycle.  Usually responds to [place] in a few hours.  She has total of 15 headache days a month counting the more dull to moderate headaches.  Rescue therapy: ibuprofen or Ubrelvy.  Sometimes Frova. Current NSAIDS:ibuprofen Current analgesics:none Current triptans:Frova 2.5mg  Current ergotamine:none Current anti-emetic:Phenergan PR Current muscle relaxants:Cyclobenzaprine5mg  Current anti-anxiolytic:Clonazepam 1mg  twice daily PRN Current sleep aide:Ambien Current Antihypertensive medications:none Current Antidepressant medications:none Current Anticonvulsant medications:none Current anti-CGRP:Ubrelvy Current Vitamins/Herbal/Supplements:none Current Antihistamines/Decongestants:none Other therapy:none Hormone/birth control:Norlyda (progesterone)  Caffeine:1 cup coffee a week. Energy drink infrequently. Occasional Pepsi Diet:At least 64 oz water. Occasional soda (Sprite, orange soda) Exercise:Not routine DepressionandAnxiety. Has Bipolar disorder. Currently stable. Other pain:no Sleep hygiene:Good when she is not working. 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  HISTORY: Onset:Since highschool. Worse over past year, worse related to menstrual cycle. Location:Starts Unilateral (either side) and the becomes bilatera, frontal, face, temples, radiates to back of head Quality:Starts as pressure and becomes pounding/throbbing Initial intensity:Severe. Shedenies new headache, thunderclap headache or severe headache that wakes herfrom sleep. Aura:none Premonitory Phase:Feels slightly  "off" Postdrome:Tired Associated symptoms:Nausea,vomiting, photophobia, phonophobia, osmophobia, hair and teeth hurt.Shedenies associated unilateral numbness or weakness. Initial duration:All day (sometimes in to next day) Initial frequency:Mild-moderate migraine just before her period.  Severe during her period, may have one other one outside period. (total 4 days a month) Initial frequency of abortive medication:4 days a month Triggers:Menses, weather changes, certain smells, emotional stress, caffeine-withdrawal, sleep deprivation) Relieving factors:Coolness, dark place Activity:aggravates  MRI of brain with and without contrat from 11/09/04 personally reviewed and was normal.   Past NSAIDS:Cambia, naproxen Past analgesics:Excedrin, BC powder; Tylenol #3 Past abortive triptans:Sumatriptan 100mg , Zomig 5mg  NS(effective but felt in a daze all day) Past abortive ergotamine:none Past muscle relaxants:Robaxin Past anti-emetic:promethazine Past antihypertensive medications:none Past antidepressant/antipsychoticmedications: Sertraline, Vyanse Past anticonvulsant medications:topiramate 50mg  twice daily(paresthesias) Past anti-CGRP:Aimovig (caused elevated blood pressure) Past vitamins/Herbal/Supplements:Magnesium, CoQ10 Past antihistamines/decongestants:Zyrtec Other past therapies:Changing birth control from [place] to progesterone, trigger point injections/nerve blocks   Family history of headache:Dad (migraines, resolved)   Objective:   Vitals:   02/24/20 1003  Weight: 180 lb (81.6 kg)  Height: 5\' 8"  (1.727 m)     Follow Up Instructions:      -I discussed the assessment and treatment plan with the patient. The patient was provided an opportunity to ask questions and all were answered. The patient agreed with the plan and demonstrated an understanding of the instructions.   The patient was advised to call back or seek an  in-person evaluation if the symptoms worsen or if the condition fails to improve as anticipated.    Total Time spent in visit with the patient was:  16 minutes  Dudley Major, DO

## 2020-02-24 ENCOUNTER — Encounter: Payer: Self-pay | Admitting: Neurology

## 2020-02-24 ENCOUNTER — Telehealth (INDEPENDENT_AMBULATORY_CARE_PROVIDER_SITE_OTHER): Payer: BC Managed Care – PPO | Admitting: Neurology

## 2020-02-24 ENCOUNTER — Other Ambulatory Visit: Payer: Self-pay

## 2020-02-24 VITALS — Ht 68.0 in | Wt 180.0 lb

## 2020-02-24 DIAGNOSIS — G43709 Chronic migraine without aura, not intractable, without status migrainosus: Secondary | ICD-10-CM | POA: Diagnosis not present

## 2020-02-26 ENCOUNTER — Telehealth: Payer: Self-pay | Admitting: Neurology

## 2020-02-26 NOTE — Telephone Encounter (Signed)
Patient called in with a question about her next Botox injection coming up.

## 2020-03-04 ENCOUNTER — Encounter: Payer: Self-pay | Admitting: *Deleted

## 2020-03-04 NOTE — Progress Notes (Signed)
Submitted via [facility] after phone call from patient stating she had new insurance. Benefit Verification [id number] Submitted! Please allow 24-48 hours for BV results.

## 2020-03-09 NOTE — Progress Notes (Signed)
Lauren Gonzalez (Key: [id number])  Your information has been submitted to [facility]. [employer] Cross [place] will review the request and notify you of the determination decision directly, typically within 72 hours of receiving all information.  You will also receive your request decision electronically. To check for an update later, open this request again from your dashboard.  If [employer] has not responded within the specified timeframe or if you have any questions about your PA submission, contact Blue Cross [place] directly at [phone].

## 2020-03-11 NOTE — Progress Notes (Addendum)
Received fax from [facility] needing additional info; faxed back needed info. Awaiting determination.   Per BCBS patient is approved for Botox effective 03/09/20-03/08/21. 4 visit approval.

## 2020-03-18 ENCOUNTER — Ambulatory Visit (INDEPENDENT_AMBULATORY_CARE_PROVIDER_SITE_OTHER): Payer: BC Managed Care – PPO | Admitting: Neurology

## 2020-03-18 ENCOUNTER — Other Ambulatory Visit: Payer: Self-pay

## 2020-03-18 DIAGNOSIS — G43709 Chronic migraine without aura, not intractable, without status migrainosus: Secondary | ICD-10-CM

## 2020-03-18 MED ORDER — ONABOTULINUMTOXINA 100 UNITS IJ SOLR
175.0000 [IU] | Freq: Once | INTRAMUSCULAR | Status: AC
  Administered 2020-03-18: 155 [IU] via INTRAMUSCULAR

## 2020-03-18 MED ORDER — ONABOTULINUMTOXINA 100 UNITS IJ SOLR
200.0000 [IU] | Freq: Once | INTRAMUSCULAR | Status: AC
  Administered 2020-03-18: 155 [IU] via INTRAMUSCULAR

## 2020-03-18 NOTE — Progress Notes (Signed)
[facility]  ° °Procedure Note Botox ° °Attending: Dr.   ° °Preoperative Diagnosis(es): Chronic migraine ° °Consent obtained from: Patient °Benefits discussed included, but were not limited to decreased muscle tightness, increased joint range of motion, and decreased pain.  Risk discussed included, but were not limited pain and discomfort, bleeding, bruising, excessive weakness, venous thrombosis, muscle atrophy and dysphagia.  Anticipated outcomes of the procedure as well as he risks and benefits of the alternatives to the procedure, and the roles and tasks of the personnel to be involved, were discussed with the patient, and the patient consents to the procedure and agrees to proceed. A copy of the patient medication guide was given to the patient which explains the blackbox warning. ° °Patients identity and treatment sites confirmed:  Yes. ° °Details of Procedure: °Skin was cleaned with alcohol. Prior to injection, the needle plunger was aspirated to make sure the needle was not within a blood vessel.  There was no blood retrieved on aspiration.   ° °Following is a summary of the muscles injected  And the amount of Botulinum toxin used: ° °Dilution °200 units of Botox was reconstituted with 4 ml of preservative free normal saline. °Time of reconstitution: At the time of the office visit (<30 minutes prior to injection)  ° °Injections  °155 total units of Botox was injected with a 30 gauge needle. ° °Injection Sites: °L occipitalis: 15 units- 3 sites  °R occiptalis: 15 units- 3 sites ° °L upper trapezius: 15 units- 3 sites °R upper trapezius: 15 units- 3 sits          °L paraspinal: 10 units- 2 sites °R paraspinal: 10 units- 2 sites ° °Face °L frontalis(2 injection sites):10 units   °R frontalis(2 injection sites):10 units         °L corrugator: 5 units   °R corrugator: 5 units           °Procerus: 5 units   °L temporalis: 20 units °R temporalis: 20 units  ° °Agent:  °200 units of botulinum Type A  (Onobotulinum Toxin type A) was reconstituted with 4 ml of preservative free normal saline.  °Time of reconstitution: At the time of the office visit (<30 minutes prior to injection)  ° ° ° Total injected (Units): 155 ° Total wasted (Units): 20 ° °Patient tolerated procedure well without complications.   °Reinjection is anticipated in 3 months. ° ° ° °

## 2020-06-09 DIAGNOSIS — Z7189 Other specified counseling: Secondary | ICD-10-CM | POA: Diagnosis not present

## 2020-06-09 DIAGNOSIS — Z6832 Body mass index (BMI) 32.0-32.9, adult: Secondary | ICD-10-CM | POA: Diagnosis not present

## 2020-06-09 DIAGNOSIS — R3121 Asymptomatic microscopic hematuria: Secondary | ICD-10-CM | POA: Diagnosis not present

## 2020-06-09 DIAGNOSIS — Z01419 Encounter for gynecological examination (general) (routine) without abnormal findings: Secondary | ICD-10-CM | POA: Diagnosis not present

## 2020-06-09 DIAGNOSIS — Z130 Encounter for screening for diseases of the blood and blood-forming organs and certain disorders involving the immune mechanism: Secondary | ICD-10-CM | POA: Diagnosis not present

## 2020-06-09 DIAGNOSIS — Z3041 Encounter for surveillance of contraceptive pills: Secondary | ICD-10-CM | POA: Diagnosis not present

## 2020-06-17 ENCOUNTER — Other Ambulatory Visit: Payer: Self-pay

## 2020-06-17 ENCOUNTER — Ambulatory Visit (INDEPENDENT_AMBULATORY_CARE_PROVIDER_SITE_OTHER): Payer: BC Managed Care – PPO | Admitting: Neurology

## 2020-06-17 DIAGNOSIS — G43709 Chronic migraine without aura, not intractable, without status migrainosus: Secondary | ICD-10-CM | POA: Diagnosis not present

## 2020-06-17 MED ORDER — ONABOTULINUMTOXINA 100 UNITS IJ SOLR
160.0000 [IU] | Freq: Once | INTRAMUSCULAR | Status: AC
  Administered 2020-06-17: 160 [IU] via INTRAMUSCULAR

## 2020-06-17 NOTE — Progress Notes (Signed)
[facility]  ° °Procedure Note Botox ° °Attending: Dr.   ° °Preoperative Diagnosis(es): Chronic migraine ° °Consent obtained from: The patient °Benefits discussed included, but were not limited to decreased muscle tightness, increased joint range of motion, and decreased pain.  Risk discussed included, but were not limited pain and discomfort, bleeding, bruising, excessive weakness, venous thrombosis, muscle atrophy and dysphagia.  Anticipated outcomes of the procedure as well as he risks and benefits of the alternatives to the procedure, and the roles and tasks of the personnel to be involved, were discussed with the patient, and the patient consents to the procedure and agrees to proceed. A copy of the patient medication guide was given to the patient which explains the blackbox warning. ° °Patients identity and treatment sites confirmed Yes.  . ° °Details of Procedure: °Skin was cleaned with alcohol. Prior to injection, the needle plunger was aspirated to make sure the needle was not within a blood vessel.  There was no blood retrieved on aspiration.   ° °Following is a summary of the muscles injected  And the amount of Botulinum toxin used: ° °Dilution °200 units of Botox was reconstituted with 4 ml of preservative free normal saline. °Time of reconstitution: At the time of the office visit (<30 minutes prior to injection)  ° °Injections  °155 total units of Botox was injected with a 30 gauge needle. ° °Injection Sites: °L occipitalis: 15 units- 3 sites  °R occiptalis: 15 units- 3 sites ° °L upper trapezius: 15 units- 3 sites °R upper trapezius: 15 units- 3 sits          °L paraspinal: 10 units- 2 sites °R paraspinal: 10 units- 2 sites ° °Face °L frontalis(2 injection sites):10 units   °R frontalis(2 injection sites):10 units         °L corrugator: 5 units   °R corrugator: 5 units           °Procerus: 5 units   °L temporalis: 20 units °R temporalis: 20 units  ° °Agent:  °200 units of botulinum Type  A (Onobotulinum Toxin type A) was reconstituted with 4 ml of preservative free normal saline.  °Time of reconstitution: At the time of the office visit (<30 minutes prior to injection)  ° ° ° Total injected (Units):  155 ° Total wasted (Units):  5 ° °Patient tolerated procedure well without complications.   °Reinjection is anticipated in 3 months. ° ° °

## 2020-08-01 ENCOUNTER — Encounter: Payer: Self-pay | Admitting: Neurology

## 2020-08-01 NOTE — Progress Notes (Addendum)
Calayah Parlin (Key: [id number]) Bernita Raisin 100MG  tablets   Form Blue Form (CB) Created 5 days ago Sent to Plan 23 hours ago Plan Response 23 hours ago Submit Clinical Questions 23 hours ago Determination Favorable 15 hours ago Message from Plan Effective from 08/01/2020 through 07/31/2021. cont'n

## 2020-08-05 NOTE — Progress Notes (Signed)
Virtual Visit via Video Note The purpose of this virtual visit is to provide medical care while limiting exposure to the novel coronavirus.    Consent was obtained for video visit:  Yes.   Answered questions that patient had about telehealth interaction:  Yes.   I discussed the limitations, risks, security and privacy concerns of performing an evaluation and management service by telemedicine. I also discussed with the patient that there may be a patient responsible charge related to this service. The patient expressed understanding and agreed to proceed.  Pt location: Home Physician Location: office Name of referring provider:  Corwin Levins, MD I connected with Lauren Gonzalez at patients initiation/request on 08/08/2020 at  1:30 PM EDT by video enabled telemedicine application and verified that I am speaking with the correct person using two identifiers. Pt MRN:  [id number] Pt DOB:  [birth year] Video Participants:  Lauren Gonzalez   History of Present Illness:  Lauren Gonzalez is a 34 year old female with history of DVT whofollows up for migraines.  UPDATE: Started Botox in June.  She is status post 2 rounds. Overall, she is doing well.  Migraines are less severe and respond quickly with Ubrelvy. The only time it doesn't help is when she is on her period or if she hasn't eaten or kept hydrated.  During her period, her migraine can still last 1 1/2 days, however they are less severe and now able to function.  She just feels more irritable. Rescue therapy:ibuprofen or Ubrelvy.  Current NSAIDS:ibuprofen Current analgesics:none Current triptans:none Current ergotamine:none Current anti-emetic:Phenergan PR Current muscle relaxants:Cyclobenzaprine5mg  Current anti-anxiolytic:Clonazepam 1mg  twice daily PRN Current sleep aide:Ambien Current Antihypertensive medications:none Current Antidepressant medications:none Current Anticonvulsant medications:none Current  anti-CGRP:Ubrelvy Current Vitamins/Herbal/Supplements:none Current Antihistamines/Decongestants:none Other therapy:Botox Hormone/birth control:Norlyda (progesterone)  Caffeine:1 cup coffee a week. Energy drink infrequently. Occasional Pepsi Diet:At least 64 oz water. Occasional soda (Sprite, orange soda) Exercise:Not routine DepressionandAnxiety. Has Bipolar disorder. Currently stable. Other pain:no Sleep hygiene:Good when she is not working. 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  HISTORY: Onset:Since highschool. Worse over past year, worse related to menstrual cycle. Location:Starts Unilateral (either side) and the becomes bilatera, frontal, face, temples, radiates to back of head Quality:Starts as pressure and becomes pounding/throbbing Initial intensity:Severe. Shedenies new headache, thunderclap headache or severe headache that wakes herfrom sleep. Aura:none Premonitory Phase:Feels slightly "off" Postdrome:Tired Associated symptoms:Nausea,vomiting, photophobia, phonophobia, osmophobia, hair and teeth hurt.Shedenies associated unilateral numbness or weakness. Initial duration:All day (sometimes in to next day) Initial frequency:Mild-moderate migraine just before her period. Severe during her period, may have one other one outside period. (total 4 days a month) Initial frequency of abortive medication:4 days a month Triggers:Menses, weather changes, certain smells, emotional stress, caffeine-withdrawal, sleep deprivation) Relieving factors:Coolness, dark place Activity:aggravates  MRI of brain with and without contrat from 11/09/04 personally reviewed and was normal.   Past NSAIDS:Cambia, naproxen Past analgesics:Excedrin, BC powder; Tylenol #3 Past abortive triptans:Sumatriptan 100mg , Zomig 5mg  NS(effective but felt in a daze all day), Frova 2.5mg  (helpful but expensive) Past  abortive ergotamine:none Past muscle relaxants:Robaxin Past anti-emetic:promethazine Past antihypertensive medications:none Past antidepressant/antipsychoticmedications: Sertraline, Vyanse Past anticonvulsant medications:topiramate 50mg  twice daily(paresthesias) Past anti-CGRP:Aimovig (caused elevated blood pressure) Past vitamins/Herbal/Supplements:Magnesium, CoQ10 Past antihistamines/decongestants:Zyrtec Other past therapies:Changing birth control from Nuvaring to progesterone, trigger point injections/nerve blocks   Family history of headache:Dad (migraines, resolved)  Past Medical History: Past Medical History:  Diagnosis Date  . ADD (attention deficit disorder) 12/29/2013  . Allergic rhinitis, cause  unspecified   . DISORDER, BIPOLAR NEC   . DVT (deep venous thrombosis) (HCC)    in leg July 2020 (vaginal ring discontinued after blood clot/started on Xarelto - off now)  . Insomnia, unspecified   . MIGRAINE HEADACHE     Medications: Outpatient Encounter Medications as of 08/08/2020  Medication Sig  . albuterol (PROVENTIL HFA;VENTOLIN HFA) 108 (90 Base) MCG/ACT inhaler Inhale 2 puffs into the lungs every 6 (six) hours as needed for wheezing or shortness of breath.  . clonazePAM (KLONOPIN) 1 MG tablet TAKE 1 TABLET(1 MG) BY MOUTH TWICE DAILY AS NEEDED FOR ANXIETY  . Erenumab-aooe (AIMOVIG) 70 MG/ML SOAJ Inject 70 mg into the skin every 30 (thirty) days.  Marland Kitchen ibuprofen (ADVIL) 800 MG tablet TAKE 1 TABLET(800 MG) BY MOUTH EVERY 6 HOURS AS NEEDED  . NORLYDA 0.35 MG tablet TK 1 T PO QD  . Ubrogepant (UBRELVY) 100 MG TABS Take 1 tablet by mouth as needed (May repeat 1 tablet after 2 hours if needed.  Maximum 2 tablets in 24 hours).  . zolpidem (AMBIEN CR) 12.5 MG CR tablet TAKE 1 TABLET(12.5 MG) BY MOUTH AT BEDTIME AS NEEDED FOR SLEEP   No facility-administered encounter medications on file as of 08/08/2020.    Allergies: Allergies  Allergen Reactions  .  Influenza Vaccines Rash  . Levaquin [Levofloxacin] Hives and Rash    Family History: Family History  Problem Relation Age of Onset  . Colon polyps Father   . Depression Other   . Diabetes Other   . Stroke Other   . Heart disease Other   . Cancer Other        lung and ovarian cancer  . Arthritis Other     Social History: Social History   Socioeconomic History  . Marital status: Single    Spouse name: Not on file  . Number of children: Not on file  . Years of education: Not on file  . Highest education level: Not on file  Occupational History  . Occupation: Med. surg program at [employer]  Tobacco Use  . Smoking status: Former Games developer  . Smokeless tobacco: Never Used  Vaping Use  . Vaping Use: Some days  . Devices: uses CBD oil to vape  Substance and Sexual Activity  . Alcohol use: Yes    Comment:  2 bottles of wine/week  (not every week)  . Drug use: Yes    Types: Marijuana    Comment: no marijuana  anymore - uses CBD oil via  vaping   . Sexual activity: Not on file  Other Topics Concern  . Not on file  Social History Narrative   LIves in one level home with boyfriend. R handed. 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 Caffeine - drinks 16 oz soda a day but some days it doesn't have caffeine as is sprite/fanta. No regular exercise program.   Social Determinants of Health   Financial Resource Strain:   . Difficulty of Paying Living Expenses: Not on file  Food Insecurity:   . Worried About Programme researcher, broadcasting/film/video in the Last Year: Not on file  . Ran Out of Food in the Last Year: Not on file  Transportation Needs:   . Lack of Transportation (Medical): Not on file  . Lack of Transportation (Non-Medical): Not on file  Physical Activity:   . Days of Exercise per Week: Not on file  . Minutes of Exercise per Session: Not on file  Stress:   . Feeling of Stress : Not on file  Social Connections:   . Frequency of Communication with Friends and  Family: Not on file  . Frequency of Social Gatherings with Friends and Family: Not on file  . Attends Religious Services: Not on file  . Active Member of Clubs or Organizations: Not on file  . Attends Banker Meetings: Not on file  . Marital Status: Not on file  Intimate Partner Violence:   . Fear of Current or Ex-Partner: Not on file  . Emotionally Abused: Not on file  . Physically Abused: Not on file  . Sexually Abused: Not on file    Observations/Objective:   There were no vitals taken for this visit. No acute distress.  Alert and oriented.  Speech fluent and not dysarthric.  Language intact.   Assessment and Plan:   Chronic migraine without aura  1.  For preventative management, will continue Botox 2.  For abortive therapy, Ubrelvy 100mg .  May consider Sprix NS to treat intractable migraines during her period. 3.  Limit use of pain relievers to no more than 2 days out of week to prevent risk of rebound or medication-overuse headache. 4.  Keep headache diary 5.  Exercise, hydration, caffeine cessation, sleep hygiene, monitor for and avoid triggers 6.  Follow up for next Botox   Follow Up Instructions:    -I discussed the assessment and treatment plan with the patient. The patient was provided an opportunity to ask questions and all were answered. The patient agreed with the plan and demonstrated an understanding of the instructions.   The patient was advised to call back or seek an in-person evaluation if the symptoms worsen or if the condition fails to improve as anticipated.   , DO

## 2020-08-08 ENCOUNTER — Encounter: Payer: Self-pay | Admitting: Neurology

## 2020-08-08 ENCOUNTER — Telehealth (INDEPENDENT_AMBULATORY_CARE_PROVIDER_SITE_OTHER): Payer: BC Managed Care – PPO | Admitting: Neurology

## 2020-08-08 ENCOUNTER — Other Ambulatory Visit: Payer: Self-pay

## 2020-08-08 DIAGNOSIS — G43709 Chronic migraine without aura, not intractable, without status migrainosus: Secondary | ICD-10-CM | POA: Diagnosis not present

## 2020-08-12 ENCOUNTER — Encounter: Payer: Self-pay | Admitting: Neurology

## 2020-08-12 NOTE — Progress Notes (Addendum)
Per BV patient's SP is [facility].  11/5- Called them to set up patient's account and give verbal script for the Botox.    Fayth Welke Key: [id number] help? Call us at [phone] Status Sent to [facility] Botox 200UNIT solution Form Valinda Hoar [place] [employer] Electronic Request Form (CB)  11/15- received fax from [facility] [place]; [place] Approval valid from 08/16/20 to 08/16/21. Ref: [id number] good for 4 visits.

## 2020-09-08 DIAGNOSIS — G43709 Chronic migraine without aura, not intractable, without status migrainosus: Secondary | ICD-10-CM | POA: Diagnosis not present

## 2020-09-16 ENCOUNTER — Other Ambulatory Visit: Payer: Self-pay

## 2020-09-16 ENCOUNTER — Ambulatory Visit (INDEPENDENT_AMBULATORY_CARE_PROVIDER_SITE_OTHER): Payer: BC Managed Care – PPO | Admitting: Neurology

## 2020-09-16 DIAGNOSIS — G43709 Chronic migraine without aura, not intractable, without status migrainosus: Secondary | ICD-10-CM | POA: Diagnosis not present

## 2020-09-16 MED ORDER — ONABOTULINUMTOXINA 100 UNITS IJ SOLR
200.0000 [IU] | Freq: Once | INTRAMUSCULAR | Status: AC
  Administered 2020-09-16: 155 [IU] via INTRAMUSCULAR

## 2020-09-16 NOTE — Progress Notes (Signed)
[facility]   Procedure Note Botox  Attending: Dr.    Preoperative Diagnosis(es): Chronic migraine  Consent obtained from: The patient Benefits discussed included, but were not limited to decreased muscle tightness, increased joint range of motion, and decreased pain.  Risk discussed included, but were not limited pain and discomfort, bleeding, bruising, excessive weakness, venous thrombosis, muscle atrophy and dysphagia.  Anticipated outcomes of the procedure as well as he risks and benefits of the alternatives to the procedure, and the roles and tasks of the personnel to be involved, were discussed with the patient, and the patient consents to the procedure and agrees to proceed. A copy of the patient medication guide was given to the patient which explains the blackbox warning.  Patients identity and treatment sites confirmed Yes.  .  Details of Procedure: Skin was cleaned with alcohol. Prior to injection, the needle plunger was aspirated to make sure the needle was not within a blood vessel.  There was no blood retrieved on aspiration.    Following is a summary of the muscles injected  And the amount of Botulinum toxin used:  Dilution 200 units of Botox was reconstituted with 4 ml of preservative free normal saline. Time of reconstitution: At the time of the office visit (<30 minutes prior to injection)   Injections  155 total units of Botox was injected with a 30 gauge needle.  Injection Sites: L occipitalis: 15 units- 3 sites  R occiptalis: 15 units- 3 sites  L upper trapezius: 15 units- 3 sites R upper trapezius: 15 units- 3 sits          L paraspinal: 10 units- 2 sites R paraspinal: 10 units- 2 sites  Face L frontalis(2 injection sites):10 units   R frontalis(2 injection sites):10 units         L corrugator: 5 units   R corrugator: 5 units           Procerus: 5 units   L temporalis: 20 units R temporalis: 20 units   Agent:  200 units of botulinum Type  A (Onobotulinum Toxin type A) was reconstituted with 4 ml of preservative free normal saline.  Time of reconstitution: At the time of the office visit (<30 minutes prior to injection)     Total injected (Units): 155  Total wasted (Units): 45  Patient tolerated procedure well without complications.   Reinjection is anticipated in 3 months. Return to clinic in 4 1/2 months.   

## 2020-11-28 DIAGNOSIS — G43709 Chronic migraine without aura, not intractable, without status migrainosus: Secondary | ICD-10-CM | POA: Diagnosis not present

## 2020-12-23 ENCOUNTER — Ambulatory Visit: Payer: BC Managed Care – PPO | Admitting: Neurology

## 2020-12-23 ENCOUNTER — Other Ambulatory Visit: Payer: Self-pay

## 2020-12-23 DIAGNOSIS — G43709 Chronic migraine without aura, not intractable, without status migrainosus: Secondary | ICD-10-CM | POA: Diagnosis not present

## 2020-12-23 MED ORDER — ONABOTULINUMTOXINA 100 UNITS IJ SOLR
200.0000 [IU] | Freq: Once | INTRAMUSCULAR | Status: AC
  Administered 2020-12-23: 155 [IU] via INTRAMUSCULAR

## 2020-12-23 NOTE — Progress Notes (Signed)
[facility]   Procedure Note Botox  Attending: Dr. Adam Jaffe  Preoperative Diagnosis(es): Chronic migraine  Consent obtained from: The patient Benefits discussed included, but were not limited to decreased muscle tightness, increased joint range of motion, and decreased pain.  Risk discussed included, but were not limited pain and discomfort, bleeding, bruising, excessive weakness, venous thrombosis, muscle atrophy and dysphagia.  Anticipated outcomes of the procedure as well as he risks and benefits of the alternatives to the procedure, and the roles and tasks of the personnel to be involved, were discussed with the patient, and the patient consents to the procedure and agrees to proceed. A copy of the patient medication guide was given to the patient which explains the blackbox warning.  Patients identity and treatment sites confirmed Yes.  .  Details of Procedure: Skin was cleaned with alcohol. Prior to injection, the needle plunger was aspirated to make sure the needle was not within a blood vessel.  There was no blood retrieved on aspiration.    Following is a summary of the muscles injected  And the amount of Botulinum toxin used:  Dilution 200 units of Botox was reconstituted with 4 ml of preservative free normal saline. Time of reconstitution: At the time of the office visit (<30 minutes prior to injection)   Injections  155 total units of Botox was injected with a 30 gauge needle.  Injection Sites: L occipitalis: 15 units- 3 sites  R occiptalis: 15 units- 3 sites  L upper trapezius: 15 units- 3 sites R upper trapezius: 15 units- 3 sits          L paraspinal: 10 units- 2 sites R paraspinal: 10 units- 2 sites  Face L frontalis(2 injection sites):10 units   R frontalis(2 injection sites):10 units         L corrugator: 5 units   R corrugator: 5 units           Procerus: 5 units   L temporalis: 20 units R temporalis: 20 units   Agent:  200 units of botulinum Type  A (Onobotulinum Toxin type A) was reconstituted with 4 ml of preservative free normal saline.  Time of reconstitution: At the time of the office visit (<30 minutes prior to injection)     Total injected (Units): 155  Total wasted (Units): none wasted  Patient tolerated procedure well without complications.   Reinjection is anticipated in 3 months.    

## 2021-01-13 ENCOUNTER — Encounter: Payer: Self-pay | Admitting: Neurology

## 2021-02-05 NOTE — Progress Notes (Signed)
NEUROLOGY FOLLOW UP OFFICE NOTE  Lauren Gonzalez [id number]  Assessment/Plan:   Chronic Migraine without aura  1.  Migraine prevention:  Botox 2.  Migraine rescue:  Ubrelvy 100mg  3.  Limit use of pain relievers to no more than 2 days out of week to prevent risk of rebound or medication-overuse headache. 4.  Keep headache diary 5.  Follow up for next Botox  Subjective:  Lauren Gonzalez is a 35year old female with history of DVT whofollows up for migraines.  UPDATE: Intensity:  Mild Duration:  1 day  Frequency:  No migraines since starting Botox May still have some slight nausea and eyes feel tired. Able to function.   She previously talked about increased jaw stiffness - difficulty opening mouth when eating.  Last botox I raised where I injected one of the temporal injections and has noted improvement.  Still with mild difficulty. Rescue therapy:ibuprofen or Ubrelvy.  Current NSAIDS:ibuprofen Current analgesics:none Current triptans:none Current ergotamine:none Current anti-emetic:Phenergan PR Current muscle relaxants:Cyclobenzaprine5mg  Current anti-anxiolytic:Clonazepam 1mg  twice daily PRN Current sleep aide:Ambien Current Antihypertensive medications:none Current Antidepressant medications:none Current Anticonvulsant medications:none Current anti-CGRP:Ubrelvy Current Vitamins/Herbal/Supplements:none Current Antihistamines/Decongestants:none Other therapy:Botox Hormone/birth control:Norlyda (progesterone)  Caffeine:1 cup coffee a week. Energy drink infrequently. Occasional Pepsi Diet:At least 64 oz water. Occasional soda (Sprite, orange soda) Exercise:Not routine DepressionandAnxiety. Has Bipolar disorder. Currently stable. Other pain:no Sleep hygiene:Good when she is not working. 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  HISTORY: Onset:Since highschool. Worse over past year, worse  related to menstrual cycle. Location:Starts Unilateral (either side) and the becomes bilatera, frontal, face, temples, radiates to back of head Quality:Starts as pressure and becomes pounding/throbbing Initial intensity:Severe. Shedenies new headache, thunderclap headache or severe headache that wakes herfrom sleep. Aura:none Premonitory Phase:Feels slightly "off" Postdrome:Tired Associated symptoms:Nausea,vomiting, photophobia, phonophobia, osmophobia, hair and teeth hurt.Shedenies associated unilateral numbness or weakness. Initial duration:All day (sometimes in to next day) Initial frequency:Mild-moderate migraine just before her period. Severe during her period, may have one other one outside period. (total 4 days a month) Initial frequency of abortive medication:4 days a month Triggers:Menses, weather changes, certain smells, emotional stress, caffeine-withdrawal, sleep deprivation) Relieving factors:Coolness, dark place Activity:aggravates  MRI of brain with and without contrat from 11/09/04 personally reviewed and was normal.   Past NSAIDS:Cambia, naproxen Past analgesics:Excedrin, BC powder; Tylenol #3 Past abortive triptans:Sumatriptan 100mg , Zomig 5mg  NS(effective but felt in a daze all day), Frova 2.5mg  (helpful but expensive) Past abortive ergotamine:none Past muscle relaxants:Robaxin Past anti-emetic:promethazine Past antihypertensive medications:none Past antidepressant/antipsychoticmedications: Sertraline, Vyanse Past anticonvulsant medications:topiramate 50mg  twice daily(paresthesias) Past anti-CGRP:Aimovig (caused elevated blood pressure) Past vitamins/Herbal/Supplements:Magnesium, CoQ10 Past antihistamines/decongestants:Zyrtec Other past therapies:Changing birth control from Nuvaring to progesterone, trigger point injections/nerve blocks   Family history of headache:Dad (migraines, resolved)  PAST  MEDICAL HISTORY: Past Medical History:  Diagnosis Date  . ADD (attention deficit disorder) 12/29/2013  . Allergic rhinitis, cause unspecified   . DISORDER, BIPOLAR NEC   . DVT (deep venous thrombosis) (HCC)    in leg July 2020 (vaginal ring discontinued after blood clot/started on Xarelto - off now)  . Insomnia, unspecified   . MIGRAINE HEADACHE     MEDICATIONS: Current Outpatient Medications on File Prior to Visit  Medication Sig Dispense Refill  . albuterol (PROVENTIL HFA;VENTOLIN HFA) 108 (90 Base) MCG/ACT inhaler Inhale 2 puffs into the lungs every 6 (six) hours as needed for wheezing or shortness of breath. 1 Inhaler 0  . clonazePAM (KLONOPIN) 1 MG tablet TAKE 1 TABLET(1 MG)  BY MOUTH TWICE DAILY AS NEEDED FOR ANXIETY 60 tablet 5  . ibuprofen (ADVIL) 800 MG tablet TAKE 1 TABLET(800 MG) BY MOUTH EVERY 6 HOURS AS NEEDED 60 tablet 2  . NORLYDA 0.35 MG tablet TK 1 T PO QD    . Ubrogepant (UBRELVY) 100 MG TABS Take 1 tablet by mouth as needed (May repeat 1 tablet after 2 hours if needed.  Maximum 2 tablets in 24 hours). 16 tablet 3  . zolpidem (AMBIEN CR) 12.5 MG CR tablet TAKE 1 TABLET(12.5 MG) BY MOUTH AT BEDTIME AS NEEDED FOR SLEEP 90 tablet 1   No current facility-administered medications on file prior to visit.    ALLERGIES: Allergies  Allergen Reactions  . Influenza Vaccines Rash  . Levaquin [Levofloxacin] Hives and Rash    FAMILY HISTORY: Family History  Problem Relation Age of Onset  . Colon polyps Father   . Depression Other   . Diabetes Other   . Stroke Other   . Heart disease Other   . Cancer Other        lung and ovarian cancer  . Arthritis Other       Objective:  Blood pressure 139/84, pulse 79, height 5\' 5"  (1.651 m), weight 177 lb (80.3 kg), SpO2 98 %. General: No acute distress.  Patient appears well-groomed.   Head:  Normocephalic/atraumatic Eyes:  Fundi examined but not visualized Neck: supple, no paraspinal tenderness, full range of motion Heart:   Regular rate and rhythm Lungs:  Clear to auscultation bilaterally Back: No paraspinal tenderness Neurological Exam: alert and oriented to person, place, and time. Speech fluent and not dysarthric, language intact.  CN II-XII intact. Bulk and tone normal, muscle strength 5/5 throughout.  Sensation to light touch, temperature and vibration intact.  Deep tendon reflexes 2+ throughout, toes downgoing.  Finger to nose and heel to shin testing intact.  Gait normal, Romberg negative.     , DO  CC: Shon Millet, MD

## 2021-02-06 ENCOUNTER — Encounter: Payer: Self-pay | Admitting: Neurology

## 2021-02-06 ENCOUNTER — Other Ambulatory Visit: Payer: Self-pay

## 2021-02-06 ENCOUNTER — Ambulatory Visit (INDEPENDENT_AMBULATORY_CARE_PROVIDER_SITE_OTHER): Payer: BC Managed Care – PPO | Admitting: Neurology

## 2021-02-06 VITALS — BP 139/84 | HR 79 | Ht 65.0 in | Wt 177.0 lb

## 2021-02-06 DIAGNOSIS — G43009 Migraine without aura, not intractable, without status migrainosus: Secondary | ICD-10-CM

## 2021-02-06 NOTE — Patient Instructions (Signed)
Follow up for next Botox °

## 2021-02-24 ENCOUNTER — Telehealth: Payer: Self-pay

## 2021-02-24 NOTE — Telephone Encounter (Signed)
LVM for pt, Please give your insurance a call to give consent and schedule delivery for botox.  Approval given for Botox 08/16/20-08/16/21 with 4 visit.

## 2021-03-15 ENCOUNTER — Telehealth: Payer: Self-pay | Admitting: Neurology

## 2021-03-15 NOTE — Telephone Encounter (Signed)
Pharmacy called, they need a new PA done for Botox with the new [phone]

## 2021-03-16 NOTE — Telephone Encounter (Signed)
Pt has talked to allance and given consent for her next 3 botox

## 2021-03-24 ENCOUNTER — Ambulatory Visit: Payer: BC Managed Care – PPO | Admitting: Neurology

## 2021-03-28 NOTE — Telephone Encounter (Signed)
Spoke with Barbara Cower K @ 8:50 AM [facility] for Botox - it appears that previous botox was shipped from [facility] # 99 and this is now # 50 and there needs to be an update to the pharmacy NPI.  Waiting on reply as to how to proceed- may need to create a new PA//  [facility]

## 2021-03-30 NOTE — Telephone Encounter (Signed)
Spoke to [place] w/ [facility] [phone] She has expedited and sent another request to the benefits department  for review SP Pharmacy NPI issue   50 vs 99 - they may just revert back to 99 where previous botox was shipped from  Also - there may be a billing issue where they may have billed the pharmacy and not the medical side of plan  Waiting on return call

## 2021-04-03 NOTE — Telephone Encounter (Signed)
Called [facility] they said to call [employer] to do a new PA. [employer]BS said a new PA will be denied because a valid one is on file. Went back and forth 5 phone calls and 2 hours later. A new PA was done to  change pharmacy NPI. [facility] said the old NPI is no longer valid. Spoke with JohnM. at [facility] he walked me through a new PA through [employer] (Key: [id number])  Your information has been submitted to [facility] [place]. [employer] [place] will review the request and notify you of the determination decision directly, typically within 72 hours of receiving all information.  You will also receive your request decision electronically. To check for an update later, open this request again from your dashboard.  If [employer] has not responded within the specified timeframe or if you have any questions about your PA submission, contact [employer] [place] directly at [phone].  Diamond from [employer] requested I fax the new PA once approved said it will process faster. FAX# [phone]

## 2021-04-11 NOTE — Telephone Encounter (Signed)
Followed up with [employer] Pharmacy on 04/11/2021 @ [phone] w/ Jeanice Lim    PA is still under review and should have a response by end of day today, if not to call back on 7/6 Case ID#  [id number]

## 2021-04-13 NOTE — Telephone Encounter (Signed)
Received another fax from [facility] stating that it needs more information. It needs the new NPI of the SP I almost fell out of my chair.   Called BCBS [phone] option #3, option #4 then option #1 spoke with Tresa Endo explained the ordeal and he said he will fix it and it has to be sent for review which will take 24-48 hours.   I explained her PA was already approved just the pharmacy rebranded and got a new NPI.  New NPI [id number] He asked if that was [facility] or [facility]. I said I thought it was the same but they have rebranded so I did not know the answer but they gave me their new NPI.  Sheena called Leeyah and let her know her appointment had to be cancelled and we will reschedule once we hear back from [facility].

## 2021-04-14 ENCOUNTER — Ambulatory Visit: Payer: BC Managed Care – PPO | Admitting: Neurology

## 2021-04-14 ENCOUNTER — Telehealth: Payer: Self-pay | Admitting: Neurology

## 2021-04-14 NOTE — Telephone Encounter (Signed)
Advised pt that we haven't received a response from insurance or Pharmacy in regards to her Botox.

## 2021-04-14 NOTE — Telephone Encounter (Signed)
Pt called in and left message with Access Nurse. She wants to find out when the Botox will come in?

## 2021-04-24 ENCOUNTER — Telehealth: Payer: Self-pay | Admitting: Neurology

## 2021-04-24 NOTE — Telephone Encounter (Signed)
Pt called in stating that in the mean time of waiting to get approval for her Botox her migraines are back full force to how it was before the Botox. She would like some advice on what to do?

## 2021-04-24 NOTE — Progress Notes (Signed)
Spoke to [employer]. NPI was updated on the PA approval. Pt approved for 4 visit.   New NPI [id number].  Spoke to [employer] at [employer] they have verify the coverage of the PA and once that is done they send medication for delivery.  Florentina Addison will send the a note so this can be done urgently. And hopefully it will be done in the next 24-48 hr.  Pt is aware that it may take another 24-48 hrs. She will keep an eye just in case they call her for consent.

## 2021-04-24 NOTE — Telephone Encounter (Signed)
Spoke to [employer]. NPI was updated on the PA approval. Pt approved for 4 visit.   I will call [facility] to have Botox delivered.    Pt aware that [facility] has to run their own Authorization first once that is done they will deliver.

## 2021-04-28 NOTE — Progress Notes (Signed)
[address] RX [facility] [phone] spoke with Katie for update where we are in the process of getting the botox.   She said it was sent as urgent to the patient financial services to apply co-pay assistance and it should be cleared by the end of today. They will then send the request over to the pharmacy to schedule delivery. We are to call them on Monday morning to set up delivery pending all the above mentioned is completed.

## 2021-05-01 NOTE — Telephone Encounter (Signed)
Patient called and said she spoke with her insurance company on Friday. She'd like to know if they have reached out to our office yet about her Botox so she can move her appointment up.

## 2021-05-01 NOTE — Progress Notes (Signed)
Tried calling pt no answer

## 2021-05-01 NOTE — Progress Notes (Signed)
Called RX [facility] [phone], Spoke to [place] Botox should be delivered Wednesday 05/03/21. [place] address given to make sure delivered to right address.    Dr.Jaffe when should we add her to the schedule.

## 2021-05-02 DIAGNOSIS — G43709 Chronic migraine without aura, not intractable, without status migrainosus: Secondary | ICD-10-CM | POA: Diagnosis not present

## 2021-05-03 NOTE — Progress Notes (Signed)
Botox delivered to office today.

## 2021-05-04 ENCOUNTER — Other Ambulatory Visit: Payer: Self-pay

## 2021-05-04 ENCOUNTER — Ambulatory Visit: Payer: BC Managed Care – PPO | Admitting: Neurology

## 2021-05-04 DIAGNOSIS — G43709 Chronic migraine without aura, not intractable, without status migrainosus: Secondary | ICD-10-CM | POA: Diagnosis not present

## 2021-05-04 MED ORDER — ONABOTULINUMTOXINA 100 UNITS IJ SOLR
200.0000 [IU] | Freq: Once | INTRAMUSCULAR | Status: AC
  Administered 2021-05-04: 200 [IU] via INTRAMUSCULAR

## 2021-05-04 NOTE — Progress Notes (Signed)

## 2021-07-04 ENCOUNTER — Telehealth: Payer: Self-pay

## 2021-07-04 NOTE — Telephone Encounter (Signed)
New message   Call the patient at [phone] for specialty pharmacy information, the mailbox is full.   Upcoming botox appt with Dr. Everlena Cooper on  08/04/21   Salimah Omura Key: [id number] help? Call us at [phone] Status Sent to [employer] [facility] Botox 200UNIT solution Form [employer] Commercial Medical Benefit Electronic Request Form (CB)

## 2021-07-06 NOTE — Telephone Encounter (Signed)
F/u   Received approval documentation via fax from [employer] [place].   [facility]ty [facility]   Effective date 04/03/2021 to 04/02/2022   Quantity approved : 4.0  visit

## 2021-07-14 ENCOUNTER — Ambulatory Visit: Payer: BC Managed Care – PPO | Admitting: Neurology

## 2021-07-18 ENCOUNTER — Telehealth: Payer: Self-pay

## 2021-07-18 NOTE — Telephone Encounter (Signed)
New message   Lauren Gonzalez Key: [id number] help? Call us at [phone]  Status Sent to Plan today  Drug Bernita Raisin 100MG  tablets  Form [place] Commercial Electronic Request Form (CB)

## 2021-07-18 NOTE — Telephone Encounter (Signed)
F/u   Lauren Gonzalez Key: [id number] help? Call us at [phone]  Outcome Approved today  Effective from 07/18/2021 through 07/17/2022.  Drug Bernita Raisin 100MG  tablets

## 2021-07-27 ENCOUNTER — Ambulatory Visit: Payer: BC Managed Care – PPO | Admitting: Internal Medicine

## 2021-07-28 ENCOUNTER — Telehealth: Payer: Self-pay | Admitting: Internal Medicine

## 2021-07-28 ENCOUNTER — Other Ambulatory Visit: Payer: BC Managed Care – PPO

## 2021-07-28 ENCOUNTER — Telehealth: Payer: Self-pay

## 2021-07-28 ENCOUNTER — Ambulatory Visit (INDEPENDENT_AMBULATORY_CARE_PROVIDER_SITE_OTHER): Payer: BC Managed Care – PPO | Admitting: Internal Medicine

## 2021-07-28 ENCOUNTER — Other Ambulatory Visit: Payer: Self-pay

## 2021-07-28 ENCOUNTER — Encounter: Payer: Self-pay | Admitting: Internal Medicine

## 2021-07-28 VITALS — BP 126/70 | HR 103 | Ht 65.0 in | Wt 174.0 lb

## 2021-07-28 DIAGNOSIS — Z1159 Encounter for screening for other viral diseases: Secondary | ICD-10-CM

## 2021-07-28 DIAGNOSIS — R739 Hyperglycemia, unspecified: Secondary | ICD-10-CM

## 2021-07-28 DIAGNOSIS — E782 Mixed hyperlipidemia: Secondary | ICD-10-CM

## 2021-07-28 DIAGNOSIS — F419 Anxiety disorder, unspecified: Secondary | ICD-10-CM

## 2021-07-28 DIAGNOSIS — S93401A Sprain of unspecified ligament of right ankle, initial encounter: Secondary | ICD-10-CM | POA: Diagnosis not present

## 2021-07-28 NOTE — Progress Notes (Signed)
Patient ID: Lauren Gonzalez, female   DOB: [birth year], 35 y.o.   MRN: [id number]        Chief Complaint: follow up recent right ankle sprain, hyperglycemia, hld, anxiety       HPI:  Lauren Gonzalez is a 35 y.o. female here overall doing well, but did have a mild right lateral ankle sprain several days ago, where the pain and swelling are essentially resolved, and no bruising noted.  Was asked by her employer for a note to certify ok to return to work.  Pt denies chest pain, increased sob or doe, wheezing, orthopnea, PND, increased LE swelling, palpitations, dizziness or syncope.   Pt denies polydipsia, polyuria, or new focal neuro s/s.    No other new complaints  Wt Readings from Last 3 Encounters:  07/28/21 174 lb (78.9 kg)  02/06/21 177 lb (80.3 kg)  02/24/20 180 lb (81.6 kg)   BP Readings from Last 3 Encounters:  07/28/21 126/70  02/06/21 139/84  05/29/19 140/90         Past Medical History:  Diagnosis Date   ADD (attention deficit disorder) 12/29/2013   Allergic rhinitis, cause unspecified    DISORDER, BIPOLAR NEC    DVT (deep venous thrombosis) (HCC)    in leg July 2020 (vaginal ring discontinued after blood clot/started on Xarelto - off now)   Insomnia, unspecified    MIGRAINE HEADACHE    Past Surgical History:  Procedure Laterality Date   CHOLECYSTECTOMY     FRENULECTOMY, LINGUAL     of the base of the tongue   s/p left hip labral tear surgury  dec. 2010   TONSILLECTOMY     TONSILLECTOMY N/A 03/22/2018   Procedure: CAUTERY OF POST TONSILLAR BLEEDING;  Surgeon: Christia Reading, MD;  Location: [facility] OR;  Service: ENT;  Laterality: N/A;   TYMPANOSTOMY TUBE PLACEMENT     as a child   WISDOM TOOTH EXTRACTION      reports that she has quit smoking. She has never used smokeless tobacco. She reports current alcohol use. She reports current drug use. Drug: Marijuana. family history includes Arthritis in an other family member; Cancer in an other family member; Colon polyps in her  father; Depression in an other family member; Diabetes in an other family member; Heart disease in an other family member; Stroke in an other family member. Allergies  Allergen Reactions   Influenza Vaccines Rash   Levaquin [Levofloxacin] Hives and Rash   Current Outpatient Medications on File Prior to Visit  Medication Sig Dispense Refill   albuterol (PROVENTIL HFA;VENTOLIN HFA) 108 (90 Base) MCG/ACT inhaler Inhale 2 puffs into the lungs every 6 (six) hours as needed for wheezing or shortness of breath. 1 Inhaler 0   clonazePAM (KLONOPIN) 1 MG tablet TAKE 1 TABLET(1 MG) BY MOUTH TWICE DAILY AS NEEDED FOR ANXIETY 60 tablet 5   ibuprofen (ADVIL) 800 MG tablet TAKE 1 TABLET(800 MG) BY MOUTH EVERY 6 HOURS AS NEEDED 60 tablet 2   NORLYDA 0.35 MG tablet TK 1 T PO QD     OnabotulinumtoxinA (BOTOX IJ) Botox     Ubrogepant (UBRELVY) 100 MG TABS Take 1 tablet by mouth as needed (May repeat 1 tablet after 2 hours if needed.  Maximum 2 tablets in 24 hours). 16 tablet 3   zolpidem (AMBIEN CR) 12.5 MG CR tablet TAKE 1 TABLET(12.5 MG) BY MOUTH AT BEDTIME AS NEEDED FOR SLEEP 90 tablet 1   No current facility-administered medications on file prior to  visit.        ROS:  All others reviewed and negative.  Objective        PE:  BP 126/70 (BP Location: Right Arm, Patient Position: Sitting, Cuff Size: Large)   Pulse (!) 103   Ht 5\' 5"  (1.651 m)   Wt 174 lb (78.9 kg)   SpO2 99%   BMI 28.96 kg/m                 Constitutional: Pt appears in NAD               HENT: Head: NCAT.                Right Ear: External ear normal.                 Left Ear: External ear normal.                Eyes: . Pupils are equal, round, and reactive to light. Conjunctivae and EOM are normal               Nose: without d/c or deformity               Neck: Neck supple. Gross normal ROM               Cardiovascular: Normal rate and regular rhythm.                 Pulmonary/Chest: Effort normal and breath sounds without rales  or wheezing.                Abd:  Soft, NT, ND, + BS, no organomegaly               Neurological: Pt is alert. At baseline orientation, motor grossly intact               Skin: Skin is warm. No rashes, no other new lesions, LE edeme - none; right lateral ankle without significant tender, bruising and only very mild swelling               Psychiatric: Pt behavior is normal without agitation   Micro: none  Cardiac tracings I have personally interpreted today:  none  Pertinent Radiological findings (summarize): none   Lab Results  Component Value Date   WBC 7.4 11/13/2018   HGB 12.7 11/13/2018   HCT 37.7 11/13/2018   PLT 350.0 11/13/2018   GLUCOSE 83 11/13/2018   CHOL 248 (H) 11/13/2018   TRIG 282.0 (H) 11/13/2018   HDL 65.60 11/13/2018   LDLDIRECT 143.0 11/13/2018   LDLCALC 117 (H) 07/01/2017   ALT 9 11/13/2018   AST 12 11/13/2018   NA 136 11/13/2018   K 3.9 11/13/2018   CL 104 11/13/2018   CREATININE 0.70 11/13/2018   BUN 6 11/13/2018   CO2 22 11/13/2018   TSH 0.93 11/13/2018   HGBA1C 5.0 11/13/2018   Assessment/Plan:  Lauren Gonzalez is a 35 y.o. White or Caucasian [1] female with  has a past medical history of ADD (attention deficit disorder) (12/29/2013), Allergic rhinitis, cause unspecified, DISORDER, BIPOLAR NEC, DVT (deep venous thrombosis) (HCC), Insomnia, unspecified, and MIGRAINE HEADACHE.  Anxiety Stable, no need for med change at this time,  to f/u any worsening symptoms or concerns  Mixed hyperlipidemia Lab Results  Component Value Date   LDLCALC 117 (H) 07/01/2017   Uncontrolled mild, pt to continue current lower chol diet  Hyperglycemia Lab Results  Component Value Date  HGBA1C 5.0 11/13/2018   Stable, pt fo cont diet, and f/u a1c next labs   Ankle sprain Very mild, essentilly resolved already, no need for imaging, for tylenol prn, ok for letter to employer  Followup: Return in about 5 days (around 08/02/2021).  Oliver Barre, MD 07/29/2021 6:05  PM [facility] - [facility] Internal Medicine

## 2021-07-28 NOTE — Telephone Encounter (Signed)
No sorry this was only done as routine screen, and I meant to let you know, as we are supposed to do this one time routinely for everyone (all patients) to satisfy the recommendations of the [place] Task Force for Health Preventive Services and the [facility].

## 2021-07-28 NOTE — Telephone Encounter (Signed)
New message   I called & spoke with the patient at [phone] and advised the patient to contact her [facility] at [phone] for Botox consent delivery to the office for an upcoming appt on 08/04/21 with Dr. Everlena Cooper.    The patient advise consent on file.

## 2021-07-28 NOTE — Telephone Encounter (Signed)
F/u   I called [facility] at [phone] and spoke with a representative.   Botox confirmation delivery date is Tuesday, August 01, 2021.   Office address and hours of operation are confirm

## 2021-07-28 NOTE — Patient Instructions (Signed)
Ok for the work note today  Please continue all other medications as before, and refills have been done if requested.  Please have the pharmacy call with any other refills you may need.  Please keep your appointments with your specialists as you may have planned  Please keep your Oct 26 appt, and ok to go to the [facility] lab (no appt needed) to have your lab testing done before your visit

## 2021-07-28 NOTE — Telephone Encounter (Signed)
Pt is confused on why she needs to be screened for Hep C. She said it was not explained to her and is making her concerned that something was seen but wasn't disclosed.   Requesting a callback.

## 2021-07-29 ENCOUNTER — Encounter: Payer: Self-pay | Admitting: Internal Medicine

## 2021-07-29 DIAGNOSIS — S93409A Sprain of unspecified ligament of unspecified ankle, initial encounter: Secondary | ICD-10-CM | POA: Insufficient documentation

## 2021-07-29 NOTE — Assessment & Plan Note (Signed)
Lab Results  Component Value Date   LDLCALC 117 (H) 07/01/2017   Uncontrolled mild, pt to continue current lower chol diet

## 2021-07-29 NOTE — Assessment & Plan Note (Signed)
Lab Results  Component Value Date   HGBA1C 5.0 11/13/2018   Stable, pt fo cont diet, and f/u a1c next labs

## 2021-07-29 NOTE — Assessment & Plan Note (Signed)
Stable, no need for med change at this time,  to f/u any worsening symptoms or concerns

## 2021-07-29 NOTE — Assessment & Plan Note (Signed)
Very mild, essentilly resolved already, no need for imaging, for tylenol prn, ok for letter to employer

## 2021-07-31 DIAGNOSIS — G43709 Chronic migraine without aura, not intractable, without status migrainosus: Secondary | ICD-10-CM | POA: Diagnosis not present

## 2021-07-31 LAB — HEPATITIS C ANTIBODY
Hepatitis C Ab: NONREACTIVE
SIGNAL TO CUT-OFF: 0.07 (ref <1.00)

## 2021-07-31 NOTE — Telephone Encounter (Signed)
Patient notified and verbalizes understanding.

## 2021-08-02 ENCOUNTER — Encounter: Payer: Self-pay | Admitting: Internal Medicine

## 2021-08-02 ENCOUNTER — Other Ambulatory Visit: Payer: Self-pay

## 2021-08-02 ENCOUNTER — Ambulatory Visit (INDEPENDENT_AMBULATORY_CARE_PROVIDER_SITE_OTHER): Payer: BC Managed Care – PPO | Admitting: Internal Medicine

## 2021-08-02 VITALS — BP 122/78 | HR 89 | Temp 98.4°F | Ht 65.0 in | Wt 169.0 lb

## 2021-08-02 DIAGNOSIS — E538 Deficiency of other specified B group vitamins: Secondary | ICD-10-CM | POA: Diagnosis not present

## 2021-08-02 DIAGNOSIS — S93401A Sprain of unspecified ligament of right ankle, initial encounter: Secondary | ICD-10-CM | POA: Diagnosis not present

## 2021-08-02 DIAGNOSIS — Z0000 Encounter for general adult medical examination without abnormal findings: Secondary | ICD-10-CM | POA: Insufficient documentation

## 2021-08-02 DIAGNOSIS — E559 Vitamin D deficiency, unspecified: Secondary | ICD-10-CM | POA: Diagnosis not present

## 2021-08-02 DIAGNOSIS — R739 Hyperglycemia, unspecified: Secondary | ICD-10-CM | POA: Diagnosis not present

## 2021-08-02 DIAGNOSIS — E782 Mixed hyperlipidemia: Secondary | ICD-10-CM | POA: Diagnosis not present

## 2021-08-02 DIAGNOSIS — I82491 Acute embolism and thrombosis of other specified deep vein of right lower extremity: Secondary | ICD-10-CM

## 2021-08-02 LAB — CBC WITH DIFFERENTIAL/PLATELET
Basophils Absolute: 0 10*3/uL (ref 0.0–0.1)
Basophils Relative: 0.6 % (ref 0.0–3.0)
Eosinophils Absolute: 0.1 10*3/uL (ref 0.0–0.7)
Eosinophils Relative: 1.2 % (ref 0.0–5.0)
HCT: 42.2 % (ref 36.0–46.0)
Hemoglobin: 14 g/dL (ref 12.0–15.0)
Lymphocytes Relative: 35.2 % (ref 12.0–46.0)
Lymphs Abs: 2.8 10*3/uL (ref 0.7–4.0)
MCHC: 33.2 g/dL (ref 30.0–36.0)
MCV: 92.4 fl (ref 78.0–100.0)
Monocytes Absolute: 0.7 10*3/uL (ref 0.1–1.0)
Monocytes Relative: 8.3 % (ref 3.0–12.0)
Neutro Abs: 4.3 10*3/uL (ref 1.4–7.7)
Neutrophils Relative %: 54.7 % (ref 43.0–77.0)
Platelets: 309 10*3/uL (ref 150.0–400.0)
RBC: 4.57 Mil/uL (ref 3.87–5.11)
RDW: 12.6 % (ref 11.5–15.5)
WBC: 7.9 10*3/uL (ref 4.0–10.5)

## 2021-08-02 LAB — BASIC METABOLIC PANEL
BUN: 8 mg/dL (ref 6–23)
CO2: 27 mEq/L (ref 19–32)
Calcium: 9.3 mg/dL (ref 8.4–10.5)
Chloride: 101 mEq/L (ref 96–112)
Creatinine, Ser: 0.81 mg/dL (ref 0.40–1.20)
GFR: 94.52 mL/min (ref >60.00)
Glucose, Bld: 85 mg/dL (ref 70–99)
Potassium: 4.1 mEq/L (ref 3.5–5.1)
Sodium: 136 mEq/L (ref 135–145)

## 2021-08-02 LAB — HEPATIC FUNCTION PANEL
ALT: 14 U/L (ref 0–35)
AST: 15 U/L (ref 0–37)
Albumin: 4.8 g/dL (ref 3.5–5.2)
Alkaline Phosphatase: 54 U/L (ref 39–117)
Bilirubin, Direct: 0.3 mg/dL (ref 0.0–0.3)
Total Bilirubin: 2 mg/dL — ABNORMAL HIGH (ref 0.2–1.2)
Total Protein: 8.1 g/dL (ref 6.0–8.3)

## 2021-08-02 LAB — URINALYSIS, ROUTINE W REFLEX MICROSCOPIC
Bilirubin Urine: NEGATIVE
Ketones, ur: NEGATIVE
Leukocytes,Ua: NEGATIVE
Nitrite: NEGATIVE
Specific Gravity, Urine: 1.02 (ref 1.000–1.030)
Total Protein, Urine: NEGATIVE
Urine Glucose: NEGATIVE
Urobilinogen, UA: 0.2 (ref 0.0–1.0)
pH: 6.5 (ref 5.0–8.0)

## 2021-08-02 LAB — LIPID PANEL
Cholesterol: 200 mg/dL (ref 0–200)
HDL: 68.3 mg/dL (ref >39.00)
LDL Cholesterol: 119 mg/dL — ABNORMAL HIGH (ref 0–99)
NonHDL: 131.31
Total CHOL/HDL Ratio: 3
Triglycerides: 63 mg/dL (ref 0.0–149.0)
VLDL: 12.6 mg/dL (ref 0.0–40.0)

## 2021-08-02 LAB — VITAMIN B12: Vitamin B-12: 230 pg/mL (ref 211–911)

## 2021-08-02 LAB — TSH: TSH: 2.11 u[IU]/mL (ref 0.35–5.50)

## 2021-08-02 LAB — HEMOGLOBIN A1C: Hgb A1c MFr Bld: 5.4 % (ref 4.6–6.5)

## 2021-08-02 LAB — VITAMIN D 25 HYDROXY (VIT D DEFICIENCY, FRACTURES): VITD: 39.37 ng/mL (ref 30.00–100.00)

## 2021-08-02 NOTE — Patient Instructions (Signed)

## 2021-08-02 NOTE — Assessment & Plan Note (Signed)
Lab Results  °Component Value Date  ° HGBA1C 5.4 08/02/2021  ° °Stable, pt to continue current medical treatment  - diet °

## 2021-08-02 NOTE — Progress Notes (Signed)
Patient ID: Lauren Gonzalez, female   DOB: 02-16-1986, 35 y.o.   MRN: [id number]         Chief Complaint:: wellness exam        HPI:  Lauren Gonzalez is a 35 y.o. female here for wellness exam; plans to call for GYN appt soon, o/w up to date; declines covid vax.                         Also has hx of RLE DVT,  Recent ankle pain resolved.  Pt denies chest pain, increased sob or doe, wheezing, orthopnea, PND, increased LE swelling, palpitations, dizziness or syncope.   Pt denies polydipsia, polyuria, or new focal neuro s/s.   Pt denies fever, wt loss, night sweats, loss of appetite, or other constitutional symptoms  No other new complants   Wt Readings from Last 3 Encounters:  08/02/21 169 lb (76.7 kg)  07/28/21 174 lb (78.9 kg)  02/06/21 177 lb (80.3 kg)   BP Readings from Last 3 Encounters:  08/02/21 122/78  07/28/21 126/70  02/06/21 139/84   Immunization History  Administered Date(s) Administered   Hepatitis A 08/26/2012   Influenza Split 07/28/2012   Influenza,inj,Quad PF,6+ Mos 07/22/2014, 11/30/2015   PPD Test 07/26/2014, 08/04/2014   Pneumococcal Polysaccharide-23 10/09/2003   Tdap 08/26/2012   There are no preventive care reminders to display for this patient.     Past Medical History:  Diagnosis Date   ADD (attention deficit disorder) 12/29/2013   Allergic rhinitis, cause unspecified    DISORDER, BIPOLAR NEC    DVT (deep venous thrombosis) (HCC)    in leg July 2020 (vaginal ring discontinued after blood clot/started on Xarelto - off now)   Insomnia, unspecified    MIGRAINE HEADACHE    Past Surgical History:  Procedure Laterality Date   CHOLECYSTECTOMY     FRENULECTOMY, LINGUAL     of the base of the tongue   s/p left hip labral tear surgury  dec. 2010   TONSILLECTOMY     TONSILLECTOMY N/A 03/22/2018   Procedure: CAUTERY OF POST TONSILLAR BLEEDING;  Surgeon: Christia Reading, MD;  Location: [facility] OR;  Service: ENT;  Laterality: N/A;   TYMPANOSTOMY TUBE PLACEMENT     as  a child   WISDOM TOOTH EXTRACTION      reports that she has quit smoking. She has never used smokeless tobacco. She reports current alcohol use. She reports current drug use. Drug: Marijuana. family history includes Arthritis in an other family member; Cancer in an other family member; Colon polyps in her father; Depression in an other family member; Diabetes in an other family member; Heart disease in an other family member; Stroke in an other family member. Allergies  Allergen Reactions   Influenza Vaccines Rash   Levaquin [Levofloxacin] Hives and Rash   Current Outpatient Medications on File Prior to Visit  Medication Sig Dispense Refill   albuterol (PROVENTIL HFA;VENTOLIN HFA) 108 (90 Base) MCG/ACT inhaler Inhale 2 puffs into the lungs every 6 (six) hours as needed for wheezing or shortness of breath. 1 Inhaler 0   clonazePAM (KLONOPIN) 1 MG tablet TAKE 1 TABLET(1 MG) BY MOUTH TWICE DAILY AS NEEDED FOR ANXIETY 60 tablet 5   ibuprofen (ADVIL) 800 MG tablet TAKE 1 TABLET(800 MG) BY MOUTH EVERY 6 HOURS AS NEEDED 60 tablet 2   NORLYDA 0.35 MG tablet TK 1 T PO QD     OnabotulinumtoxinA (BOTOX IJ) Botox  Ubrogepant (UBRELVY) 100 MG TABS Take 1 tablet by mouth as needed (May repeat 1 tablet after 2 hours if needed.  Maximum 2 tablets in 24 hours). 16 tablet 3   zolpidem (AMBIEN CR) 12.5 MG CR tablet TAKE 1 TABLET(12.5 MG) BY MOUTH AT BEDTIME AS NEEDED FOR SLEEP 90 tablet 1   No current facility-administered medications on file prior to visit.        ROS:  All others reviewed and negative.  Objective        PE:  BP 122/78 (BP Location: Left Arm, Patient Position: Sitting, Cuff Size: Normal)   Pulse 89   Temp 98.4 F (36.9 C) (Oral)   Ht 5\' 5"  (1.651 m)   Wt 169 lb (76.7 kg)   SpO2 98%   BMI 28.12 kg/m                 Constitutional: Pt appears in NAD               HENT: Head: NCAT.                Right Ear: External ear normal.                 Left Ear: External ear normal.                 Eyes: . Pupils are equal, round, and reactive to light. Conjunctivae and EOM are normal               Nose: without d/c or deformity               Neck: Neck supple. Gross normal ROM               Cardiovascular: Normal rate and regular rhythm.                 Pulmonary/Chest: Effort normal and breath sounds without rales or wheezing.                Abd:  Soft, NT, ND, + BS, no organomegaly               Neurological: Pt is alert. At baseline orientation, motor grossly intact               Skin: Skin is warm. No rashes, no other new lesions, LE edema - trace RLE post DVT               Psychiatric: Pt behavior is normal without agitation   Micro: none  Cardiac tracings I have personally interpreted today:  none  Pertinent Radiological findings (summarize): none   Lab Results  Component Value Date   WBC 7.9 08/02/2021   HGB 14.0 08/02/2021   HCT 42.2 08/02/2021   PLT 309.0 08/02/2021   GLUCOSE 85 08/02/2021   CHOL 200 08/02/2021   TRIG 63.0 08/02/2021   HDL 68.30 08/02/2021   LDLDIRECT 143.0 11/13/2018   LDLCALC 119 (H) 08/02/2021   ALT 14 08/02/2021   AST 15 08/02/2021   NA 136 08/02/2021   K 4.1 08/02/2021   CL 101 08/02/2021   CREATININE 0.81 08/02/2021   BUN 8 08/02/2021   CO2 27 08/02/2021   TSH 2.11 08/02/2021   HGBA1C 5.4 08/02/2021   Assessment/Plan:  Lauren Gonzalez is a 35 y.o. White or Caucasian [1] female with  has a past medical history of ADD (attention deficit disorder) (12/29/2013), Allergic rhinitis, cause unspecified, DISORDER, BIPOLAR NEC, DVT (deep  venous thrombosis) (HCC), Insomnia, unspecified, and MIGRAINE HEADACHE.  Preventative health care Age and sex appropriate education and counseling updated with regular exercise and diet Referrals for preventative services - pt to call for routine GYN appt Immunizations addressed - declines covid vax Smoking counseling  - none needed Evidence for depression or other mood disorder - none  significant Most recent labs reviewed. I have personally reviewed and have noted: 1) the patient's medical and social history 2) The patient's current medications and supplements 3) The patient's height, weight, and BMI have been recorded in the chart   Ankle sprain Resolved,  to f/u any worsening symptoms or concerns  Mixed hyperlipidemia Lab Results  Component Value Date   LDLCALC 119 (H) 08/02/2021   Mild uncontrolled, goal ldl < 100, pt to continue current low chol diet, declines statin   Hyperglycemia Lab Results  Component Value Date   HGBA1C 5.4 08/02/2021   Stable, pt to continue current medical treatment  - diet   Deep venous thrombosis (HCC) D/w pt again results of hypercoagulable panel; 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  Oliver Barre, MD 08/02/2021 9:45 PM [facility] - [facility] Internal Medicine

## 2021-08-02 NOTE — Assessment & Plan Note (Signed)
Lab Results  Component Value Date   LDLCALC 119 (H) 08/02/2021   Mild uncontrolled, goal ldl < 100, pt to continue current low chol diet, declines statin

## 2021-08-02 NOTE — Assessment & Plan Note (Signed)
Age and sex appropriate education and counseling updated with regular exercise and diet Referrals for preventative services - pt to call for routine GYN appt Immunizations addressed - declines covid vax Smoking counseling  - none needed Evidence for depression or other mood disorder - none significant Most recent labs reviewed. I have personally reviewed and have noted: 1) the patient's medical and social history 2) The patient's current medications and supplements 3) The patient's height, weight, and BMI have been recorded in the chart

## 2021-08-02 NOTE — Assessment & Plan Note (Signed)
Resolved,  to f/u any worsening symptoms or concerns  

## 2021-08-02 NOTE — Assessment & Plan Note (Addendum)
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

## 2021-08-04 ENCOUNTER — Ambulatory Visit: Payer: BC Managed Care – PPO | Admitting: Neurology

## 2021-08-04 ENCOUNTER — Other Ambulatory Visit: Payer: Self-pay

## 2021-08-04 DIAGNOSIS — G43709 Chronic migraine without aura, not intractable, without status migrainosus: Secondary | ICD-10-CM | POA: Diagnosis not present

## 2021-08-04 MED ORDER — ONABOTULINUMTOXINA 100 UNITS IJ SOLR
200.0000 [IU] | Freq: Once | INTRAMUSCULAR | Status: AC
Start: 2021-08-04 — End: 2021-08-04
  Administered 2021-08-04: 200 [IU] via INTRAMUSCULAR

## 2021-08-04 NOTE — Progress Notes (Signed)

## 2021-10-20 ENCOUNTER — Other Ambulatory Visit: Payer: Self-pay

## 2021-10-20 MED ORDER — BOTOX 200 UNITS IJ SOLR: INTRAMUSCULAR | 4 refills | Status: DC

## 2021-10-31 ENCOUNTER — Telehealth: Payer: Self-pay

## 2021-10-31 NOTE — Telephone Encounter (Signed)
New message   Call [facility] during the week of 1/9-1/13 to set up a target date for botox delivery on 10/26/21.   I called today to check the status of the Botox delivery never came to the office scheduled on 10/26/21.   Per the representative, I was given misinformation benefit verification was not complete for the patient target date shouldn't have been scheduled.   Per the representative at [facility] will be sending an urgent request around for benefit verification to be checked to allow 24 hours.   Aware the patient has an upcoming appt on  11/03/21.  The fax number is [phone] to send over the approval letter.    I called the patient at  [phone] and left a detailed voicemail at my office extension number [phone] to explain further.

## 2021-11-01 NOTE — Telephone Encounter (Signed)
F/u   Call [facility] for a status update on benefit verification and was told to allow 24 hours. Aware of upcoming appt on  11/03/21.  I called & spoke with the patient at  [phone] and gave her an update from [employer].  I gave the patient [facility]'s phone [phone]

## 2021-11-03 ENCOUNTER — Ambulatory Visit: Payer: BC Managed Care – PPO | Admitting: Neurology

## 2021-11-09 ENCOUNTER — Telehealth: Payer: Self-pay

## 2021-11-09 NOTE — Telephone Encounter (Signed)
Patient stated she will be here tomorrow at 11:50am to get the cocktail, that's the only time she will have a driver. She got off the phone with [facility] and they stated our office didn't send in [place], but she knows we did

## 2021-11-09 NOTE — Telephone Encounter (Signed)
LMOVM for pt, Per Dr.jaffe pt okay to come by and get a headache cocktail. Pt have a driver.

## 2021-11-09 NOTE — Telephone Encounter (Signed)
F/u  Call [place] spoke with [facility] informed me the benefit was not marked as an urgent request when I called in January.   Monique suggested I call back this afternoon for a status update, aware the patient has an appt on 11/10/21.  I called the patient and left a detailed voicemail to call me at [phone].

## 2021-11-09 NOTE — Telephone Encounter (Signed)
New message   The patient called me back and was informed of a status update from [facility].   C/o   Patient was out of work yesterday   Headache for the past two days and took all my medication.   Please advise on the next steps

## 2021-11-09 NOTE — Telephone Encounter (Signed)
Botox delivered. LMOVM for pt.

## 2021-11-10 ENCOUNTER — Ambulatory Visit (INDEPENDENT_AMBULATORY_CARE_PROVIDER_SITE_OTHER): Payer: BC Managed Care – PPO

## 2021-11-10 ENCOUNTER — Telehealth: Payer: Self-pay

## 2021-11-10 ENCOUNTER — Other Ambulatory Visit: Payer: Self-pay

## 2021-11-10 ENCOUNTER — Ambulatory Visit: Payer: BC Managed Care – PPO | Admitting: Neurology

## 2021-11-10 DIAGNOSIS — G43709 Chronic migraine without aura, not intractable, without status migrainosus: Secondary | ICD-10-CM | POA: Diagnosis not present

## 2021-11-10 MED ORDER — DIPHENHYDRAMINE HCL 50 MG/ML IJ SOLN
25.0000 mg | Freq: Once | INTRAMUSCULAR | Status: AC
  Administered 2021-11-10: 25 mg via INTRAMUSCULAR

## 2021-11-10 MED ORDER — METOCLOPRAMIDE HCL 5 MG/ML IJ SOLN
10.0000 mg | Freq: Once | INTRAMUSCULAR | Status: AC
  Administered 2021-11-10: 10 mg via INTRAMUSCULAR

## 2021-11-10 MED ORDER — KETOROLAC TROMETHAMINE 60 MG/2ML IM SOLN
60.0000 mg | Freq: Once | INTRAMUSCULAR | Status: AC
  Administered 2021-11-10: 60 mg via INTRAMUSCULAR

## 2021-11-10 NOTE — Telephone Encounter (Signed)
Tried calling pt, No answer. Botox delivery delayed per fax received from [facility].    Pt can talk to DR.Jaffe and get her headache cocktail.

## 2021-11-13 DIAGNOSIS — G43709 Chronic migraine without aura, not intractable, without status migrainosus: Secondary | ICD-10-CM | POA: Diagnosis not present

## 2021-11-16 ENCOUNTER — Ambulatory Visit: Payer: BC Managed Care – PPO | Admitting: Neurology

## 2021-11-16 ENCOUNTER — Other Ambulatory Visit: Payer: Self-pay

## 2021-11-16 DIAGNOSIS — G43709 Chronic migraine without aura, not intractable, without status migrainosus: Secondary | ICD-10-CM

## 2021-11-16 MED ORDER — ONABOTULINUMTOXINA 100 UNITS IJ SOLR
200.0000 [IU] | Freq: Once | INTRAMUSCULAR | Status: AC
  Administered 2021-11-16: 155 [IU] via INTRAMUSCULAR

## 2021-11-16 NOTE — Progress Notes (Signed)

## 2022-02-06 ENCOUNTER — Telehealth (HOSPITAL_COMMUNITY): Payer: Self-pay | Admitting: Pharmacy Technician

## 2022-02-06 NOTE — Telephone Encounter (Signed)
Patient Advocate Encounter ?  ?Received notification that prior authorization for Botox 200UNIT solution is required. ?  ?PA submitted on 02/06/2022 ?Key [id number] ?Status is pending ?   ? ? ? ?Lyndel Safe, CPhT ?Pharmacy Patient Advocate Specialist ?[place] Patient Advocate Team ?Direct Number: [phone]  Fax: [phone]  ?

## 2022-02-06 NOTE — Telephone Encounter (Signed)
-----   Message from Venetia Night, [place] sent at 02/06/2022 11:18 AM EDT ----- ?Regarding: PA Check ?I spoke to the pharmacy, Need [employer] PA check maybe due.If it need to be renewed please start process.  Patient has an appt 02/23/22. ? ?sheena ? ?

## 2022-02-07 NOTE — Telephone Encounter (Signed)
Patient Advocate Encounter ? ?Prior Authorization for Botox 200UNIT solution ? has been approved.   ? ? ?Effective dates: 02/06/2022 through 01/07/2023 ? ? ? ? ? ?Roland Earl, CPhT ?Pharmacy Patient Advocate Specialist ?[facility] Patient Advocate Team ?Direct Number: [phone]  Fax: [phone]  ?

## 2022-02-13 DIAGNOSIS — G43709 Chronic migraine without aura, not intractable, without status migrainosus: Secondary | ICD-10-CM | POA: Diagnosis not present

## 2022-02-23 ENCOUNTER — Ambulatory Visit: Payer: BC Managed Care – PPO | Admitting: Neurology

## 2022-02-23 DIAGNOSIS — G43009 Migraine without aura, not intractable, without status migrainosus: Secondary | ICD-10-CM | POA: Diagnosis not present

## 2022-02-23 MED ORDER — ONABOTULINUMTOXINA 100 UNITS IJ SOLR
200.0000 [IU] | Freq: Once | INTRAMUSCULAR | Status: AC
  Administered 2022-02-23: 155 [IU] via INTRAMUSCULAR

## 2022-02-23 NOTE — Progress Notes (Signed)

## 2022-03-05 NOTE — Progress Notes (Unsigned)
Virtual Visit via Video Note The purpose of this virtual visit is to provide medical care while limiting exposure to the novel coronavirus.    Consent was obtained for video visit:  Yes.   Answered questions that patient had about telehealth interaction:  Yes.   I discussed the limitations, risks, security and privacy concerns of performing an evaluation and management service by telemedicine. I also discussed with the patient that there may be a patient responsible charge related to this service. The patient expressed understanding and agreed to proceed.  Pt location: Home Physician Location: office Name of referring provider:  Biagio Borg, MD I connected with Lauren Gonzalez at patients initiation/request on 03/06/2022 at  8:50 AM EDT by video enabled telemedicine application and verified that I am speaking with the correct person using two identifiers. Pt MRN:  [id number] Pt DOB:  [birth year] Video Participants:  Lauren Gonzalez   Assessment/Plan:   Chronic Migraine without aura, without status migrainosus, not intractable   1.  Migraine prevention:  Botox 2.  Migraine rescue:  Ubrelvy 100mg  3.  Limit use of pain relievers to no more than 2 days out of week to prevent risk of rebound or medication-overuse headache. 4.  Keep headache diary 5.  Follow up for next Botox   Subjective:  Lauren Gonzalez is a 36 year old female with history of DVT who follows up for migraines.   UPDATE: Intensity:  Mild (rarely severe) Duration:  couple of hours to a day Frequency:  3 in 3 months (usually within the last month prior to Botox). May still have some slight nausea and eyes feel tired. Able to function.   She previously talked about increased jaw stiffness - difficulty opening mouth when eating.  Last botox I raised where I injected one of the temporal injections and has noted improvement.  Still with mild difficulty. Rescue therapy: Ubrelvy.  Current NSAIDS:  ibuprofen Current analgesics:   none Current triptans:  none Current ergotamine:  none Current anti-emetic:  Phenergan PR Current muscle relaxants:  Cyclobenzaprine 5mg  Current anti-anxiolytic:  Clonazepam 1mg  twice daily PRN Current sleep aide:  Ambien Current Antihypertensive medications:  none Current Antidepressant medications:  none Current Anticonvulsant medications:  none Current anti-CGRP:  Ubrelvy Current Vitamins/Herbal/Supplements:  none Current Antihistamines/Decongestants:  none Other therapy:  Botox Hormone/birth control:  Norlyda (progesterone)   Caffeine:  1 cup coffee a week.  Energy drink infrequently.  Occasional Pepsi Diet:  At least 64 oz water.  Occasional soda (Sprite, orange soda) Exercise:  Not routine Depression and Anxiety.  Has Bipolar disorder.  Currently stable. Other pain:  no Sleep hygiene:  Good when she is not working.  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   HISTORY:  Onset:  Since highschool.  Worse over past year, worse related to menstrual cycle. Location:  Starts  Unilateral (either side) and the becomes bilatera, frontal, face, temples, radiates to back of head Quality:  Starts as pressure and becomes pounding/throbbing Initial intensity:  Severe.  She denies new headache, thunderclap headache or severe headache that wakes her from sleep. Aura:  none Premonitory Phase:  Feels slightly "off"  Postdrome:  Tired Associated symptoms:  Nausea, vomiting, photophobia, phonophobia, osmophobia, hair and teeth hurt.  She denies associated unilateral numbness or weakness. Initial duration:  All day (sometimes in to next day) Initial frequency:  Mild-moderate migraine just before her period.  Severe during her period, may have one other one outside  period.  (total 4 days a month) Initial frequency of abortive medication: 4 days a month Triggers:  Menses, weather changes, certain smells, emotional stress, caffeine-withdrawal, sleep deprivation) Relieving  factors:  Coolness, dark place Activity: aggravates   MRI of brain with and without contrat from 11/09/04 personally reviewed and was normal.     Past NSAIDS:  Cambia, naproxen Past analgesics:  Excedrin, BC powder; Tylenol #3 Past abortive triptans:  Sumatriptan 100mg , Zomig 5mg  NS (effective but felt in a daze all day), Frova 2.5mg  (helpful but expensive) Past abortive ergotamine:  none Past muscle relaxants:  Robaxin Past anti-emetic:  promethazine Past antihypertensive medications:  none Past antidepressant/antipsychotic medications:  Sertraline, Vyanse Past anticonvulsant medications:  topiramate 50mg  twice daily (paresthesias) Past anti-CGRP:  Aimovig (caused elevated blood pressure) Past vitamins/Herbal/Supplements:  Magnesium, CoQ10 Past antihistamines/decongestants:  Zyrtec Other past therapies:  Changing birth control from [place] to progesterone, trigger point injections/nerve blocks     Family history of headache:  Dad (migraines, resolved)  Past Medical History: Past Medical History:  Diagnosis Date   ADD (attention deficit disorder) 12/29/2013   Allergic rhinitis, cause unspecified    DISORDER, BIPOLAR NEC    DVT (deep venous thrombosis) ([place])    in leg July 2020 (vaginal ring discontinued after blood clot/started on Xarelto - off now)   Insomnia, unspecified    MIGRAINE HEADACHE     Medications: Outpatient Encounter Medications as of 03/06/2022  Medication Sig   albuterol (PROVENTIL HFA;VENTOLIN HFA) 108 (90 Base) MCG/ACT inhaler Inhale 2 puffs into the lungs every 6 (six) hours as needed for wheezing or shortness of breath.   Botulinum Toxin Type A (BOTOX) 200 units SOLR Inject 155 units IM into multiple site in the face,neck and head once every 90 days   ibuprofen (ADVIL) 800 MG tablet TAKE 1 TABLET(800 MG) BY MOUTH EVERY 6 HOURS AS NEEDED   NORLYDA 0.35 MG tablet TK 1 T PO QD   Ubrogepant (UBRELVY) 100 MG TABS Take 1 tablet by mouth as needed (May repeat 1  tablet after 2 hours if needed.  Maximum 2 tablets in 24 hours).   zolpidem (AMBIEN CR) 12.5 MG CR tablet TAKE 1 TABLET(12.5 MG) BY MOUTH AT BEDTIME AS NEEDED FOR SLEEP   [DISCONTINUED] clonazePAM (KLONOPIN) 1 MG tablet TAKE 1 TABLET(1 MG) BY MOUTH TWICE DAILY AS NEEDED FOR ANXIETY   No facility-administered encounter medications on file as of 03/06/2022.    Allergies: Allergies  Allergen Reactions   Influenza Vaccines Rash   Levaquin [Levofloxacin] Hives and Rash    Family History: Family History  Problem Relation Age of Onset   Colon polyps Father    Depression Other    Diabetes Other    Stroke Other    Heart disease Other    Cancer Other        lung and ovarian cancer   Arthritis Other     Observations/Objective:   Height 5\' 5"  (1.651 m), weight 160 lb (72.6 kg). No acute distress.  Alert and oriented.  Speech fluent and not dysarthric.  Language intact.  Eyes orthophoric on primary gaze.  Face symmetric.   Follow Up Instructions:    -I discussed the assessment and treatment plan with the patient. The patient was provided an opportunity to ask questions and all were answered. The patient agreed with the plan and demonstrated an understanding of the instructions.   The patient was advised to call back or seek an in-person evaluation if the symptoms worsen or if  the condition fails to improve as anticipated.   Dudley Major, DO

## 2022-03-06 ENCOUNTER — Encounter: Payer: Self-pay | Admitting: Neurology

## 2022-03-06 ENCOUNTER — Telehealth: Payer: BC Managed Care – PPO | Admitting: Neurology

## 2022-03-06 VITALS — Ht 65.0 in | Wt 160.0 lb

## 2022-03-06 DIAGNOSIS — G43009 Migraine without aura, not intractable, without status migrainosus: Secondary | ICD-10-CM

## 2022-05-10 NOTE — Telephone Encounter (Signed)
Per Pharmacy need the new PA faxed over before the botox can be delivered. [phone]

## 2022-05-11 NOTE — Telephone Encounter (Signed)
Spoke to [employer], will fax over the letter in the next 10-15 minutes.

## 2022-05-14 NOTE — Telephone Encounter (Signed)
[facility]ed [facility] and they claimed they did not receive letter on Friday for PA approval so I refaxed to [facility]

## 2022-05-14 NOTE — Telephone Encounter (Signed)
Pa approval letter per rep not received yet. It could take a while to get.  Tried to advise rep that the first time we fax the letter was 02/2022,  the second time was last Thursday when I called and the third was this morning why haven't the letter been received not what the process is of the approval.  Patient has an appt on 05/25/22.

## 2022-05-16 NOTE — Telephone Encounter (Signed)
PA investigation in process, Sent urgent to make sure we can have delivery 05/22/22.

## 2022-05-18 NOTE — Telephone Encounter (Signed)
Spoke to [place] at [employer] delivery is still pending PA investigation. Rep added that patient needs to be done and on time for the patient appt on the 18th.

## 2022-05-23 DIAGNOSIS — G43709 Chronic migraine without aura, not intractable, without status migrainosus: Secondary | ICD-10-CM | POA: Diagnosis not present

## 2022-05-25 ENCOUNTER — Ambulatory Visit: Payer: BC Managed Care – PPO | Admitting: Neurology

## 2022-05-25 DIAGNOSIS — G43009 Migraine without aura, not intractable, without status migrainosus: Secondary | ICD-10-CM

## 2022-05-25 MED ORDER — ONABOTULINUMTOXINA 200 UNITS IJ SOLR
200.0000 [IU] | Freq: Once | INTRAMUSCULAR | Status: AC
  Administered 2022-05-25: 155 [IU] via INTRAMUSCULAR

## 2022-05-25 NOTE — Progress Notes (Signed)

## 2022-07-06 ENCOUNTER — Other Ambulatory Visit (HOSPITAL_COMMUNITY): Payer: Self-pay

## 2022-07-06 ENCOUNTER — Telehealth: Payer: Self-pay

## 2022-07-06 NOTE — Telephone Encounter (Signed)
PA renewal initiated automatically by CoverMyMeds.  Submitted a Prior Authorization request to [place] for Ubrelvy 100MG  tablets via CoverMyMeds. Will update once we receive a response.   Key: Alex Gardener

## 2022-07-09 ENCOUNTER — Other Ambulatory Visit (HOSPITAL_COMMUNITY): Payer: Self-pay

## 2022-07-09 NOTE — Telephone Encounter (Signed)
Patient Advocate Encounter  Prior Authorization for Lauren Gonzalez 100MG  tablets has been approved.     Effective: 07-06-2022 to 07-06-2023

## 2022-08-22 DIAGNOSIS — G43709 Chronic migraine without aura, not intractable, without status migrainosus: Secondary | ICD-10-CM | POA: Diagnosis not present

## 2022-08-24 ENCOUNTER — Ambulatory Visit: Payer: BC Managed Care – PPO | Admitting: Neurology

## 2022-08-24 DIAGNOSIS — G43709 Chronic migraine without aura, not intractable, without status migrainosus: Secondary | ICD-10-CM

## 2022-08-24 MED ORDER — ONABOTULINUMTOXINA 100 UNITS IJ SOLR
200.0000 [IU] | Freq: Once | INTRAMUSCULAR | Status: AC
  Administered 2022-08-24: 155 [IU] via INTRAMUSCULAR

## 2022-08-24 NOTE — Progress Notes (Signed)

## 2022-10-04 ENCOUNTER — Encounter: Payer: Self-pay | Admitting: Nurse Practitioner

## 2022-10-04 ENCOUNTER — Ambulatory Visit (INDEPENDENT_AMBULATORY_CARE_PROVIDER_SITE_OTHER): Payer: BC Managed Care – PPO | Admitting: Nurse Practitioner

## 2022-10-04 VITALS — BP 130/82 | HR 68 | Temp 99.1°F | Ht 65.0 in | Wt 180.2 lb

## 2022-10-04 DIAGNOSIS — K529 Noninfective gastroenteritis and colitis, unspecified: Secondary | ICD-10-CM

## 2022-10-04 LAB — POCT INFLUENZA A/B
Influenza A, POC: NEGATIVE
Influenza B, POC: NEGATIVE

## 2022-10-04 LAB — POC COVID19 BINAXNOW: SARS Coronavirus 2 Ag: NEGATIVE

## 2022-10-04 NOTE — Progress Notes (Signed)
Established Patient Office Visit  Subjective   Patient ID: Lauren Gonzalez, female    DOB: Sep 27, 1986  Age: 36 y.o. MRN: [id number]  Chief Complaint  Patient presents with   Fever    Symptom onset 4 days ago, symptoms lasted for approximately 24 to 36 hours.  She is experiencing abdominal pain (cramping), nausea with vomiting, and diarrhea.  Overall symptoms have now subsided.  She reports that she is here because she is required to have a work note for her absence on 09/30/2022.  Is now able to drink liquids and eat solid food without vomiting or diarrhea.    Review of Systems  Constitutional:  Positive for fever.  Respiratory:  Positive for cough. Negative for shortness of breath.   Cardiovascular:  Negative for chest pain.  Gastrointestinal:  Negative for abdominal pain, diarrhea, nausea and vomiting.      Objective:     BP 130/82   Pulse 68   Temp 99.1 F (37.3 C) Comment: oral  Ht 5\' 5"  (1.651 m)   Wt 180 lb 4 oz (81.8 kg)   SpO2 98%   BMI 30.00 kg/m    Physical Exam Vitals reviewed.  Constitutional:      General: She is not in acute distress.    Appearance: Normal appearance.  HENT:     Head: Normocephalic and atraumatic.  Neck:     Vascular: No carotid bruit.  Cardiovascular:     Rate and Rhythm: Normal rate and regular rhythm.     Pulses: Normal pulses.     Heart sounds: Normal heart sounds.  Pulmonary:     Effort: Pulmonary effort is normal.     Breath sounds: Normal breath sounds.  Abdominal:     General: Bowel sounds are increased.     Tenderness: There is no abdominal tenderness.  Skin:    General: Skin is warm and dry.  Neurological:     General: No focal deficit present.     Mental Status: She is alert and oriented to person, place, and time.  Psychiatric:        Mood and Affect: Mood normal.        Behavior: Behavior normal.        Judgment: Judgment normal.      Results for orders placed or performed in visit on 10/04/22  POC  COVID-19  Result Value Ref Range   SARS Coronavirus 2 Ag Negative Negative  POCT Influenza A/B  Result Value Ref Range   Influenza A, POC Negative Negative   Influenza B, POC Negative Negative      The ASCVD Risk score (Arnett DK, et al., 2019) failed to calculate for the following reasons:   The 2019 ASCVD risk score is only valid for ages 52 to 3    Assessment & Plan:   Problem List Items Addressed This Visit       Digestive   Gastroenteritis - Primary    Acute, seems to be resolving.  Point-of-care COVID and flu testing negative today.  Initially patient's temperature is quite elevated upon rooming at 102.9, patient reported feeling well and did not feel as if she was running a fever nor did she have chills.  Patient removed sweatshirt waited for a few minutes and recheck showed oral temperature of 99.1.  Patient denies history of seizures.  Recommend patient treat with Tylenol at home as needed.  Patient encouraged to stay out of work until she has been fever free for at least  24 hours.  As long as temperature remains below 100.4 she can return to work as scheduled tomorrow without restrictions.  Patient encouraged to let our office know if symptoms return or persist.  Patient reports understanding.      Relevant Orders   POC COVID-19 (Completed)   POCT Influenza A/B (Completed)    Return if symptoms worsen or fail to improve.    Elenore Paddy, NP

## 2022-10-04 NOTE — Assessment & Plan Note (Signed)
Acute, seems to be resolving.  Point-of-care COVID and flu testing negative today.  Initially patient's temperature is quite elevated upon rooming at 102.9, patient reported feeling well and did not feel as if she was running a fever nor did she have chills.  Patient removed sweatshirt waited for a few minutes and recheck showed oral temperature of 99.1.  Patient denies history of seizures.  Recommend patient treat with Tylenol at home as needed.  Patient encouraged to stay out of work until she has been fever free for at least 24 hours.  As long as temperature remains below 100.4 she can return to work as scheduled tomorrow without restrictions.  Patient encouraged to let our office know if symptoms return or persist.  Patient reports understanding.

## 2022-10-19 ENCOUNTER — Ambulatory Visit: Payer: BC Managed Care – PPO | Admitting: Neurology

## 2022-11-05 ENCOUNTER — Telehealth: Payer: Self-pay

## 2022-11-05 NOTE — Telephone Encounter (Signed)
New script needed per [place]. New Botox 200 units script given verbally.

## 2022-11-19 DIAGNOSIS — G43709 Chronic migraine without aura, not intractable, without status migrainosus: Secondary | ICD-10-CM | POA: Diagnosis not present

## 2022-11-23 ENCOUNTER — Ambulatory Visit: Payer: BC Managed Care – PPO | Admitting: Neurology

## 2022-11-23 DIAGNOSIS — G43709 Chronic migraine without aura, not intractable, without status migrainosus: Secondary | ICD-10-CM

## 2022-11-23 MED ORDER — ONABOTULINUMTOXINA 100 UNITS IJ SOLR
200.0000 [IU] | Freq: Once | INTRAMUSCULAR | Status: AC
  Administered 2022-11-23: 155 [IU] via INTRAMUSCULAR

## 2022-11-23 NOTE — Progress Notes (Signed)
[facility]   Procedure Note Botox  Attending: Dr. Metta Clines  Preoperative Diagnosis(es): Chronic migraine  Consent obtained from: patient Benefits discussed included, but were not limited to decreased muscle tightness, increased joint range of motion, and decreased pain.  Risk discussed included, but were not limited pain and discomfort, bleeding, bruising, excessive weakness, venous thrombosis, muscle atrophy and dysphagia.  Anticipated outcomes of the procedure as well as he risks and benefits of the alternatives to the procedure, and the roles and tasks of the personnel to be involved, were discussed with the patient, and the patient consents to the procedure and agrees to proceed. A copy of the patient medication guide was given to the patient which explains the blackbox warning.  Patients identity and treatment sites confirmed Yes.  .  Details of Procedure: Skin was cleaned with alcohol. Prior to injection, the needle plunger was aspirated to make sure the needle was not within a blood vessel.  There was no blood retrieved on aspiration.    Following is a summary of the muscles injected  And the amount of Botulinum toxin used:  Dilution 200 units of Botox was reconstituted with 4 ml of preservative free normal saline. Time of reconstitution: At the time of the office visit (<30 minutes prior to injection)   Injections  155 total units of Botox was injected with a 30 gauge needle.  Injection Sites: L occipitalis: 15 units- 3 sites  R occiptalis: 15 units- 3 sites  L upper trapezius: 15 units- 3 sites R upper trapezius: 15 units- 3 sits          L paraspinal: 10 units- 2 sites R paraspinal: 10 units- 2 sites  Face L frontalis(2 injection sites):10 units   R frontalis(2 injection sites):10 units         L corrugator: 5 units   R corrugator: 5 units           Procerus: 5 units   L temporalis: 20 units R temporalis: 20 units   Agent:  200 units of botulinum Type A  (Onobotulinum Toxin type A) was reconstituted with 4 ml of preservative free normal saline.  Time of reconstitution: At the time of the office visit (<30 minutes prior to injection)     Total injected (Units):  155  Total wasted (Units):  45  Patient tolerated procedure well without complications.   Reinjection is anticipated in 3 months.

## 2023-01-09 ENCOUNTER — Ambulatory Visit (INDEPENDENT_AMBULATORY_CARE_PROVIDER_SITE_OTHER): Payer: BC Managed Care – PPO | Admitting: Internal Medicine

## 2023-01-09 ENCOUNTER — Encounter: Payer: Self-pay | Admitting: Internal Medicine

## 2023-01-09 VITALS — BP 118/84 | HR 90 | Temp 98.2°F | Ht 65.0 in | Wt 181.0 lb

## 2023-01-09 DIAGNOSIS — R739 Hyperglycemia, unspecified: Secondary | ICD-10-CM

## 2023-01-09 DIAGNOSIS — R87612 Low grade squamous intraepithelial lesion on cytologic smear of cervix (LGSIL): Secondary | ICD-10-CM | POA: Insufficient documentation

## 2023-01-09 DIAGNOSIS — T783XXA Angioneurotic edema, initial encounter: Secondary | ICD-10-CM

## 2023-01-09 DIAGNOSIS — D649 Anemia, unspecified: Secondary | ICD-10-CM | POA: Insufficient documentation

## 2023-01-09 DIAGNOSIS — F419 Anxiety disorder, unspecified: Secondary | ICD-10-CM | POA: Diagnosis not present

## 2023-01-09 DIAGNOSIS — N926 Irregular menstruation, unspecified: Secondary | ICD-10-CM | POA: Insufficient documentation

## 2023-01-09 DIAGNOSIS — R319 Hematuria, unspecified: Secondary | ICD-10-CM | POA: Insufficient documentation

## 2023-01-09 MED ORDER — PREDNISONE 10 MG PO TABS
ORAL_TABLET | ORAL | 0 refills | Status: DC
Start: 2023-01-09 — End: 2023-02-14

## 2023-01-09 NOTE — Progress Notes (Signed)
Patient ID: Lauren Gonzalez, female   DOB: [birth year], 37 y.o.   MRN: [id number]        Chief Complaint: follow up facial swelling x 2 days       HPI:  Lauren Gonzalez is a 37 y.o. female here with mention of Lauren Gonzalez outdoor cookout , ate some plums she does not usually eat and other foods, and unfortunately a few hours later with onset marked facial and periorbital swelling, itching, maybe mild throat sweling.  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   Pt denies polydipsia, polyuria, or new focal neuro s/s.    Pt denies fever, wt loss, night sweats, loss of appetite, or other constitutional symptoms  Denies worsening depressive symptoms, suicidal ideation, or panic       Wt Readings from Last 3 Encounters:  01/09/23 181 lb (82.1 kg)  10/04/22 180 lb 4 oz (81.8 kg)  03/06/22 160 lb (72.6 kg)   BP Readings from Last 3 Encounters:  01/09/23 118/84  10/04/22 130/82  08/02/21 122/78         Past Medical History:  Diagnosis Date   ADD (attention deficit disorder) 12/29/2013   Allergic rhinitis, cause unspecified    DISORDER, BIPOLAR NEC    DVT (deep venous thrombosis)    in leg July 2020 (vaginal ring discontinued after blood clot/started on Xarelto - off now)   Insomnia, unspecified    MIGRAINE HEADACHE    Past Surgical History:  Procedure Laterality Date   CHOLECYSTECTOMY     FRENULECTOMY, LINGUAL     of the base of the tongue   s/p left hip labral tear surgury  dec. 2010   TONSILLECTOMY     TONSILLECTOMY N/A 03/22/2018   Procedure: CAUTERY OF POST TONSILLAR BLEEDING;  Surgeon: Lauren Gonzalez, Dwight, Lauren Gonzalez;  Location: [facility] OR;  Service: ENT;  Laterality: N/A;   TYMPANOSTOMY TUBE PLACEMENT     as a child   WISDOM TOOTH EXTRACTION      reports that she has quit smoking. She has never used  smokeless tobacco. She reports that she does not currently use alcohol. She reports current drug use. Drug: Marijuana. family history includes Arthritis in an other family member; Cancer in an other family member; Colon polyps in her father; Depression in an other family member; Diabetes in an other family member; Heart disease in an other family member; Stroke in an other family member. Allergies  Allergen Reactions   Influenza Vaccines Rash    Rash and swelling/redness in area of injection.   Levaquin [Levofloxacin] Hives and Rash   Current Outpatient Medications on File Prior to Visit  Medication Sig Dispense Refill   Botulinum Toxin Type A (BOTOX) 200 units SOLR Inject 155 units IM into multiple site in the face,neck and head once every 90 days 1 each 4   Collagen-Vitamin C-Biotin (COLLAGEN 1500/C) 500-50-0.8 MG CAPS Take by mouth.     ibuprofen (ADVIL) 800 MG tablet TAKE 1 TABLET(800 MG) BY MOUTH EVERY 6 HOURS AS NEEDED 60 tablet 2   Multiple Vitamin (MULTIVITAMIN) capsule Take 1 capsule by mouth daily.     albuterol (PROVENTIL HFA;VENTOLIN HFA) 108 (90 Base) MCG/ACT inhaler Inhale 2 puffs into the lungs every 6 (six) hours as needed for wheezing or shortness of breath. (Patient not taking: Reported  on 01/09/2023) 1 Inhaler 0   Ubrogepant (UBRELVY) 100 MG TABS Take 1 tablet by mouth as needed (May repeat 1 tablet after 2 hours if needed.  Maximum 2 tablets in 24 hours). (Patient not taking: Reported on 01/09/2023) 16 tablet 3   No current facility-administered medications on file prior to visit.        ROS:  All others reviewed and negative.  Objective        PE:  BP 118/84 (BP Location: Left Arm, Patient Position: Sitting, Cuff Size: Normal)   Pulse 90   Temp 98.2 F (36.8 C) (Oral)   Ht 5\' 5"  (1.651 m)   Wt 181 lb (82.1 kg)   SpO2 96%   BMI 30.12 kg/m                 Constitutional: Pt appears in NAD               HENT: Head: NCAT.                Right Ear: External ear normal.                  Left Ear: External ear normal.                Eyes: . Pupils are equal, round, and reactive to light. Conjunctivae and EOM are normal               Nose: without d/c or deformity               Neck: Neck supple. Gross normal ROM               Cardiovascular: Normal rate and regular rhythm.                 Pulmonary/Chest: Effort normal and breath sounds without rales or wheezing.                Abd:  Soft, NT, ND, + BS, no organomegaly               Neurological: Pt is alert. At baseline orientation, motor grossly intact               Skin: Skin is warm. No rashes, no other new lesions, LE edema - none               Psychiatric: Pt behavior is normal without agitation   Micro: none  Cardiac tracings I have personally interpreted today:  none  Pertinent Radiological findings (summarize): none   Lab Results  Component Value Date   WBC 7.9 08/02/2021   HGB 14.0 08/02/2021   HCT 42.2 08/02/2021   PLT 309.0 08/02/2021   GLUCOSE 85 08/02/2021   CHOL 200 08/02/2021   TRIG 63.0 08/02/2021   HDL 68.30 08/02/2021   LDLDIRECT 143.0 11/13/2018   LDLCALC 119 (H) 08/02/2021   ALT 14 08/02/2021   AST 15 08/02/2021   NA 136 08/02/2021   K 4.1 08/02/2021   CL 101 08/02/2021   CREATININE 0.81 08/02/2021   BUN 8 08/02/2021   CO2 27 08/02/2021   TSH 2.11 08/02/2021   HGBA1C 5.4 08/02/2021   Assessment/Plan:  Lauren Gonzalez is a 37 y.o. White or Caucasian [1] female with  has a past medical history of ADD (attention deficit disorder) (12/29/2013), Allergic rhinitis, cause unspecified, DISORDER, BIPOLAR NEC, DVT (deep venous thrombosis), Insomnia, unspecified, and MIGRAINE HEADACHE.  Angioedema Recent onset facial and periorbital  mod to severe by hx, now much improved with benadryl but still some swelling persists with itching, for prednisone taper, cont benadryl prn,  to f/u any worsening symptoms or concerns  Hyperglycemia Lab Results  Component Value Date   HGBA1C 5.4  08/02/2021   Stable, pt to continue current medical treatment  - diet, wt control   Anxiety Mild situational worsening, pt reassured, pt declines need for med tx change,  to f/u any worsening symptoms or concerns  Followup: Return if symptoms worsen or fail to improve.  Oliver BarreJames , Lauren Gonzalez 01/12/2023 9:28 AM [facility]

## 2023-01-09 NOTE — Patient Instructions (Signed)
Please take all new medication as prescribed  - the prednisone  You can also continue the OTC antihistamine such as zyrtec or benadryl  Please continue all other medications as before, and refills have been done if requested.  Please have the pharmacy call with any other refills you may need.  Please keep your appointments with your specialists as you may have planned

## 2023-01-12 ENCOUNTER — Encounter: Payer: Self-pay | Admitting: Internal Medicine

## 2023-01-12 NOTE — Assessment & Plan Note (Signed)
Lab Results  Component Value Date   HGBA1C 5.4 08/02/2021   Stable, pt to continue current medical treatment  - diet, wt control

## 2023-01-12 NOTE — Assessment & Plan Note (Signed)
Recent onset facial and periorbital mod to severe by hx, now much improved with benadryl but still some swelling persists with itching, for prednisone taper, cont benadryl prn,  to f/u any worsening symptoms or concerns

## 2023-01-12 NOTE — Assessment & Plan Note (Signed)
Mild situational worsening, pt reassured, pt declines need for med tx change,  to f/u any worsening symptoms or concerns

## 2023-02-11 ENCOUNTER — Other Ambulatory Visit: Payer: Self-pay

## 2023-02-11 MED ORDER — BOTOX 200 UNITS IJ SOLR: INTRAMUSCULAR | 4 refills | Status: DC

## 2023-02-13 ENCOUNTER — Telehealth: Payer: Self-pay | Admitting: Pharmacy Technician

## 2023-02-13 ENCOUNTER — Other Ambulatory Visit (HOSPITAL_COMMUNITY): Payer: Self-pay

## 2023-02-13 NOTE — Telephone Encounter (Signed)
Botox[employer] verification has been submitted. Benefit Verification:   [id number]  Pharmacy PA has been submitted for BOTOX 200 via [employer]. INSURANCE: [employer] DATE SUBMITTED: 5.8.24 KEY: [id number] Status is pending

## 2023-02-14 ENCOUNTER — Telehealth: Payer: Self-pay | Admitting: Internal Medicine

## 2023-02-14 ENCOUNTER — Ambulatory Visit (INDEPENDENT_AMBULATORY_CARE_PROVIDER_SITE_OTHER): Payer: BC Managed Care – PPO | Admitting: Internal Medicine

## 2023-02-14 ENCOUNTER — Encounter: Payer: Self-pay | Admitting: Internal Medicine

## 2023-02-14 VITALS — BP 126/78 | HR 84 | Temp 98.6°F | Ht 65.0 in | Wt 188.0 lb

## 2023-02-14 DIAGNOSIS — J309 Allergic rhinitis, unspecified: Secondary | ICD-10-CM | POA: Diagnosis not present

## 2023-02-14 DIAGNOSIS — E782 Mixed hyperlipidemia: Secondary | ICD-10-CM

## 2023-02-14 DIAGNOSIS — R739 Hyperglycemia, unspecified: Secondary | ICD-10-CM

## 2023-02-14 DIAGNOSIS — T7840XA Allergy, unspecified, initial encounter: Secondary | ICD-10-CM

## 2023-02-14 DIAGNOSIS — E559 Vitamin D deficiency, unspecified: Secondary | ICD-10-CM

## 2023-02-14 DIAGNOSIS — E538 Deficiency of other specified B group vitamins: Secondary | ICD-10-CM

## 2023-02-14 DIAGNOSIS — T7840XD Allergy, unspecified, subsequent encounter: Secondary | ICD-10-CM

## 2023-02-14 DIAGNOSIS — E611 Iron deficiency: Secondary | ICD-10-CM

## 2023-02-14 DIAGNOSIS — F3189 Other bipolar disorder: Secondary | ICD-10-CM | POA: Diagnosis not present

## 2023-02-14 MED ORDER — PREDNISONE 10 MG PO TABS: ORAL_TABLET | ORAL | 1 refills | Status: DC

## 2023-02-14 MED ORDER — METHYLPREDNISOLONE ACETATE 80 MG/ML IJ SUSP
80.0000 mg | Freq: Once | INTRAMUSCULAR | Status: AC
Start: 2023-02-14 — End: 2023-02-14
  Administered 2023-02-14: 80 mg via INTRAMUSCULAR

## 2023-02-14 NOTE — Telephone Encounter (Signed)
Patient would like to do her labs before her physical on 05/17/23, asked if lab orders could be put in for her.

## 2023-02-14 NOTE — Telephone Encounter (Signed)
Ok sure, I just havent put them in yet.  Will do this soon   thanks

## 2023-02-14 NOTE — Telephone Encounter (Signed)
Pharmacy Patient Advocate Encounter- Botox BIV-Medical Benefit:  PA was submitted to [facility] and has been approved through: 5.9.24 TO 4.10.25 Authorization# [id number]  Please send prescription to Specialty Pharmacy: [facility]: [phone]

## 2023-02-14 NOTE — Progress Notes (Signed)
Patient ID: Lauren Gonzalez, female   DOB: 1986-07-09, 37 y.o.   MRN: [id number]        Chief Complaint: follow up allergy flare, hyperglycemia, hld, bipolar d/o       HPI:  Lauren Gonzalez is a 37 y.o. female  Does have several days ongoing severe nasal allergy symptoms with clearish congestion, itch and sneezing, without fever, pain, ST, cough, swelling or wheezing.  OTC meds not working well.  Pt denies chest pain, increased sob or doe, wheezing, orthopnea, PND, increased LE swelling, palpitations, dizziness or syncope.   Pt denies polydipsia, polyuria, or new focal neuro s/s.    Pt denies fever, wt loss, night sweats, loss of appetite, or other constitutional symptoms  Denies worsening depressive symptoms, suicidal ideation, or panic       Wt Readings from Last 3 Encounters:  02/14/23 188 lb (85.3 kg)  01/09/23 181 lb (82.1 kg)  10/04/22 180 lb 4 oz (81.8 kg)   BP Readings from Last 3 Encounters:  02/14/23 126/78  01/09/23 118/84  10/04/22 130/82         Past Medical History:  Diagnosis Date   ADD (attention deficit disorder) 12/29/2013   Allergic rhinitis, cause unspecified    DISORDER, BIPOLAR NEC    DVT (deep venous thrombosis) (HCC)    in leg July 2020 (vaginal ring discontinued after blood clot/started on Xarelto - off now)   Insomnia, unspecified    MIGRAINE HEADACHE    Past Surgical History:  Procedure Laterality Date   CHOLECYSTECTOMY     FRENULECTOMY, LINGUAL     of the base of the tongue   s/p left hip labral tear surgury  dec. 2010   TONSILLECTOMY     TONSILLECTOMY N/A 03/22/2018   Procedure: CAUTERY OF POST TONSILLAR BLEEDING;  Surgeon: Christia Reading, MD;  Location: [facility] OR;  Service: ENT;  Laterality: N/A;   TYMPANOSTOMY TUBE PLACEMENT     as a child   WISDOM TOOTH EXTRACTION      reports that she has quit smoking. She has never used smokeless tobacco. She reports that she does not currently use alcohol. She reports current drug use. Drug: Marijuana. family history  includes Arthritis in an other family member; Cancer in an other family member; Colon polyps in her father; Depression in an other family member; Diabetes in an other family member; Heart disease in an other family member; Stroke in an other family member. Allergies  Allergen Reactions   Influenza Vaccines Rash    Rash and swelling/redness in area of injection.   Levaquin [Levofloxacin] Hives and Rash   Current Outpatient Medications on File Prior to Visit  Medication Sig Dispense Refill   albuterol (PROVENTIL HFA;VENTOLIN HFA) 108 (90 Base) MCG/ACT inhaler Inhale 2 puffs into the lungs every 6 (six) hours as needed for wheezing or shortness of breath. 1 Inhaler 0   Collagen-Vitamin C-Biotin (COLLAGEN 1500/C) 500-50-0.8 MG CAPS Take by mouth.     ibuprofen (ADVIL) 800 MG tablet TAKE 1 TABLET(800 MG) BY MOUTH EVERY 6 HOURS AS NEEDED 60 tablet 2   Multiple Vitamin (MULTIVITAMIN) capsule Take 1 capsule by mouth daily.     Ubrogepant (UBRELVY) 100 MG TABS Take 1 tablet by mouth as needed (May repeat 1 tablet after 2 hours if needed.  Maximum 2 tablets in 24 hours). 16 tablet 3   botulinum toxin Type A (BOTOX) 200 units injection Inject 155 units IM into multiple site in the face,neck and head once every  90 days 1 each 4   No current facility-administered medications on file prior to visit.        ROS:  All others reviewed and negative.  Objective        PE:  BP 126/78 (BP Location: Right Arm, Patient Position: Sitting, Cuff Size: Normal)   Pulse 84   Temp 98.6 F (37 C) (Oral)   Ht 5\' 5"  (1.651 m)   Wt 188 lb (85.3 kg)   SpO2 98%   BMI 31.28 kg/m                 Constitutional: Pt appears in NAD               HENT: Head: NCAT.                Right Ear: External ear normal.                 Left Ear: External ear normal.  Bilat tm's with mild erythema.  Max sinus areas none tender.  Pharynx with mild erythema, no exudate               Eyes: . Pupils are equal, round, and reactive to  light. Conjunctivae and EOM are normal               Nose: without d/c or deformity               Neck: Neck supple. Gross normal ROM               Cardiovascular: Normal rate and regular rhythm.                 Pulmonary/Chest: Effort normal and breath sounds without rales or wheezing.                Abd:  Soft, NT, ND, + BS, no organomegaly               Neurological: Pt is alert. At baseline orientation, motor grossly intact               Skin: Skin is warm. No rashes, no other new lesions, LE edema - none               Psychiatric: Pt behavior is normal without agitation   Micro: none  Cardiac tracings I have personally interpreted today:  none  Pertinent Radiological findings (summarize): none   Lab Results  Component Value Date   WBC 7.9 08/02/2021   HGB 14.0 08/02/2021   HCT 42.2 08/02/2021   PLT 309.0 08/02/2021   GLUCOSE 85 08/02/2021   CHOL 200 08/02/2021   TRIG 63.0 08/02/2021   HDL 68.30 08/02/2021   LDLDIRECT 143.0 11/13/2018   LDLCALC 119 (H) 08/02/2021   ALT 14 08/02/2021   AST 15 08/02/2021   NA 136 08/02/2021   K 4.1 08/02/2021   CL 101 08/02/2021   CREATININE 0.81 08/02/2021   BUN 8 08/02/2021   CO2 27 08/02/2021   TSH 2.11 08/02/2021   HGBA1C 5.4 08/02/2021   Assessment/Plan:  Lauren Gonzalez is a 37 y.o. White or Caucasian [1] female with  has a past medical history of ADD (attention deficit disorder) (12/29/2013), Allergic rhinitis, cause unspecified, DISORDER, BIPOLAR NEC, DVT (deep venous thrombosis) (HCC), Insomnia, unspecified, and MIGRAINE HEADACHE.  DISORDER, BIPOLAR NEC Overall doing well,  to f/u any worsening symptoms or concerns, not requiring med tx for now  Mixed hyperlipidemia Lab Results  Component Value Date   LDLCALC 119 (H) 08/02/2021   Uncontrolled,declines statin for now, for lower chol diet   Hyperglycemia Lab Results  Component Value Date   HGBA1C 5.4 08/02/2021   Stable, pt to continue current medical treatment - diet,  wt control   Allergic rhinitis With mod flare - for depomedrol 80 mg IM, prednisone taper,  to f/u any worsening symptoms or concerns  Followup: Return in about 3 months (around 05/17/2023).  Oliver Barre, MD 02/18/2023 1:16 PM [facility]

## 2023-02-14 NOTE — Patient Instructions (Signed)
You had the steroid shot today  Please take all new medication as prescribed - the prednisone with refill if needed  Please continue all other medications as before, and refills have been done if requested.  Please have the pharmacy call with any other refills you may need.  Please continue your efforts at being more active, low cholesterol diet, and weight control.  You are otherwise up to date with prevention measures today.  Please keep your appointments with your specialists as you may have planned  Please make an Appointment to return in 3 months, or sooner if needed, also with Lab Appointment for testing done 3-5 days before at the FIRST FLOOR Lab (so this is for TWO appointments - please see the scheduling desk as you leave)

## 2023-02-15 MED ORDER — BOTOX 200 UNITS IJ SOLR: INTRAMUSCULAR | 4 refills | Status: DC

## 2023-02-15 NOTE — Telephone Encounter (Signed)
Patient advised of approval and Specialty pharmacy.

## 2023-02-15 NOTE — Addendum Note (Signed)
Addended by: Leida Lauth on: 02/15/2023 03:50 PM   Modules accepted: Orders

## 2023-02-18 ENCOUNTER — Encounter: Payer: Self-pay | Admitting: Internal Medicine

## 2023-02-18 ENCOUNTER — Telehealth: Payer: Self-pay

## 2023-02-18 NOTE — Telephone Encounter (Signed)
Tried calling patient no answer. LMOVM please call the office to Reschedule.

## 2023-02-18 NOTE — Telephone Encounter (Signed)
Follow up appt moved to 03/25/23 at 8:30 am. Botox Scheduled for May 31 at 8:30.

## 2023-02-18 NOTE — Assessment & Plan Note (Signed)
Overall doing well,  to f/u any worsening symptoms or concerns, not requiring med tx for now

## 2023-02-18 NOTE — Assessment & Plan Note (Signed)
Lab Results  Component Value Date   HGBA1C 5.4 08/02/2021   Stable, pt to continue current medical treatment  - diet, wt control  

## 2023-02-18 NOTE — Assessment & Plan Note (Signed)
Lab Results  Component Value Date   LDLCALC 119 (H) 08/02/2021   Uncontrolled,declines statin for now, for lower chol diet

## 2023-02-18 NOTE — Assessment & Plan Note (Signed)
With mod flare - for depomedrol 80 mg IM, prednisone taper,  to f/u any worsening symptoms or concerns

## 2023-02-22 ENCOUNTER — Ambulatory Visit: Payer: BC Managed Care – PPO | Admitting: Neurology

## 2023-02-25 ENCOUNTER — Ambulatory Visit: Payer: BC Managed Care – PPO | Admitting: Neurology

## 2023-02-25 DIAGNOSIS — R69 Illness, unspecified: Secondary | ICD-10-CM | POA: Diagnosis not present

## 2023-02-25 DIAGNOSIS — G43009 Migraine without aura, not intractable, without status migrainosus: Secondary | ICD-10-CM | POA: Diagnosis not present

## 2023-03-06 ENCOUNTER — Ambulatory Visit: Payer: BC Managed Care – PPO | Admitting: Neurology

## 2023-03-08 ENCOUNTER — Ambulatory Visit: Payer: BC Managed Care – PPO | Admitting: Neurology

## 2023-03-08 DIAGNOSIS — G43709 Chronic migraine without aura, not intractable, without status migrainosus: Secondary | ICD-10-CM | POA: Diagnosis not present

## 2023-03-08 MED ORDER — ONABOTULINUMTOXINA 100 UNITS IJ SOLR
200.0000 [IU] | Freq: Once | INTRAMUSCULAR | Status: AC
Start: 2023-03-08 — End: 2023-03-08
  Administered 2023-03-08: 200 [IU] via INTRAMUSCULAR

## 2023-03-08 NOTE — Progress Notes (Signed)

## 2023-03-20 NOTE — Progress Notes (Deleted)
NEUROLOGY FOLLOW UP OFFICE NOTE  [name] [id number]  Assessment/Plan:   Migraine without aura, without status migrainosus, not intractable   1.  Migraine prevention:  Botox 2.  Migraine rescue:  Bernita Raisin 100mg  3.  Limit use of pain relievers to no more than 2 days out of week to prevent risk of rebound or medication-overuse headache. 4.  Keep headache diary 5.  Follow up for next Botox; routine visit in one year or as needed.   Subjective:  Lauren Gonzalez is a 37 year old female with history of DVT who follows up for migraines.   UPDATE: Intensity:  Mild (rarely severe) Duration:  couple of hours to a day Frequency:  3 in 3 months (usually within the last month prior to Botox). May still have some slight nausea and eyes feel tired. Able to function.   She previously talked about increased jaw stiffness - difficulty opening mouth when eating.  Last botox I raised where I injected one of the temporal injections and has noted improvement.  Still with mild difficulty. Rescue therapy: Ubrelvy.  Current NSAIDS:  ibuprofen Current analgesics:  none Current triptans:  none Current ergotamine:  none Current anti-emetic:  Phenergan PR Current muscle relaxants:  Cyclobenzaprine 5mg  Current anti-anxiolytic:  Clonazepam 1mg  twice daily PRN Current sleep aide:  Ambien Current Antihypertensive medications:  none Current Antidepressant medications:  none Current Anticonvulsant medications:  none Current anti-CGRP:  Ubrelvy Current Vitamins/Herbal/Supplements:  none Current Antihistamines/Decongestants:  none Other therapy:  Botox Hormone/birth control:  Norlyda (progesterone)   Caffeine:  1 cup coffee a week.  Energy drink infrequently.  Occasional Pepsi Diet:  At least 64 oz water.  Occasional soda (Sprite, orange soda) Exercise:  Not routine Depression and Anxiety.  Has Bipolar disorder.  Currently stable. Other pain:  no Sleep hygiene:  Good when she is not working.   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   HISTORY:  Onset:  Since highschool.  Worse over past year, worse related to menstrual cycle. Location:  Starts  Unilateral (either side) and the becomes bilatera, frontal, face, temples, radiates to back of head Quality:  Starts as pressure and becomes pounding/throbbing Initial intensity:  Severe.  She denies new headache, thunderclap headache or severe headache that wakes her from sleep. Aura:  none Premonitory Phase:  Feels slightly "off"  Postdrome:  Tired Associated symptoms:  Nausea, vomiting, photophobia, phonophobia, osmophobia, hair and teeth hurt.  She denies associated unilateral numbness or weakness. Initial duration:  All day (sometimes in to next day) Initial frequency:  Mild-moderate migraine just before her period.  Severe during her period, may have one other one outside period.  (total 4 days a month) Initial frequency of abortive medication: 4 days a month Triggers:  Menses, weather changes, certain smells, emotional stress, caffeine-withdrawal, sleep deprivation) Relieving factors:  Coolness, dark place Activity: aggravates   MRI of brain with and without contrat from 11/09/04 personally reviewed and was normal.     Past NSAIDS:  Cambia, naproxen Past analgesics:  Excedrin, BC powder; Tylenol #3 Past abortive triptans:  Sumatriptan 100mg , Zomig 5mg  NS (effective but felt in a daze all day), Frova 2.5mg  (helpful but expensive) Past abortive ergotamine:  none Past muscle relaxants:  Robaxin Past anti-emetic:  promethazine Past antihypertensive medications:  none Past antidepressant/antipsychotic medications:  Sertraline, Vyanse Past anticonvulsant medications:  topiramate 50mg  twice daily (paresthesias) Past anti-CGRP:  Aimovig (caused elevated blood pressure) Past vitamins/Herbal/Supplements:  Magnesium, CoQ10 Past antihistamines/decongestants:  Zyrtec Other past therapies:  Changing birth control from  Nuvaring to progesterone, trigger point injections/nerve blocks     Family history of headache:  Dad (migraines, resolved)  PAST MEDICAL HISTORY: Past Medical History:  Diagnosis Date   ADD (attention deficit disorder) 12/29/2013   Allergic rhinitis, cause unspecified    DISORDER, BIPOLAR NEC    DVT (deep venous thrombosis) (HCC)    in leg July 2020 (vaginal ring discontinued after blood clot/started on Xarelto - off now)   Insomnia, unspecified    MIGRAINE HEADACHE     MEDICATIONS: Current Outpatient Medications on File Prior to Visit  Medication Sig Dispense Refill   albuterol (PROVENTIL HFA;VENTOLIN HFA) 108 (90 Base) MCG/ACT inhaler Inhale 2 puffs into the lungs every 6 (six) hours as needed for wheezing or shortness of breath. 1 Inhaler 0   botulinum toxin Type A (BOTOX) 200 units injection Inject 155 units IM into multiple site in the face,neck and head once every 90 days 1 each 4   Collagen-Vitamin C-Biotin (COLLAGEN 1500/C) 500-50-0.8 MG CAPS Take by mouth.     ibuprofen (ADVIL) 800 MG tablet TAKE 1 TABLET(800 MG) BY MOUTH EVERY 6 HOURS AS NEEDED 60 tablet 2   Multiple Vitamin (MULTIVITAMIN) capsule Take 1 capsule by mouth daily.     predniSONE (DELTASONE) 10 MG tablet 3 tabs by mouth per day for 3 days,2tabs per day for 3 days,1tab per day for 3 days 18 tablet 1   Ubrogepant (UBRELVY) 100 MG TABS Take 1 tablet by mouth as needed (May repeat 1 tablet after 2 hours if needed.  Maximum 2 tablets in 24 hours). 16 tablet 3   No current facility-administered medications on file prior to visit.    ALLERGIES: Allergies  Allergen Reactions   Influenza Vaccines Rash    Rash and swelling/redness in area of injection.   Levaquin [Levofloxacin] Hives and Rash    FAMILY HISTORY: Family History  Problem Relation Age of Onset   Colon polyps Father    Depression Other    Diabetes Other    Stroke Other    Heart disease Other    Cancer Other        lung and ovarian cancer    Arthritis Other       Objective:  *** General: No acute distress.  Patient appears well-groomed.   Head:  Normocephalic/atraumatic Eyes:  Fundi examined but not visualized Neck: supple, no paraspinal tenderness, full range of motion Heart:  Regular rate and rhythm Neurological Exam: alert and oriented.  Speech fluent and not dysarthric, language intact.  CN II-XII intact. Bulk and tone normal, muscle strength 5/5 throughout.  Sensation to light touch intact.  Deep tendon reflexes 2+ throughout, toes downgoing.  Finger to nose testing intact.  Gait normal, Romberg negative.   Shon Millet, DO  CC: Oliver Barre, MD

## 2023-03-25 ENCOUNTER — Ambulatory Visit: Payer: BC Managed Care – PPO | Admitting: Neurology

## 2023-04-04 NOTE — Progress Notes (Deleted)
 NEUROLOGY FOLLOW UP OFFICE NOTE  Lauren Gonzalez [id number]  Assessment/Plan:   Migraine without aura, without status migrainosus, not intractable   1.  Migraine prevention:  Botox 2.  Migraine rescue:  Ubrelvy 100mg 3.  Limit use of pain relievers to no more than 2 days out of week to prevent risk of rebound or medication-overuse headache. 4.  Keep headache diary 5.  Follow up for next Botox; routine visit in one year or as needed.   Subjective:  Lauren Gonzalez is a 37 year old female with history of DVT who follows up for migraines.   UPDATE: Intensity:  Mild (rarely severe) Duration:  couple of hours to a day Frequency:  3 in 3 months (usually within the last month prior to Botox). May still have some slight nausea and eyes feel tired. Able to function.   She previously talked about increased jaw stiffness - difficulty opening mouth when eating.  Last botox I raised where I injected one of the temporal injections and has noted improvement.  Still with mild difficulty. Rescue therapy: Ubrelvy.  Current NSAIDS:  ibuprofen Current analgesics:  none Current triptans:  none Current ergotamine:  none Current anti-emetic:  Phenergan PR Current muscle relaxants:  Cyclobenzaprine 5mg Current anti-anxiolytic:  Clonazepam 1mg twice daily PRN Current sleep aide:  Ambien Current Antihypertensive medications:  none Current Antidepressant medications:  none Current Anticonvulsant medications:  none Current anti-CGRP:  Ubrelvy Current Vitamins/Herbal/Supplements:  none Current Antihistamines/Decongestants:  none Other therapy:  Botox Hormone/birth control:  Norlyda (progesterone)   Caffeine:  1 cup coffee a week.  Energy drink infrequently.  Occasional Pepsi Diet:  At least 64 oz water.  Occasional soda (Sprite, orange soda) Exercise:  Not routine Depression and Anxiety.  Has Bipolar disorder.  Currently stable. Other pain:  no Sleep hygiene:  Good when she is not working.   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   HISTORY:  Onset:  Since highschool.  Worse over past year, worse related to menstrual cycle. Location:  Starts  Unilateral (either side) and the becomes bilatera, frontal, face, temples, radiates to back of head Quality:  Starts as pressure and becomes pounding/throbbing Initial intensity:  Severe.  She denies new headache, thunderclap headache or severe headache that wakes her from sleep. Aura:  none Premonitory Phase:  Feels slightly "off"  Postdrome:  Tired Associated symptoms:  Nausea, vomiting, photophobia, phonophobia, osmophobia, hair and teeth hurt.  She denies associated unilateral numbness or weakness. Initial duration:  All day (sometimes in to next day) Initial frequency:  Mild-moderate migraine just before her period.  Severe during her period, may have one other one outside period.  (total 4 days a month) Initial frequency of abortive medication: 4 days a month Triggers:  Menses, weather changes, certain smells, emotional stress, caffeine-withdrawal, sleep deprivation) Relieving factors:  Coolness, dark place Activity: aggravates   MRI of brain with and without contrat from 11/09/04 personally reviewed and was normal.     Past NSAIDS:  Cambia, naproxen Past analgesics:  Excedrin, BC powder; Tylenol #3 Past abortive triptans:  Sumatriptan 100mg, Zomig 5mg NS (effective but felt in a daze all day), Frova 2.5mg (helpful but expensive) Past abortive ergotamine:  none Past muscle relaxants:  Robaxin Past anti-emetic:  promethazine Past antihypertensive medications:  none Past antidepressant/antipsychotic medications:  Sertraline, Vyanse Past anticonvulsant medications:  topiramate 50mg twice daily (paresthesias) Past anti-CGRP:  Aimovig (caused elevated blood pressure) Past vitamins/Herbal/Supplements:  Magnesium, CoQ10 Past antihistamines/decongestants:    Zyrtec Other past therapies:  Changing birth control from  Nuvaring to progesterone, trigger point injections/nerve blocks     Family history of headache:  Dad (migraines, resolved)  PAST MEDICAL HISTORY: Past Medical History:  Diagnosis Date   ADD (attention deficit disorder) 12/29/2013   Allergic rhinitis, cause unspecified    DISORDER, BIPOLAR NEC    DVT (deep venous thrombosis) (HCC)    in leg July 2020 (vaginal ring discontinued after blood clot/started on Xarelto - off now)   Insomnia, unspecified    MIGRAINE HEADACHE     MEDICATIONS: Current Outpatient Medications on File Prior to Visit  Medication Sig Dispense Refill   albuterol (PROVENTIL HFA;VENTOLIN HFA) 108 (90 Base) MCG/ACT inhaler Inhale 2 puffs into the lungs every 6 (six) hours as needed for wheezing or shortness of breath. 1 Inhaler 0   botulinum toxin Type A (BOTOX) 200 units injection Inject 155 units IM into multiple site in the face,neck and head once every 90 days 1 each 4   Collagen-Vitamin C-Biotin (COLLAGEN 1500/C) 500-50-0.8 MG CAPS Take by mouth.     ibuprofen (ADVIL) 800 MG tablet TAKE 1 TABLET(800 MG) BY MOUTH EVERY 6 HOURS AS NEEDED 60 tablet 2   Multiple Vitamin (MULTIVITAMIN) capsule Take 1 capsule by mouth daily.     predniSONE (DELTASONE) 10 MG tablet 3 tabs by mouth per day for 3 days,2tabs per day for 3 days,1tab per day for 3 days 18 tablet 1   Ubrogepant (UBRELVY) 100 MG TABS Take 1 tablet by mouth as needed (May repeat 1 tablet after 2 hours if needed.  Maximum 2 tablets in 24 hours). 16 tablet 3   No current facility-administered medications on file prior to visit.    ALLERGIES: Allergies  Allergen Reactions   Influenza Vaccines Rash    Rash and swelling/redness in area of injection.   Levaquin [Levofloxacin] Hives and Rash    FAMILY HISTORY: Family History  Problem Relation Age of Onset   Colon polyps Father    Depression Other    Diabetes Other    Stroke Other    Heart disease Other    Cancer Other        lung and ovarian cancer    Arthritis Other       Objective:  *** General: No acute distress.  Patient appears well-groomed.   Head:  Normocephalic/atraumatic Eyes:  Fundi examined but not visualized Neck: supple, no paraspinal tenderness, full range of motion Heart:  Regular rate and rhythm Neurological Exam: alert and oriented.  Speech fluent and not dysarthric, language intact.  CN II-XII intact. Bulk and tone normal, muscle strength 5/5 throughout.  Sensation to light touch intact.  Deep tendon reflexes 2+ throughout, toes downgoing.  Finger to nose testing intact.  Gait normal, Romberg negative.    , DO  CC: James John, MD       

## 2023-04-08 ENCOUNTER — Ambulatory Visit: Payer: BC Managed Care – PPO | Admitting: Neurology

## 2023-05-17 ENCOUNTER — Encounter: Payer: BC Managed Care – PPO | Admitting: Internal Medicine

## 2023-05-24 ENCOUNTER — Ambulatory Visit: Payer: BC Managed Care – PPO | Admitting: Neurology

## 2023-05-28 DIAGNOSIS — R69 Illness, unspecified: Secondary | ICD-10-CM | POA: Diagnosis not present

## 2023-06-02 ENCOUNTER — Other Ambulatory Visit: Payer: Self-pay

## 2023-06-02 ENCOUNTER — Emergency Department (HOSPITAL_BASED_OUTPATIENT_CLINIC_OR_DEPARTMENT_OTHER): Payer: BC Managed Care – PPO

## 2023-06-02 ENCOUNTER — Encounter (HOSPITAL_BASED_OUTPATIENT_CLINIC_OR_DEPARTMENT_OTHER): Payer: Self-pay | Admitting: Emergency Medicine

## 2023-06-02 ENCOUNTER — Inpatient Hospital Stay (HOSPITAL_BASED_OUTPATIENT_CLINIC_OR_DEPARTMENT_OTHER)
Admission: EM | Admit: 2023-06-02 | Discharge: 2023-06-05 | DRG: 660 | Disposition: A | Discharge status: Home / Self Care | Payer: BC Managed Care – PPO | Attending: Internal Medicine | Admitting: Internal Medicine

## 2023-06-02 DIAGNOSIS — Z887 Allergy status to serum and vaccine status: Secondary | ICD-10-CM

## 2023-06-02 DIAGNOSIS — E876 Hypokalemia: Secondary | ICD-10-CM | POA: Diagnosis not present

## 2023-06-02 DIAGNOSIS — R112 Nausea with vomiting, unspecified: Secondary | ICD-10-CM | POA: Diagnosis present

## 2023-06-02 DIAGNOSIS — F909 Attention-deficit hyperactivity disorder, unspecified type: Secondary | ICD-10-CM | POA: Diagnosis not present

## 2023-06-02 DIAGNOSIS — F319 Bipolar disorder, unspecified: Secondary | ICD-10-CM | POA: Diagnosis not present

## 2023-06-02 DIAGNOSIS — R1110 Vomiting, unspecified: Secondary | ICD-10-CM | POA: Diagnosis not present

## 2023-06-02 DIAGNOSIS — N201 Calculus of ureter: Secondary | ICD-10-CM | POA: Diagnosis not present

## 2023-06-02 DIAGNOSIS — Z87891 Personal history of nicotine dependence: Secondary | ICD-10-CM | POA: Diagnosis not present

## 2023-06-02 DIAGNOSIS — E871 Hypo-osmolality and hyponatremia: Secondary | ICD-10-CM | POA: Diagnosis present

## 2023-06-02 DIAGNOSIS — N3001 Acute cystitis with hematuria: Secondary | ICD-10-CM | POA: Diagnosis present

## 2023-06-02 DIAGNOSIS — Z79899 Other long term (current) drug therapy: Secondary | ICD-10-CM | POA: Diagnosis not present

## 2023-06-02 DIAGNOSIS — N139 Obstructive and reflux uropathy, unspecified: Secondary | ICD-10-CM | POA: Diagnosis present

## 2023-06-02 DIAGNOSIS — Z86718 Personal history of other venous thrombosis and embolism: Secondary | ICD-10-CM

## 2023-06-02 DIAGNOSIS — J309 Allergic rhinitis, unspecified: Secondary | ICD-10-CM | POA: Diagnosis present

## 2023-06-02 DIAGNOSIS — Q630 Accessory kidney: Secondary | ICD-10-CM | POA: Diagnosis not present

## 2023-06-02 DIAGNOSIS — N202 Calculus of kidney with calculus of ureter: Secondary | ICD-10-CM | POA: Diagnosis not present

## 2023-06-02 DIAGNOSIS — Z881 Allergy status to other antibiotic agents status: Secondary | ICD-10-CM | POA: Diagnosis not present

## 2023-06-02 DIAGNOSIS — Z9049 Acquired absence of other specified parts of digestive tract: Secondary | ICD-10-CM

## 2023-06-02 DIAGNOSIS — R109 Unspecified abdominal pain: Secondary | ICD-10-CM | POA: Diagnosis not present

## 2023-06-02 DIAGNOSIS — E860 Dehydration: Secondary | ICD-10-CM | POA: Diagnosis present

## 2023-06-02 DIAGNOSIS — D649 Anemia, unspecified: Secondary | ICD-10-CM | POA: Diagnosis present

## 2023-06-02 LAB — URINALYSIS, ROUTINE W REFLEX MICROSCOPIC
Bacteria, UA: NONE SEEN
Bilirubin Urine: NEGATIVE
Glucose, UA: NEGATIVE mg/dL
Ketones, ur: >80 mg/dL — AB
Nitrite: NEGATIVE
Protein, ur: 30 mg/dL — AB
RBC / HPF: >50 RBC/hpf (ref 0–5)
Specific Gravity, Urine: 1.028 (ref 1.005–1.030)
pH: 6 (ref 5.0–8.0)

## 2023-06-02 LAB — COMPREHENSIVE METABOLIC PANEL
ALT: 11 U/L (ref 0–44)
AST: 14 U/L — ABNORMAL LOW (ref 15–41)
Albumin: 4.7 g/dL (ref 3.5–5.0)
Alkaline Phosphatase: 48 U/L (ref 38–126)
Anion gap: 13 (ref 5–15)
BUN: 13 mg/dL (ref 6–20)
CO2: 22 mmol/L (ref 22–32)
Calcium: 9.8 mg/dL (ref 8.9–10.3)
Chloride: 102 mmol/L (ref 98–111)
Creatinine, Ser: 0.96 mg/dL (ref 0.44–1.00)
GFR, Estimated: >60 mL/min (ref >60)
Glucose, Bld: 117 mg/dL — ABNORMAL HIGH (ref 70–99)
Potassium: 3.7 mmol/L (ref 3.5–5.1)
Sodium: 137 mmol/L (ref 135–145)
Total Bilirubin: 1 mg/dL (ref 0.3–1.2)
Total Protein: 7.6 g/dL (ref 6.5–8.1)

## 2023-06-02 LAB — CBC WITH DIFFERENTIAL/PLATELET
Abs Immature Granulocytes: 0.06 10*3/uL (ref 0.00–0.07)
Basophils Absolute: 0.1 10*3/uL (ref 0.0–0.1)
Basophils Relative: 1 %
Eosinophils Absolute: 0.1 10*3/uL (ref 0.0–0.5)
Eosinophils Relative: 1 %
HCT: 39 % (ref 36.0–46.0)
Hemoglobin: 13.7 g/dL (ref 12.0–15.0)
Immature Granulocytes: 1 %
Lymphocytes Relative: 15 %
Lymphs Abs: 1.9 10*3/uL (ref 0.7–4.0)
MCH: 31.7 pg (ref 26.0–34.0)
MCHC: 35.1 g/dL (ref 30.0–36.0)
MCV: 90.3 fL (ref 80.0–100.0)
Monocytes Absolute: 0.8 10*3/uL (ref 0.1–1.0)
Monocytes Relative: 6 %
Neutro Abs: 10.2 10*3/uL — ABNORMAL HIGH (ref 1.7–7.7)
Neutrophils Relative %: 76 %
Platelets: 348 10*3/uL (ref 150–400)
RBC: 4.32 MIL/uL (ref 3.87–5.11)
RDW: 11.9 % (ref 11.5–15.5)
WBC: 13.2 10*3/uL — ABNORMAL HIGH (ref 4.0–10.5)
nRBC: 0 % (ref 0.0–0.2)

## 2023-06-02 LAB — LIPASE, BLOOD: Lipase: 22 U/L (ref 11–51)

## 2023-06-02 LAB — PREGNANCY, URINE: Preg Test, Ur: NEGATIVE

## 2023-06-02 MED ORDER — SODIUM CHLORIDE 0.9 % IV SOLN
1.0000 g | INTRAVENOUS | Status: DC
  Administered 2023-06-03: 1 g via INTRAVENOUS
  Filled 2023-06-02: qty 10

## 2023-06-02 MED ORDER — KETOROLAC TROMETHAMINE 15 MG/ML IJ SOLN
15.0000 mg | Freq: Once | INTRAMUSCULAR | Status: AC
  Administered 2023-06-02: 15 mg via INTRAVENOUS
  Filled 2023-06-02: qty 1

## 2023-06-02 MED ORDER — SODIUM CHLORIDE 0.9 % IV SOLN
1.0000 g | Freq: Once | INTRAVENOUS | Status: AC
  Administered 2023-06-02: 1 g via INTRAVENOUS
  Filled 2023-06-02: qty 10

## 2023-06-02 MED ORDER — ONDANSETRON HCL 4 MG/2ML IJ SOLN
4.0000 mg | Freq: Once | INTRAMUSCULAR | Status: AC
  Administered 2023-06-02: 4 mg via INTRAVENOUS
  Filled 2023-06-02: qty 2

## 2023-06-02 MED ORDER — MORPHINE SULFATE (PF) 4 MG/ML IV SOLN
4.0000 mg | Freq: Once | INTRAVENOUS | Status: AC
  Administered 2023-06-02: 4 mg via INTRAVENOUS
  Filled 2023-06-02: qty 1

## 2023-06-02 MED ORDER — HYDROMORPHONE HCL 1 MG/ML IJ SOLN
1.0000 mg | INTRAMUSCULAR | Status: DC | PRN
  Administered 2023-06-03 – 2023-06-05 (×7): 1 mg via INTRAVENOUS
  Filled 2023-06-02 (×7): qty 1

## 2023-06-02 MED ORDER — ONDANSETRON HCL 4 MG/2ML IJ SOLN: 4.0000 mg | Freq: Four times a day (QID) | INTRAMUSCULAR | Status: DC | PRN

## 2023-06-02 MED ORDER — ONDANSETRON HCL 4 MG/2ML IJ SOLN
4.0000 mg | Freq: Four times a day (QID) | INTRAMUSCULAR | Status: DC | PRN
  Administered 2023-06-04: 4 mg via INTRAVENOUS
  Filled 2023-06-02: qty 2

## 2023-06-02 MED ORDER — SENNOSIDES-DOCUSATE SODIUM 8.6-50 MG PO TABS
1.0000 | ORAL_TABLET | Freq: Every evening | ORAL | Status: DC | PRN
  Administered 2023-06-05: 1 via ORAL
  Filled 2023-06-02: qty 1

## 2023-06-02 MED ORDER — LACTATED RINGERS IV BOLUS
1000.0000 mL | Freq: Once | INTRAVENOUS | Status: AC
  Administered 2023-06-02: 1000 mL via INTRAVENOUS

## 2023-06-02 MED ORDER — SODIUM CHLORIDE 0.9 % IV SOLN
12.5000 mg | Freq: Once | INTRAVENOUS | Status: AC
  Administered 2023-06-02: 12.5 mg via INTRAVENOUS
  Filled 2023-06-02: qty 0.5

## 2023-06-02 MED ORDER — HYDROMORPHONE HCL 1 MG/ML IJ SOLN
0.5000 mg | INTRAMUSCULAR | Status: DC | PRN
  Administered 2023-06-02: 0.5 mg via INTRAVENOUS
  Filled 2023-06-02: qty 0.5

## 2023-06-02 MED ORDER — ACETAMINOPHEN 325 MG PO TABS: 650.0000 mg | ORAL_TABLET | Freq: Four times a day (QID) | ORAL | Status: DC | PRN

## 2023-06-02 MED ORDER — KETOROLAC TROMETHAMINE 30 MG/ML IJ SOLN
30.0000 mg | Freq: Four times a day (QID) | INTRAMUSCULAR | Status: AC
  Administered 2023-06-02 – 2023-06-03 (×4): 30 mg via INTRAVENOUS
  Filled 2023-06-02 (×4): qty 1

## 2023-06-02 MED ORDER — HYDROMORPHONE HCL 1 MG/ML IJ SOLN
1.0000 mg | Freq: Once | INTRAMUSCULAR | Status: AC
  Administered 2023-06-02: 1 mg via INTRAVENOUS
  Filled 2023-06-02: qty 1

## 2023-06-02 MED ORDER — SODIUM CHLORIDE 0.9 % IV SOLN
12.5000 mg | Freq: Four times a day (QID) | INTRAVENOUS | Status: DC | PRN
  Filled 2023-06-02: qty 0.5

## 2023-06-02 MED ORDER — OXYCODONE HCL 5 MG PO TABS
5.0000 mg | ORAL_TABLET | ORAL | Status: DC | PRN
  Administered 2023-06-03 – 2023-06-05 (×6): 5 mg via ORAL
  Filled 2023-06-02 (×7): qty 1

## 2023-06-02 MED ORDER — SODIUM CHLORIDE 0.9 % IV SOLN
12.5000 mg | Freq: Four times a day (QID) | INTRAVENOUS | Status: AC
  Administered 2023-06-02 – 2023-06-03 (×4): 12.5 mg via INTRAVENOUS
  Filled 2023-06-02 (×4): qty 12.5

## 2023-06-02 MED ORDER — PROMETHAZINE HCL 25 MG/ML IJ SOLN
INTRAMUSCULAR | Status: AC
  Filled 2023-06-02: qty 1

## 2023-06-02 MED ORDER — ACETAMINOPHEN 650 MG RE SUPP: 650.0000 mg | Freq: Four times a day (QID) | RECTAL | Status: DC | PRN

## 2023-06-02 MED ORDER — POTASSIUM CHLORIDE IN NACL 20-0.9 MEQ/L-% IV SOLN
INTRAVENOUS | Status: DC
  Filled 2023-06-02 (×2): qty 1000

## 2023-06-02 NOTE — ED Triage Notes (Signed)
Left flank pain, severe, nausea and notes blood in urine. Reports feeling urge to urinate but unable to go. Start this AM 9:45AM

## 2023-06-02 NOTE — ED Notes (Signed)
Report given to the floor RN.

## 2023-06-02 NOTE — Plan of Care (Signed)
  Problem: Education: Goal: Knowledge of General Education information will improve Description: Including pain rating scale, medication(s)/side effects and non-pharmacologic comfort measures Outcome: Progressing   Problem: Health Behavior/Discharge Planning: Goal: Ability to manage health-related needs will improve Outcome: Progressing   Problem: Clinical Measurements: Goal: Ability to maintain clinical measurements within normal limits will improve Outcome: Progressing   Problem: Activity: Goal: Risk for activity intolerance will decrease Outcome: Progressing   Problem: Nutrition: Goal: Adequate nutrition will be maintained Outcome: Progressing   Problem: Coping: Goal: Level of anxiety will decrease Outcome: Progressing   Problem: Safety: Goal: Ability to remain free from injury will improve Outcome: Progressing   

## 2023-06-02 NOTE — H&P (Incomplete)
PCP:   Corwin Levins, MD   Chief Complaint:  Abdominal pain, nausea vomiting  HPI: This is a 37 year old female with PMH of ADHD and bipolar disorder.  Per patient after breakfast today she developed horrible pain in her abdomen.  Her pain persisted and progressed.  Her lower abdomen felt swollen and full.  She has severe left flank pain, which she describes as stabbing.  She additionally c/o nausea and vomiting. She went to the she went to [facility].  At [facility] patient's WBC 13.2.  Urinalysis shows ketones and large hemoglobin.  CT stone study shows a 3 to 4 mm proximal left ureteral stone with mild collecting system dilation.  Punctate nonobstructing right-sided stone.  There were unable to get adequate pain control in the ED.  Urologist Dr. Mena Goes contacted by EDP.  His recommendation admit for pain control.  He did not believe stent would be of benefit given its size.  However if pain and nausea persists consult formally.  Review of Systems:  Per HPI  Past Medical History: Past Medical History:  Diagnosis Date   ADD (attention deficit disorder) 12/29/2013   Allergic rhinitis, cause unspecified    DISORDER, BIPOLAR NEC    DVT (deep venous thrombosis) (HCC)    in leg July 2020 (vaginal ring discontinued after blood clot/started on Xarelto - off now)   Insomnia, unspecified    MIGRAINE HEADACHE    Past Surgical History:  Procedure Laterality Date   CHOLECYSTECTOMY     FRENULECTOMY, LINGUAL     of the base of the tongue   s/p left hip labral tear surgury  dec. 2010   TONSILLECTOMY     TONSILLECTOMY N/A 03/22/2018   Procedure: CAUTERY OF POST TONSILLAR BLEEDING;  Surgeon: Christia Reading, MD;  Location: [facility] OR;  Service: ENT;  Laterality: N/A;   TYMPANOSTOMY TUBE PLACEMENT     as a child   WISDOM TOOTH EXTRACTION      Medications: Prior to Admission medications   Medication Sig Start Date End Date Taking? Authorizing Provider  albuterol (PROVENTIL HFA;VENTOLIN HFA) 108  (90 Base) MCG/ACT inhaler Inhale 2 puffs into the lungs every 6 (six) hours as needed for wheezing or shortness of breath. 08/21/16  Yes Nche, Bonna Gains, NP  botulinum toxin Type A (BOTOX) 200 units injection Inject 155 units IM into multiple site in the face,neck and head once every 90 days Patient taking differently: Inject 155 Units into the muscle See admin instructions. Inject 155 units IM into multiple sites in the face,neck and head once every 90 days 02/15/23  Yes Jaffe, Adam R, DO  Collagen-Vitamin C-Biotin (COLLAGEN 1500/C) 500-50-0.8 MG CAPS Take 2 capsules by mouth daily.   Yes [provider]  ibuprofen (ADVIL) 800 MG tablet TAKE 1 TABLET(800 MG) BY MOUTH EVERY 6 HOURS AS NEEDED Patient taking differently: Take 800 mg by mouth every 6 (six) hours as needed (for headaches). 12/31/19  Yes Corwin Levins, MD  Multiple Vitamin (MULTIVITAMIN) capsule Take 1 capsule by mouth daily.   Yes [provider]  Ubrogepant (UBRELVY) 100 MG TABS Take 1 tablet by mouth as needed (May repeat 1 tablet after 2 hours if needed.  Maximum 2 tablets in 24 hours). Patient taking differently: Take 100 mg by mouth as needed (for migraines and may repeat once in 2 hours, if no relief. Maximum 200 mg (2 tablets) in 24 hours.). 06/01/19  Yes Jaffe, Adam R, DO  predniSONE (DELTASONE) 10 MG tablet 3 tabs by  mouth per day for 3 days,2tabs per day for 3 days,1tab per day for 3 days Patient not taking: Reported on 06/02/2023 02/14/23   Corwin Levins, MD    Allergies:   Allergies  Allergen Reactions   Plum Pulp Anaphylaxis, Itching, Swelling and Other (See Comments)    Throat became swollen   Influenza Vaccines Swelling, Rash and Other (See Comments)    Rash and swelling/redness in area of injection.   Levaquin [Levofloxacin] Hives and Rash    Social History:  reports that she has quit smoking. She has never used smokeless tobacco. She reports that she does not currently use alcohol. She reports  current drug use. Drug: Marijuana.  Family History: Family History  Problem Relation Age of Onset   Colon polyps Father    Depression Other    Diabetes Other    Stroke Other    Heart disease Other    Cancer Other        lung and ovarian cancer   Arthritis Other     Physical Exam: Vitals:   06/02/23 1600 06/02/23 1800 06/02/23 1901 06/02/23 2036  BP: 125/79 122/79 121/72   Pulse: 94 80 72   Resp: 16 16 16    Temp:   98.7 F (37.1 C)   TempSrc:   Oral   SpO2: 100% 100% 100%   Weight:    80.4 kg  Height:    5\' 5"  (1.651 m)    General: A and O x 3, well developed and nourished, no acute distress Eyes: Pink conjunctiva, no scleral icterus ENT: Moist oral mucosa, neck supple, no thyromegaly Lungs: CTA B/L, no wheeze, no crackles, no use of accessory muscles Cardiovascular: RRR, no regurgitation, no gallops, no murmurs.  Abdomen: soft, positive BS, left flank discomfort, not an acute abdomen GU: not examined Neuro: CN II - XII grossly intact, sensation intact Musculoskeletal: strength 5/5 all extremities, no clubbing, cyanosis or edema Skin: no rash, no subcutaneous crepitation, no decubitus Psych: appropriate patient  Labs on Admission:  Recent Labs    06/02/23 1416  NA 137  K 3.7  CL 102  CO2 22  GLUCOSE 117*  BUN 13  CREATININE 0.96  CALCIUM 9.8   Recent Labs    06/02/23 1416  AST 14*  ALT 11  ALKPHOS 48  BILITOT 1.0  PROT 7.6  ALBUMIN 4.7   Recent Labs    06/02/23 1416  LIPASE 22   Recent Labs    06/02/23 1416  WBC 13.2*  NEUTROABS 10.2*  HGB 13.7  HCT 39.0  MCV 90.3  PLT 348    Radiological Exams on Admission: CT Renal Stone Study  Result Date: 06/02/2023 CLINICAL DATA:  Left flank pain with nausea and blood in urine. EXAM: CT ABDOMEN AND PELVIS WITHOUT CONTRAST TECHNIQUE: Multidetector CT imaging of the abdomen and pelvis was performed following the standard protocol without IV contrast. RADIATION DOSE REDUCTION: This exam was performed  according to the departmental dose-optimization program which includes automated exposure control, adjustment of the mA and/or kV according to patient size and/or use of iterative reconstruction technique. COMPARISON:  CT November 2010 FINDINGS: Lower chest: Lung bases are grossly clear.  No pleural effusion. Hepatobiliary: No focal liver abnormality is seen. Status post cholecystectomy. No biliary dilatation. Pancreas: Unremarkable. No pancreatic ductal dilatation or surrounding inflammatory changes. Spleen: Normal in size without focal abnormality. Adrenals/Urinary Tract: Adrenal glands are preserved. There is a punctate nonobstructing upper pole right-sided renal stone. No right-sided renal collecting system dilatation.  Duplicated right renal collecting system. No ureteral stones on the right. Bladder is underdistended. No left-sided intrarenal collecting system stones. Slight ectasia of the left renal collecting system proximally with a stone in the proximal left ureter best seen on coronal series 5, image 47 with a longitudinal length 3-4 mm. No additional left ureteral stones more distal. Stomach/Bowel: Stomach and small bowel are nondilated on this non oral contrast examination. Large bowel also has a normal course and caliber with scattered stool. Normal retrocecal appendix. Vascular/Lymphatic: Normal caliber aorta and IVC. Circumaortic left renal vein. No developing abnormal lymph node enlargement present in the abdomen and pelvis. Reproductive: Uterus is present. There is a area of macroscopic fat associated with the left ovary measuring 15 mm on series 2, image 62. Possible dermoid. Other: No free intra-abdominal air or free fluid. Musculoskeletal: Mild degenerative changes along the spine. IMPRESSION: 3-4 mm proximal left ureteral stone with mild collecting system dilatation. Punctate nonobstructing right-sided stone. 15 mm area of fat in the left adnexa which may be ovarian and via small ovarian  dermoid. Electronically Signed   By: Karen Kays M.D.   On: 06/02/2023 16:40    Assessment/Plan Present on Admission:  Acute unilateral obstructive uropathy  Nausea & vomiting -Antiemetics as needed -IV Toradol and Zofran scheduled x 24 hours -As needed Phenergan and Dilaudid -IV fluid hydration -Continue IV Rocephin.  Infection less likely   Migraines  DVT prophylaxis: SCDs  Maxamilian Amadon 06/02/2023, 8:40 PM

## 2023-06-02 NOTE — ED Notes (Signed)
Report given to [facility] via different RN.Marland KitchenMarland Kitchen

## 2023-06-02 NOTE — Progress Notes (Signed)
Plan of Care Note for accepted transfer  Patient: Lauren Gonzalez    SWF:[id number]  DOA: 06/02/2023     Nursing staff, Please call [facility] Admits & Consults System-Wide number on Amion as soon as patient's arrival to the unit (not the listed attending) so that the appropriate admitting provider can evaluate the pt. ASAP to avoid any delay in care.  Facility requesting transfer: [facility] Requesting Provider: Richardson Dopp, Dr. Renae Gloss Reason for transfer: Admission Facility course: PMH of ADHD, bipolar disorder presented to the hospital with complaints of severe left flank pain with nausea and vomiting as well as difficulty urination. Found to have 4 mm proximal left ureteral stone with dilation of the collecting system.  Currently being treated with IV Rocephin for UTI as well. Required multiple doses of IV pain medication as well as IV antiemetic, despite which she continues to actively vomit and therefore ED provider recommends admission. ED provider discussed with urology on-call recommend admission for symptom control but does not think the patient will require stent placement.  Plan of care: The patient is accepted for admission to Med-surg  unit, at [facility]. Urology not formally consulted, if no improvement may require reconsultation with urology.  Author: Lynden Oxford, MD  06/02/2023  Check [email or web address] for on-call coverage.

## 2023-06-02 NOTE — ED Notes (Signed)
Pt aware of the need for a urine... Unable to currently collect.... 

## 2023-06-02 NOTE — ED Notes (Signed)
 Kim with [facility] called for transport

## 2023-06-02 NOTE — ED Provider Notes (Cosign Needed Addendum)
[place] EMERGENCY DEPARTMENT AT [facility] Provider Note   CSN: [id number] Arrival date & time: 06/02/23  1257     History  Chief Complaint  Patient presents with   Flank Pain    Lauren Gonzalez is a 37 y.o. female with past medical history ADHD, bipolar disorder who presents to the ED complaining of sudden onset of severe left flank pain, nausea, vomiting, and difficulty urinating this morning.  No history of similar symptoms.  Previous surgeries include a cholecystectomy many years ago.  No dysuria though has had gross hematuria.  No fever or diarrhea.  Last bowel movement was this morning and was normal.  No history of kidney stones or kidney disease.  No medications taken before arrival.  Does not think that it is likely she is pregnant.       Home Medications Prior to Admission medications   Medication Sig Start Date End Date Taking? Authorizing Provider  albuterol (PROVENTIL HFA;VENTOLIN HFA) 108 (90 Base) MCG/ACT inhaler Inhale 2 puffs into the lungs every 6 (six) hours as needed for wheezing or shortness of breath. 08/21/16   Nche, Bonna Gains, NP  botulinum toxin Type A (BOTOX) 200 units injection Inject 155 units IM into multiple site in the face,neck and head once every 90 days 02/15/23   Drema Dallas, DO  Collagen-Vitamin C-Biotin (COLLAGEN 1500/C) 500-50-0.8 MG CAPS Take by mouth.    [provider]  ibuprofen (ADVIL) 800 MG tablet TAKE 1 TABLET(800 MG) BY MOUTH EVERY 6 HOURS AS NEEDED 12/31/19   Corwin Levins, MD  Multiple Vitamin (MULTIVITAMIN) capsule Take 1 capsule by mouth daily.    [provider]  predniSONE (DELTASONE) 10 MG tablet 3 tabs by mouth per day for 3 days,2tabs per day for 3 days,1tab per day for 3 days 02/14/23   Corwin Levins, MD  Ubrogepant (UBRELVY) 100 MG TABS Take 1 tablet by mouth as needed (May repeat 1 tablet after 2 hours if needed.  Maximum 2 tablets in 24 hours). 06/01/19   Drema Dallas, DO      Allergies     Influenza vaccines and Levaquin [levofloxacin]    Review of Systems   Review of Systems  All other systems reviewed and are negative.   Physical Exam Updated Vital Signs BP 125/79 (BP Location: Right Arm)   Pulse 94   Temp (!) 96.8 F (36 C) (Temporal)   Resp 16   LMP 05/19/2023 (Approximate)   SpO2 100%  Physical Exam Vitals and nursing note reviewed.  Constitutional:      General: She is in acute distress.     Appearance: Normal appearance. She is not diaphoretic.     Comments: Actively vomiting  HENT:     Head: Normocephalic and atraumatic.     Mouth/Throat:     Mouth: Mucous membranes are dry.  Eyes:     Conjunctiva/sclera: Conjunctivae normal.  Cardiovascular:     Rate and Rhythm: Normal rate and regular rhythm.     Heart sounds: No murmur heard. Pulmonary:     Effort: Pulmonary effort is normal.     Breath sounds: Normal breath sounds.  Abdominal:     General: Abdomen is flat. There is no distension.     Palpations: Abdomen is soft.     Tenderness: There is abdominal tenderness (LLQ). There is left CVA tenderness. There is no right CVA tenderness, guarding or rebound.  Musculoskeletal:        General: Normal range  of motion.     Cervical back: Normal range of motion and neck supple.     Right lower leg: No edema.     Left lower leg: No edema.  Skin:    General: Skin is warm and dry.     Capillary Refill: Capillary refill takes less than 2 seconds.     Findings: No rash.  Neurological:     Mental Status: She is alert. Mental status is at baseline.  Psychiatric:        Mood and Affect: Mood normal.        Behavior: Behavior normal.     ED Results / Procedures / Treatments   Labs (all labs ordered are listed, but only abnormal results are displayed) Labs Reviewed  CBC WITH DIFFERENTIAL/PLATELET - Abnormal; Notable for the following components:      Result Value   WBC 13.2 (*)    Neutro Abs 10.2 (*)    All other components within normal limits   COMPREHENSIVE METABOLIC PANEL - Abnormal; Notable for the following components:   Glucose, Bld 117 (*)    AST 14 (*)    All other components within normal limits  URINALYSIS, ROUTINE W REFLEX MICROSCOPIC - Abnormal; Notable for the following components:   Color, Urine ORANGE (*)    APPearance CLOUDY (*)    Hgb urine dipstick LARGE (*)    Ketones, ur >80 (*)    Protein, ur 30 (*)    Leukocytes,Ua SMALL (*)    All other components within normal limits  LIPASE, BLOOD  PREGNANCY, URINE    EKG None  Radiology CT Renal Stone Study  Result Date: 06/02/2023 CLINICAL DATA:  Left flank pain with nausea and blood in urine. EXAM: CT ABDOMEN AND PELVIS WITHOUT CONTRAST TECHNIQUE: Multidetector CT imaging of the abdomen and pelvis was performed following the standard protocol without IV contrast. RADIATION DOSE REDUCTION: This exam was performed according to the departmental dose-optimization program which includes automated exposure control, adjustment of the mA and/or kV according to patient size and/or use of iterative reconstruction technique. COMPARISON:  CT November 2010 FINDINGS: Lower chest: Lung bases are grossly clear.  No pleural effusion. Hepatobiliary: No focal liver abnormality is seen. Status post cholecystectomy. No biliary dilatation. Pancreas: Unremarkable. No pancreatic ductal dilatation or surrounding inflammatory changes. Spleen: Normal in size without focal abnormality. Adrenals/Urinary Tract: Adrenal glands are preserved. There is a punctate nonobstructing upper pole right-sided renal stone. No right-sided renal collecting system dilatation. Duplicated right renal collecting system. No ureteral stones on the right. Bladder is underdistended. No left-sided intrarenal collecting system stones. Slight ectasia of the left renal collecting system proximally with a stone in the proximal left ureter best seen on coronal series 5, image 47 with a longitudinal length 3-4 mm. No additional  left ureteral stones more distal. Stomach/Bowel: Stomach and small bowel are nondilated on this non oral contrast examination. Large bowel also has a normal course and caliber with scattered stool. Normal retrocecal appendix. Vascular/Lymphatic: Normal caliber aorta and IVC. Circumaortic left renal vein. No developing abnormal lymph node enlargement present in the abdomen and pelvis. Reproductive: Uterus is present. There is a area of macroscopic fat associated with the left ovary measuring 15 mm on series 2, image 62. Possible dermoid. Other: No free intra-abdominal air or free fluid. Musculoskeletal: Mild degenerative changes along the spine. IMPRESSION: 3-4 mm proximal left ureteral stone with mild collecting system dilatation. Punctate nonobstructing right-sided stone. 15 mm area of fat in the left adnexa  which may be ovarian and via small ovarian dermoid. Electronically Signed   By: Karen Kays M.D.   On: 06/02/2023 16:40    Procedures Procedures  Bladder scan with 68 mL  Medications Ordered in ED Medications  promethazine (PHENERGAN) 12.5 mg in sodium chloride 0.9 % 50 mL IVPB (has no administration in time range)  ketorolac (TORADOL) 15 MG/ML injection 15 mg (15 mg Intravenous Given 06/02/23 1406)  ondansetron (ZOFRAN) injection 4 mg (4 mg Intravenous Given 06/02/23 1409)  lactated ringers bolus 1,000 mL (0 mLs Intravenous Stopped 06/02/23 1505)  morphine (PF) 4 MG/ML injection 4 mg (4 mg Intravenous Given 06/02/23 1513)  cefTRIAXone (ROCEPHIN) 1 g in sodium chloride 0.9 % 100 mL IVPB (0 g Intravenous Stopped 06/02/23 1628)  HYDROmorphone (DILAUDID) injection 1 mg (1 mg Intravenous Given 06/02/23 1557)  ondansetron (ZOFRAN) injection 4 mg (4 mg Intravenous Given 06/02/23 1556)    ED Course/ Medical Decision Making/ A&P                                Medical Decision Making Amount and/or Complexity of Data Reviewed Labs: ordered. Decision-making details documented in ED Course. Radiology:  ordered. Decision-making details documented in ED Course.  Risk Prescription drug management. Decision regarding hospitalization.   Medical Decision Making:   BEXLI HODGSON is a 37 y.o. female who presented to the ED today with abdominal pain detailed above.    Additional history discussed with patient's family/caregivers.  Complete initial physical exam performed, notably the patient  was actively vomiting.  She had left lower quadrant and left CVA tenderness but abdomen soft, nondistended.    Reviewed and confirmed nursing documentation for past medical history, family history, social history.    Initial Assessment:   With the patient's presentation of abdominal pain, differential diagnosis includes but is not limited to AAA, mesenteric ischemia, appendicitis, diverticulitis, DKA, gastritis, gastroenteritis, AMI, nephrolithiasis, pancreatitis, peritonitis, adrenal insufficiency, intestinal ischemia, constipation, UTI, SBO/LBO, splenic rupture, biliary disease, IBD, IBS, PUD, hepatitis, STD, ovarian/testicular torsion, electrolyte disturbance, DKA, dehydration, acute kidney injury, renal failure, cholecystitis, cholelithiasis, choledocholithiasis, abdominal pain of  unknown etiology, pregnancy, incomplete abortion, septic abortion, threatened abortion, ectopic pregnancy, PID.   Initial Plan:  Screening labs including CBC and Metabolic panel to evaluate for infectious or metabolic etiology of disease.  Lipase to evaluate for pancreatitis Urinalysis with reflex culture ordered to evaluate for UTI or relevant urologic/nephrologic pathology.  CT abd/pelvis to evaluate for intra-abdominal pathology Symptomatic management Objective evaluation as reviewed   Initial Study Results:   Laboratory  All laboratory results reviewed without evidence of clinically relevant pathology.   Exceptions include: WBC 13.2, UA with 21-50 white blood cells  Radiology:  All images reviewed independently. Agree  with radiology report at this time.   CT Renal Stone Study  Result Date: 06/02/2023 CLINICAL DATA:  Left flank pain with nausea and blood in urine. EXAM: CT ABDOMEN AND PELVIS WITHOUT CONTRAST TECHNIQUE: Multidetector CT imaging of the abdomen and pelvis was performed following the standard protocol without IV contrast. RADIATION DOSE REDUCTION: This exam was performed according to the departmental dose-optimization program which includes automated exposure control, adjustment of the mA and/or kV according to patient size and/or use of iterative reconstruction technique. COMPARISON:  CT November 2010 FINDINGS: Lower chest: Lung bases are grossly clear.  No pleural effusion. Hepatobiliary: No focal liver abnormality is seen. Status post cholecystectomy. No biliary dilatation. Pancreas: Unremarkable. No  pancreatic ductal dilatation or surrounding inflammatory changes. Spleen: Normal in size without focal abnormality. Adrenals/Urinary Tract: Adrenal glands are preserved. There is a punctate nonobstructing upper pole right-sided renal stone. No right-sided renal collecting system dilatation. Duplicated right renal collecting system. No ureteral stones on the right. Bladder is underdistended. No left-sided intrarenal collecting system stones. Slight ectasia of the left renal collecting system proximally with a stone in the proximal left ureter best seen on coronal series 5, image 47 with a longitudinal length 3-4 mm. No additional left ureteral stones more distal. Stomach/Bowel: Stomach and small bowel are nondilated on this non oral contrast examination. Large bowel also has a normal course and caliber with scattered stool. Normal retrocecal appendix. Vascular/Lymphatic: Normal caliber aorta and IVC. Circumaortic left renal vein. No developing abnormal lymph node enlargement present in the abdomen and pelvis. Reproductive: Uterus is present. There is a area of macroscopic fat associated with the left ovary measuring  15 mm on series 2, image 62. Possible dermoid. Other: No free intra-abdominal air or free fluid. Musculoskeletal: Mild degenerative changes along the spine. IMPRESSION: 3-4 mm proximal left ureteral stone with mild collecting system dilatation. Punctate nonobstructing right-sided stone. 15 mm area of fat in the left adnexa which may be ovarian and via small ovarian dermoid. Electronically Signed   By: Karen Kays M.D.   On: 06/02/2023 16:40      Consults: Case discussed with Dr. Mena Goes who recommended trying to control patient's pain and nausea and if unable to transfer to [facility] for admission.  Will consult if needed but thinks that stent will be of unlikely benefit given size of stone.  Case discussed with Dr. Leodis Binet, hospitalist, who will admit for observation at [facility].   Final Assessment and Plan:   37 year old female presents to the ED with sudden onset of left flank pain this morning as well as difficult to control nausea.  Afebrile, nontoxic-appearing.  She is actively vomiting on initial exam with left lower quadrant left CVA tenderness.  Previous abdominal surgeries include cholecystectomy.  Labs initiated as above for further evaluation though high suspicion for ureteral stone.  UA does appear acutely infected.  Pregnancy test negative.  Normal kidney function.  Mild leukocytosis.  Following Toradol, morphine, and Dilaudid patient still with increasingly difficult to control pain as well as nausea despite multiple doses of Zofran and Phenergan.  CT scan showed a 3 to 4 mm stone.  No signs of pyelonephritis.  Bladder scan negative for retention.  With difficult to control nausea and pain, consulted urology who recommended admission to [facility] if unable to control.  On reassessment, patient remains actively vomiting, pacing around the room with severe pain.  Discussed option for discharge with close urology follow-up versus admission for pain/nausea control and patient prefers admission  with difficult to control symptoms which I do believe is reasonable.  Given infection on UA, given dose Rocephin.  Discussed with hospital medicine who will admit for ops.  Patient agreeable with plan.  Stable at time of admission.   Clinical Impression:  1. Left ureteral stone   2. Acute cystitis with hematuria   3. Intractable vomiting      Admit           Final Clinical Impression(s) / ED Diagnoses Final diagnoses:  Acute cystitis with hematuria  Left ureteral stone  Intractable vomiting    Rx / DC Orders ED Discharge Orders     None  Tonette Lederer, PA-C 06/02/23 1736    Tonette Lederer, PA-C 06/02/23 1738    Ernie Avena, MD 06/03/23 [phone]

## 2023-06-03 DIAGNOSIS — Z79899 Other long term (current) drug therapy: Secondary | ICD-10-CM | POA: Diagnosis not present

## 2023-06-03 DIAGNOSIS — Z86718 Personal history of other venous thrombosis and embolism: Secondary | ICD-10-CM | POA: Diagnosis not present

## 2023-06-03 DIAGNOSIS — Z881 Allergy status to other antibiotic agents status: Secondary | ICD-10-CM | POA: Diagnosis not present

## 2023-06-03 DIAGNOSIS — Z9049 Acquired absence of other specified parts of digestive tract: Secondary | ICD-10-CM | POA: Diagnosis not present

## 2023-06-03 DIAGNOSIS — N139 Obstructive and reflux uropathy, unspecified: Secondary | ICD-10-CM | POA: Diagnosis present

## 2023-06-03 DIAGNOSIS — F909 Attention-deficit hyperactivity disorder, unspecified type: Secondary | ICD-10-CM | POA: Diagnosis present

## 2023-06-03 DIAGNOSIS — D649 Anemia, unspecified: Secondary | ICD-10-CM | POA: Diagnosis present

## 2023-06-03 DIAGNOSIS — N201 Calculus of ureter: Secondary | ICD-10-CM | POA: Diagnosis present

## 2023-06-03 DIAGNOSIS — J309 Allergic rhinitis, unspecified: Secondary | ICD-10-CM | POA: Diagnosis present

## 2023-06-03 DIAGNOSIS — E876 Hypokalemia: Secondary | ICD-10-CM | POA: Diagnosis present

## 2023-06-03 DIAGNOSIS — F319 Bipolar disorder, unspecified: Secondary | ICD-10-CM | POA: Diagnosis present

## 2023-06-03 DIAGNOSIS — Z87891 Personal history of nicotine dependence: Secondary | ICD-10-CM | POA: Diagnosis not present

## 2023-06-03 DIAGNOSIS — Z887 Allergy status to serum and vaccine status: Secondary | ICD-10-CM | POA: Diagnosis not present

## 2023-06-03 DIAGNOSIS — E871 Hypo-osmolality and hyponatremia: Secondary | ICD-10-CM | POA: Diagnosis present

## 2023-06-03 DIAGNOSIS — E860 Dehydration: Secondary | ICD-10-CM | POA: Diagnosis present

## 2023-06-03 DIAGNOSIS — N3001 Acute cystitis with hematuria: Secondary | ICD-10-CM | POA: Diagnosis present

## 2023-06-03 LAB — BASIC METABOLIC PANEL
Anion gap: 7 (ref 5–15)
BUN: 10 mg/dL (ref 6–20)
CO2: 21 mmol/L — ABNORMAL LOW (ref 22–32)
Calcium: 8.2 mg/dL — ABNORMAL LOW (ref 8.9–10.3)
Chloride: 109 mmol/L (ref 98–111)
Creatinine, Ser: 0.73 mg/dL (ref 0.44–1.00)
GFR, Estimated: >60 mL/min (ref >60)
Glucose, Bld: 95 mg/dL (ref 70–99)
Potassium: 3.3 mmol/L — ABNORMAL LOW (ref 3.5–5.1)
Sodium: 137 mmol/L (ref 135–145)

## 2023-06-03 LAB — CBC WITH DIFFERENTIAL/PLATELET
Abs Immature Granulocytes: 0.04 10*3/uL (ref 0.00–0.07)
Basophils Absolute: 0.1 10*3/uL (ref 0.0–0.1)
Basophils Relative: 1 %
Eosinophils Absolute: 0.1 10*3/uL (ref 0.0–0.5)
Eosinophils Relative: 1 %
HCT: 35.7 % — ABNORMAL LOW (ref 36.0–46.0)
Hemoglobin: 11.8 g/dL — ABNORMAL LOW (ref 12.0–15.0)
Immature Granulocytes: 0 %
Lymphocytes Relative: 42 %
Lymphs Abs: 4 10*3/uL (ref 0.7–4.0)
MCH: 31 pg (ref 26.0–34.0)
MCHC: 33.1 g/dL (ref 30.0–36.0)
MCV: 93.7 fL (ref 80.0–100.0)
Monocytes Absolute: 0.8 10*3/uL (ref 0.1–1.0)
Monocytes Relative: 8 %
Neutro Abs: 4.5 10*3/uL (ref 1.7–7.7)
Neutrophils Relative %: 48 %
Platelets: 268 10*3/uL (ref 150–400)
RBC: 3.81 MIL/uL — ABNORMAL LOW (ref 3.87–5.11)
RDW: 12 % (ref 11.5–15.5)
WBC: 9.5 10*3/uL (ref 4.0–10.5)
nRBC: 0 % (ref 0.0–0.2)

## 2023-06-03 MED ORDER — POTASSIUM CHLORIDE CRYS ER 20 MEQ PO TBCR
40.0000 meq | EXTENDED_RELEASE_TABLET | Freq: Once | ORAL | Status: AC
  Administered 2023-06-03: 40 meq via ORAL
  Filled 2023-06-03: qty 2

## 2023-06-03 MED ORDER — SODIUM CHLORIDE 0.9 % IV SOLN: INTRAVENOUS | Status: DC

## 2023-06-03 NOTE — Plan of Care (Signed)
  Problem: Health Behavior/Discharge Planning: Goal: Ability to manage health-related needs will improve Outcome: Progressing   Problem: Clinical Measurements: Goal: Ability to maintain clinical measurements within normal limits will improve Outcome: Progressing Goal: Respiratory complications will improve Outcome: Progressing Goal: Cardiovascular complication will be avoided Outcome: Progressing   Problem: Activity: Goal: Risk for activity intolerance will decrease Outcome: Progressing   Problem: Nutrition: Goal: Adequate nutrition will be maintained Outcome: Progressing   Problem: Coping: Goal: Level of anxiety will decrease Outcome: Progressing   Problem: Elimination: Goal: Will not experience complications related to urinary retention Outcome: Progressing   Problem: Pain Managment: Goal: General experience of comfort will improve Outcome: Progressing   Problem: Safety: Goal: Ability to remain free from injury will improve Outcome: Progressing   Problem: Skin Integrity: Goal: Risk for impaired skin integrity will decrease Outcome: Progressing

## 2023-06-03 NOTE — Progress Notes (Signed)
   06/03/23 1411  Spiritual Encounters  Type of Visit Initial  Care provided to: Pt and family  Referral source Patient request  Reason for visit Advance directives  OnCall Visit No  Interventions  Spiritual Care Interventions Made Established relationship of care and support;Compassionate presence;Reflective listening;Encouragement  Intervention Outcomes  Outcomes Connection to spiritual care;Awareness of support;Reduced anxiety  Advance Directives (For Healthcare)  Does Patient Have a Medical Advance Directive? No  Would patient like information on creating a medical advance directive? Yes (Inpatient - patient requests chaplain consult to create a medical advance directive)   Chaplain provided education to patient and patient's mother on HCPOA and the living will. Chaplain left paperwork with the patient. Patient will let staff know if/when she is ready to have the paperwork notarized.   Arlyce Dice, Chaplain Resident

## 2023-06-03 NOTE — Progress Notes (Signed)
PROGRESS NOTE  Lauren Gonzalez  DOB: 01/25/1986  PCP: Corwin Levins, MD NWG:[id number]  DOA: 06/02/2023  LOS: 0 days  Hospital Day: 2  Brief narrative: Lauren Gonzalez is a 37 y.o. female with PMH significant for ADHD, bipolar disorder, insomnia, migraine, history of DVT 2020 8/25, patient presented to the ED after sudden onset abdominal pain, severe left flank pain, nausea, vomiting  In the ED, patient was afebrile, hemodynamically stable Labs showed WC count of 13.2 Urinalysis showed cloudy orange-colored urine with large hemoglobin, small leukocytes, negative for bacteria or nitrite Pregnancy test negative CT renal study showed 3-4 mm proximal left ureteral stone with mild collecting system dilatation. Punctate nonobstructing right-sided stone. 15 mm area of fat in the left adnexa which may be ovarian and via small ovarian dermoid.  In the ED, she was given IV pain meds without adequate relief. EDP discussed with urologist Dr. Mena Goes.  Recommended admission for pain management.  He did not believe stent would be of benefit given its size.  Subjective: Patient was seen and examined this morning.  Young Caucasian female.  Sitting up in bed.  Not in distress.  Pain improving.  Husband at bedside. Hemodynamically stable Labs this morning with potassium low at 3.3  Assessment and plan: Acute unilateral obstructive uropathy UTI Presented with sudden onset left flank pain, nausea & vomiting CT findings as above showing 3 to 4 mm proximal left ureteral stone with mild collecting system dilatation EDP discussed with urology, recommended no intervention for now and to monitor symptoms Urinalysis cloudy with small leukocytes.  Currently on IV Rocephin Continue supportive care with IV Toradol, Zofran, Phenergan, Dilaudid  Hypokalemia Potassium level was low at 3.3 this morning.  Replacement given. Recent Labs  Lab 06/02/23 1416 06/03/23 0456  K 3.7 3.3*    Mobility: Encourage  ambulation  Goals of care   Code Status: Full Code     DVT prophylaxis:  SCDs Start: 06/02/23 1947   Antimicrobials: IV Rocephin Fluid: IV NS at 75 mL/h Consultants: None Family Communication: Husband at bedside  Status: Inpatient Level of care:  Med-Surg   Patient is from: Home Needs to continue in-hospital care: Continues to have left flank tenderness Anticipated d/c to: Hopefully home tomorrow      Diet:  Diet Order             Diet regular Room service appropriate? Yes; Fluid consistency: Thin  Diet effective now                   Scheduled Meds:  ketorolac  30 mg Intravenous Q6H    PRN meds: acetaminophen **OR** acetaminophen, HYDROmorphone (DILAUDID) injection, ondansetron (ZOFRAN) IV, oxyCODONE, senna-docusate   Infusions:   sodium chloride 75 mL/hr at 06/03/23 1251   cefTRIAXone (ROCEPHIN)  IV     promethazine (PHENERGAN) injection (IM or IVPB) 12.5 mg (06/03/23 1047)    Antimicrobials: Anti-infectives (From admission, onward)    Start     Dose/Rate Route Frequency Ordered Stop   06/03/23 1500  cefTRIAXone (ROCEPHIN) 1 g in sodium chloride 0.9 % 100 mL IVPB        1 g 200 mL/hr over 30 Minutes Intravenous Every 24 hours 06/02/23 1950     06/02/23 1545  cefTRIAXone (ROCEPHIN) 1 g in sodium chloride 0.9 % 100 mL IVPB        1 g 200 mL/hr over 30 Minutes Intravenous  Once 06/02/23 1537 06/02/23 1628       Nutritional  status:  Body mass index is 29.49 kg/m.          Objective: Vitals:   06/03/23 0631 06/03/23 1201  BP: 113/66 124/77  Pulse: 80 90  Resp: 18 20  Temp: 98.7 F (37.1 C) 98.3 F (36.8 C)  SpO2: 99% 99%    Intake/Output Summary (Last 24 hours) at 06/03/2023 1332 Last data filed at 06/03/2023 1058 Gross per 24 hour  Intake 2362.85 ml  Output 100 ml  Net 2262.85 ml   Filed Weights   06/02/23 2036  Weight: 80.4 kg   Weight change:  Body mass index is 29.49 kg/m.   Physical Exam: General exam: Pleasant,  young Caucasian female. Skin: No rashes, lesions or ulcers. HEENT: Atraumatic, normocephalic, no obvious bleeding Lungs: Clear to auscultation bilaterally CVS: Regular rate and rhythm, no murmur GI/Abd soft, nontender, bowel sound present.  Left flank tenderness present CNS: Alert, awake, oriented x 3 Psychiatry: Mood appropriate Extremities: No pedal edema, no calf tenderness  Data Review: I have personally reviewed the laboratory data and studies available.  F/u labs ordered Unresulted Labs (From admission, onward)     Start     Ordered   Unscheduled  CBC with Differential/Platelet  Tomorrow morning,   R       Question:  Specimen collection method  Answer:  Lab=Lab collect   06/03/23 1332   Unscheduled  Basic metabolic panel  Tomorrow morning,   R       Question:  Specimen collection method  Answer:  Lab=Lab collect   06/03/23 1332            Total time spent in review of labs and imaging, patient evaluation, formulation of plan, documentation and communication with family: 55 minutes  Signed, Lorin Glass, MD [facility] 06/03/2023

## 2023-06-04 ENCOUNTER — Inpatient Hospital Stay (HOSPITAL_COMMUNITY): Payer: BC Managed Care – PPO | Admitting: Anesthesiology

## 2023-06-04 ENCOUNTER — Encounter (HOSPITAL_COMMUNITY): Payer: Self-pay | Admitting: Internal Medicine

## 2023-06-04 ENCOUNTER — Inpatient Hospital Stay (HOSPITAL_COMMUNITY): Payer: BC Managed Care – PPO

## 2023-06-04 ENCOUNTER — Ambulatory Visit: Admit: 2023-06-04 | Payer: BC Managed Care – PPO | Admitting: Urology

## 2023-06-04 ENCOUNTER — Encounter (HOSPITAL_COMMUNITY)
Admission: EM | Disposition: A | Discharge status: Home / Self Care | Payer: Self-pay | Source: Home / Self Care | Attending: Internal Medicine

## 2023-06-04 ENCOUNTER — Other Ambulatory Visit: Payer: Self-pay

## 2023-06-04 DIAGNOSIS — N139 Obstructive and reflux uropathy, unspecified: Secondary | ICD-10-CM | POA: Diagnosis not present

## 2023-06-04 HISTORY — PX: CYSTOSCOPY WITH RETROGRADE PYELOGRAM, URETEROSCOPY AND STENT PLACEMENT: SHX5789

## 2023-06-04 LAB — BASIC METABOLIC PANEL
Anion gap: 3 — ABNORMAL LOW (ref 5–15)
BUN: 9 mg/dL (ref 6–20)
CO2: 22 mmol/L (ref 22–32)
Calcium: 7.8 mg/dL — ABNORMAL LOW (ref 8.9–10.3)
Chloride: 109 mmol/L (ref 98–111)
Creatinine, Ser: 0.7 mg/dL (ref 0.44–1.00)
GFR, Estimated: >60 mL/min (ref >60)
Glucose, Bld: 97 mg/dL (ref 70–99)
Potassium: 4 mmol/L (ref 3.5–5.1)
Sodium: 134 mmol/L — ABNORMAL LOW (ref 135–145)

## 2023-06-04 LAB — CBC WITH DIFFERENTIAL/PLATELET
Abs Immature Granulocytes: 0 10*3/uL (ref 0.00–0.07)
Basophils Absolute: 0 10*3/uL (ref 0.0–0.1)
Basophils Relative: 1 %
Eosinophils Absolute: 0.3 10*3/uL (ref 0.0–0.5)
Eosinophils Relative: 4 %
HCT: 32.8 % — ABNORMAL LOW (ref 36.0–46.0)
Hemoglobin: 10.6 g/dL — ABNORMAL LOW (ref 12.0–15.0)
Immature Granulocytes: 0 %
Lymphocytes Relative: 59 %
Lymphs Abs: 3.4 10*3/uL (ref 0.7–4.0)
MCH: 31.4 pg (ref 26.0–34.0)
MCHC: 32.3 g/dL (ref 30.0–36.0)
MCV: 97 fL (ref 80.0–100.0)
Monocytes Absolute: 0.4 10*3/uL (ref 0.1–1.0)
Monocytes Relative: 6 %
Neutro Abs: 1.7 10*3/uL (ref 1.7–7.7)
Neutrophils Relative %: 30 %
Platelets: 226 10*3/uL (ref 150–400)
RBC: 3.38 MIL/uL — ABNORMAL LOW (ref 3.87–5.11)
RDW: 12.3 % (ref 11.5–15.5)
WBC: 5.8 10*3/uL (ref 4.0–10.5)
nRBC: 0 % (ref 0.0–0.2)

## 2023-06-04 LAB — SURGICAL PCR SCREEN
MRSA, PCR: NEGATIVE
Staphylococcus aureus: NEGATIVE

## 2023-06-04 SURGERY — CYSTOURETEROSCOPY, WITH RETROGRADE PYELOGRAM AND STENT INSERTION
Anesthesia: General | Laterality: Left

## 2023-06-04 MED ORDER — KETOROLAC TROMETHAMINE 10 MG PO TABS
10.0000 mg | ORAL_TABLET | Freq: Three times a day (TID) | ORAL | Status: DC
  Administered 2023-06-04 – 2023-06-05 (×2): 10 mg via ORAL
  Filled 2023-06-04 (×4): qty 1

## 2023-06-04 MED ORDER — DEXAMETHASONE SODIUM PHOSPHATE 10 MG/ML IJ SOLN
INTRAMUSCULAR | Status: AC
  Filled 2023-06-04: qty 1

## 2023-06-04 MED ORDER — FENTANYL CITRATE PF 50 MCG/ML IJ SOSY
PREFILLED_SYRINGE | INTRAMUSCULAR | Status: AC
  Administered 2023-06-04: 50 ug via INTRAVENOUS
  Filled 2023-06-04: qty 2

## 2023-06-04 MED ORDER — 0.9 % SODIUM CHLORIDE (POUR BTL) OPTIME
TOPICAL | Status: DC | PRN
  Administered 2023-06-04: 1000 mL

## 2023-06-04 MED ORDER — ORAL CARE MOUTH RINSE: 15.0000 mL | Freq: Once | OROMUCOSAL | Status: AC

## 2023-06-04 MED ORDER — IOHEXOL 300 MG/ML  SOLN
INTRAMUSCULAR | Status: DC | PRN
  Administered 2023-06-04: 6 mL

## 2023-06-04 MED ORDER — FENTANYL CITRATE PF 50 MCG/ML IJ SOSY
PREFILLED_SYRINGE | INTRAMUSCULAR | Status: AC
  Filled 2023-06-04: qty 1

## 2023-06-04 MED ORDER — MIDAZOLAM HCL 5 MG/5ML IJ SOLN
INTRAMUSCULAR | Status: DC | PRN
  Administered 2023-06-04: 2 mg via INTRAVENOUS

## 2023-06-04 MED ORDER — FENTANYL CITRATE (PF) 100 MCG/2ML IJ SOLN
INTRAMUSCULAR | Status: DC | PRN
  Administered 2023-06-04: 50 ug via INTRAVENOUS
  Administered 2023-06-04 (×2): 25 ug via INTRAVENOUS

## 2023-06-04 MED ORDER — SODIUM CHLORIDE 0.9 % IR SOLN
Status: DC | PRN
  Administered 2023-06-04: 3000 mL

## 2023-06-04 MED ORDER — OXYCODONE HCL 5 MG/5ML PO SOLN: 5.0000 mg | Freq: Once | ORAL | Status: DC | PRN

## 2023-06-04 MED ORDER — DEXAMETHASONE SODIUM PHOSPHATE 4 MG/ML IJ SOLN
INTRAMUSCULAR | Status: DC | PRN
  Administered 2023-06-04: 10 mg via INTRAVENOUS

## 2023-06-04 MED ORDER — FENTANYL CITRATE PF 50 MCG/ML IJ SOSY
25.0000 ug | PREFILLED_SYRINGE | INTRAMUSCULAR | Status: DC | PRN
  Administered 2023-06-04 (×2): 50 ug via INTRAVENOUS

## 2023-06-04 MED ORDER — CEFADROXIL 500 MG PO CAPS
1000.0000 mg | ORAL_CAPSULE | Freq: Two times a day (BID) | ORAL | Status: DC
  Administered 2023-06-04 – 2023-06-05 (×3): 1000 mg via ORAL
  Filled 2023-06-04 (×4): qty 2

## 2023-06-04 MED ORDER — FENTANYL CITRATE (PF) 100 MCG/2ML IJ SOLN
INTRAMUSCULAR | Status: AC
  Filled 2023-06-04: qty 2

## 2023-06-04 MED ORDER — PROPOFOL 10 MG/ML IV BOLUS
INTRAVENOUS | Status: DC | PRN
  Administered 2023-06-04: 150 mg via INTRAVENOUS

## 2023-06-04 MED ORDER — LACTATED RINGERS IV SOLN: INTRAVENOUS | Status: DC

## 2023-06-04 MED ORDER — LIDOCAINE HCL (CARDIAC) PF 100 MG/5ML IV SOSY
PREFILLED_SYRINGE | INTRAVENOUS | Status: DC | PRN
  Administered 2023-06-04: 100 mg via INTRAVENOUS

## 2023-06-04 MED ORDER — OXYCODONE HCL 5 MG PO TABS: 5.0000 mg | ORAL_TABLET | Freq: Once | ORAL | Status: DC | PRN

## 2023-06-04 MED ORDER — MIDAZOLAM HCL 2 MG/2ML IJ SOLN
INTRAMUSCULAR | Status: AC
  Filled 2023-06-04: qty 2

## 2023-06-04 MED ORDER — ONDANSETRON HCL 4 MG/2ML IJ SOLN
INTRAMUSCULAR | Status: DC | PRN
  Administered 2023-06-04: 4 mg via INTRAVENOUS

## 2023-06-04 MED ORDER — ACETAMINOPHEN 10 MG/ML IV SOLN: 1000.0000 mg | Freq: Once | INTRAVENOUS | Status: DC | PRN

## 2023-06-04 MED ORDER — ONDANSETRON HCL 4 MG/2ML IJ SOLN: 4.0000 mg | Freq: Once | INTRAMUSCULAR | Status: DC | PRN

## 2023-06-04 MED ORDER — CHLORHEXIDINE GLUCONATE 0.12 % MT SOLN
15.0000 mL | Freq: Once | OROMUCOSAL | Status: AC
  Administered 2023-06-04: 15 mL via OROMUCOSAL

## 2023-06-04 SURGICAL SUPPLY — 23 items
BAG URO CATCHER STRL LF (MISCELLANEOUS) ×1 IMPLANT
BASKET LASER NITINOL 1.9FR (BASKET) IMPLANT
BSKT STON RTRVL 120 1.9FR (BASKET)
CATH URETL OPEN END 6FR 70 (CATHETERS) ×1 IMPLANT
CLOTH BEACON ORANGE TIMEOUT ST (SAFETY) ×1 IMPLANT
EXTRACTOR STONE 1.7FRX115CM (UROLOGICAL SUPPLIES) IMPLANT
GLOVE SURG LX STRL 7.5 STRW (GLOVE) ×1 IMPLANT
GOWN STRL REUS W/ TWL XL LVL3 (GOWN DISPOSABLE) ×1 IMPLANT
GOWN STRL REUS W/TWL XL LVL3 (GOWN DISPOSABLE) ×1
GUIDEWIRE ANG ZIPWIRE 038X150 (WIRE) ×1 IMPLANT
GUIDEWIRE STR DUAL SENSOR (WIRE) ×1 IMPLANT
KIT TURNOVER KIT A (KITS) IMPLANT
LASER FIB FLEXIVA PULSE ID 365 (Laser) IMPLANT
MANIFOLD NEPTUNE II (INSTRUMENTS) ×1 IMPLANT
PACK CYSTO (CUSTOM PROCEDURE TRAY) ×1 IMPLANT
SHEATH NAVIGATOR HD 11/13X28 (SHEATH) IMPLANT
SHEATH NAVIGATOR HD 11/13X36 (SHEATH) IMPLANT
STENT POLARIS 5FRX24 (STENTS) IMPLANT
TRACTIP FLEXIVA PULS ID 200XHI (Laser) IMPLANT
TRACTIP FLEXIVA PULSE ID 200 (Laser)
TUBE PU 8FR 16IN ENFIT (TUBING) ×1 IMPLANT
TUBING CONNECTING 10 (TUBING) ×1 IMPLANT
TUBING UROLOGY SET (TUBING) ×1 IMPLANT

## 2023-06-04 NOTE — Progress Notes (Signed)
Day of Surgery   Subjective/Chief Complaint:  1 - Left Ureteral Stone - 3mm left proximal stone on ER CT on eval flank pain and nausea. Colic difficult to control. Admitted for trial of medical therapy 8/25 but failure to pass stone still with challenging colic. UA without infectious parameters, Cr 0.7.  PMH sig for lap chole, ENT surgery x several. She is paramedic for [employer], used to work in [employer] OR. Her mother [name] is also a stone patient of mine.   Today "Zamantha" is seen to proceed with LEFT ureteroscopy for proximal stone that has failed to pass with medical therapy and colic that remains difficult to control.   Objective: Vital signs in last 24 hours: Temp:  [98.1 F (36.7 C)-99.7 F (37.6 C)] 99.7 F (37.6 C) (08/27 1254) Pulse Rate:  [76-85] 79 (08/27 1254) Resp:  [18-20] 18 (08/27 1254) BP: (106-143)/(80-89) 143/89 (08/27 1254) SpO2:  [99 %-100 %] 100 % (08/27 1254) Last BM Date : 06/01/23  Intake/Output from previous day: 08/26 0701 - 08/27 0700 In: 888.9 [I.V.:688.9; IV Piggyback:200] Out: 1300 [Urine:1300] Intake/Output this shift: Total I/O In: -  Out: 1 [Emesis/NG output:1]  NAD, AOx3. Mom at bedside.  Non-labored breathing on RA RRR SNTND Mild left CVAT at present No c/c/e  Lab Results:  Recent Labs    06/03/23 0456 06/04/23 0413  WBC 9.5 5.8  HGB 11.8* 10.6*  HCT 35.7* 32.8*  PLT 268 226   BMET Recent Labs    06/03/23 0456 06/04/23 0413  NA 137 134*  K 3.3* 4.0  CL 109 109  CO2 21* 22  GLUCOSE 95 97  BUN 10 9  CREATININE 0.73 0.70  CALCIUM 8.2* 7.8*   PT/INR No results for input(s): "LABPROT", "INR" in the last 72 hours. ABG No results for input(s): "PHART", "HCO3" in the last 72 hours.  Invalid input(s): "PCO2", "PO2"  Studies/Results: No results found.  Anti-infectives: Anti-infectives (From admission, onward)    Start     Dose/Rate Route Frequency Ordered Stop   06/04/23 1000  [MAR Hold]  cefadroxil (DURICEF)  capsule 1,000 mg        (MAR Hold since Tue 06/04/2023 at 1539.Hold Reason: Transfer to a Procedural area)   1,000 mg Oral 2 times daily 06/04/23 0757 06/09/23 0959   06/03/23 1500  cefTRIAXone (ROCEPHIN) 1 g in sodium chloride 0.9 % 100 mL IVPB  Status:  Discontinued        1 g 200 mL/hr over 30 Minutes Intravenous Every 24 hours 06/02/23 1950 06/04/23 0757   06/02/23 1545  cefTRIAXone (ROCEPHIN) 1 g in sodium chloride 0.9 % 100 mL IVPB        1 g 200 mL/hr over 30 Minutes Intravenous  Once 06/02/23 1537 06/02/23 1628       Assessment/Plan:  Proceed as planned with LEFT ureteroscopic stone manipulation. Risks, benefits, alternatives, expected peri-op course discussed in detail including possible bled for peri-op stent or staged approach.    Loletta Parish. 06/04/2023

## 2023-06-04 NOTE — Op Note (Signed)
NAME: Lauren Gonzalez, Lauren Gonzalez MEDICAL RECORD NO: [id number] ACCOUNT NO: [id number] DATE OF BIRTH: 08-12-1986 FACILITY: [facility] LOCATION: [id number] PHYSICIAN: Sebastian Ache, MD  Operative Report   DATE OF PROCEDURE: 06/04/2023  PREOPERATIVE DIAGNOSIS:  Left proximal ureteral stone.  PROCEDURE PERFORMED:   1.  Cystoscopy, left retrograde pyelogram and interpretation. 2.  Left diagnostic ureteroscopy. 3.  Insertion of left ureteral stent.  ESTIMATED BLOOD LOSS:  Nil.  COMPLICATIONS:  None.  SPECIMEN:  None.  FINDINGS:   1.  Persistent left proximal ureteral stone with mild hydronephrosis by retrograde pyelogram. 2.  Relative narrowing, but without obvious stricturing of the left proximal ureter, inability to safely visualize stone. 3.  Successful placement of left ureteral stent, proximal end in the renal pelvis, distal end in the urinary bladder.  INDICATIONS:  The patient is a 37 year old lady who was found to have colicky flank pain and nausea, have a relatively small left proximal ureteral stone that ultimately only around 3 mm or so.  Her colic was quite difficult to manage given the small  size of her stone.  She was admitted for medical therapy.  She has failed to pass her stone and colic does remain difficult to control.  Options were discussed including continued medical management versus shockwave lithotripsy versus ureteroscopy and  she wished to proceed with ureteroscopy.  Informed consent was obtained and placed in medical record.  PROCEDURE DETAILS:  The patient being Lauren Gonzalez verified and procedure being left ureteroscopic stone manipulation was confirmed.  Procedure timeout was performed.  Intravenous administered.  General LMA anesthesia induced.  The patient was placed  into a low lithotomy position.  Sterile field was created, prepped and draped the patient's vagina, introitus, and proximal thighs using iodine.  Cystourethroscopy was performed using 21-French rigid cystoscope  with lens.  Inspection of urinary bladder  revealed no diverticula, calcifications, papillary lesions.  Ureteral orifices appeared single.  Left ureteral orifice was cannulated with 6-French end-hole catheter, and a left retrograde pyelogram was obtained.  Left retrograde pyelogram demonstrated a single left ureter, single system left kidney.  There was a focal filling defect in the proximal ureter consistent with known stone.  There was mild hydronephrosis proximal to this.  A 0.038 ZIPwire was advanced  to the level of the upper pole, set aside as a safety wire.  An 8-French feeding tube placed in the urinary bladder for pressure release and semirigid ureteroscopy performed of the distal orifice of the left ureter alongside a separate sensor working  wire.  No mucositis were found.  The semirigid scope was unable to visualize the stone, but radiographically appeared very close.  As such, the semirigid scope was exchanged for a short length ureteral access sheath to the level of proximal ureter using  continuous fluoroscopic guidance and flexible digital ureteroscopy was performed of proximal left ureter.  This was able to visualize a bit more proximal to where the semirigid scope had reached and in the upper fifth of the ureter, there was noted to be  some relative narrowing, but without focal stricturing and estimated size only about 7-French in terms of lumen and this would not safely accommodate even the single channel small caliber ureteroscope and safety wire, as such it was felt that safest  means of proceeding would be to place a stent today, allow for passive dilation and then reattempt ureteroscopy in the elective setting in several weeks.  As such, the access sheath was removed under continuous vision and a new 5 x  24 Polaris type stent  was carefully placed using fluoroscopic guidance.  Good proximal and distal planes were noted.  Bladder was emptied per cystoscope.  Procedure was then  terminated.  The patient tolerated the procedure well, no immediate periprocedural complications.  The  patient taken to postanesthesia care unit in stable condition.  Plan for continued inpatient admission, likely discharge home tomorrow.  We will request outpatient ureteroscopy in approximately 3-4 weeks in elective setting.   VAI D: 06/04/2023 6:38:52 pm T: 06/04/2023 8:48:00 pm  JOB: [id number]/ [id number]

## 2023-06-04 NOTE — Anesthesia Procedure Notes (Signed)
Procedure Name: LMA Insertion Date/Time: 06/04/2023 6:12 PM  Performed by: Theodosia Quay, CRNAPre-anesthesia Checklist: Patient identified, Emergency Drugs available, Suction available, Patient being monitored and Timeout performed Patient Re-evaluated:Patient Re-evaluated prior to induction Oxygen Delivery Method: Circle system utilized Preoxygenation: Pre-oxygenation with 100% oxygen Induction Type: IV induction Ventilation: Mask ventilation without difficulty LMA: LMA inserted LMA Size: 4.0 Tube size: 4.0 mm Number of attempts: 1 Placement Confirmation: positive ETCO2 and breath sounds checked- equal and bilateral Tube secured with: Tape Dental Injury: Teeth and Oropharynx as per pre-operative assessment

## 2023-06-04 NOTE — Anesthesia Preprocedure Evaluation (Signed)
Anesthesia Evaluation  Patient identified by MRN, date of birth, ID band Patient awake    Reviewed: Allergy & Precautions, NPO status , Patient's Chart, lab work & pertinent test results, reviewed documented beta blocker date and time   History of Anesthesia Complications Negative for: history of anesthetic complications  Airway Mallampati: I  TM Distance: >3 FB     Dental no notable dental hx.    Pulmonary neg sleep apnea, neg COPD, neg recent URI, former smoker   breath sounds clear to auscultation       Cardiovascular (-) hypertension(-) CAD, (-) Past MI and (-) CABG (-) dysrhythmias (-) pacemaker(-) Cardiac Defibrillator (-) Valvular Problems/Murmurs Rhythm:Regular Rate:Normal     Neuro/Psych  Headaches, neg Seizures PSYCHIATRIC DISORDERS Anxiety  Bipolar Disorder      GI/Hepatic ,neg GERD  ,,(+) neg Cirrhosis        Endo/Other    Renal/GU Renal disease (stone disease)     Musculoskeletal   Abdominal   Peds  Hematology  (+) Blood dyscrasia, anemia   Anesthesia Other Findings   Reproductive/Obstetrics                              Anesthesia Physical Anesthesia Plan  ASA: 2  Anesthesia Plan: General   Post-op Pain Management:    Induction: Intravenous  PONV Risk Score and Plan: 1 and Ondansetron  Airway Management Planned: LMA  Additional Equipment:   Intra-op Plan:   Post-operative Plan: Extubation in OR  Informed Consent: I have reviewed the patients History and Physical, chart, labs and discussed the procedure including the risks, benefits and alternatives for the proposed anesthesia with the patient or authorized representative who has indicated his/her understanding and acceptance.     Dental advisory given  Plan Discussed with: CRNA  Anesthesia Plan Comments:          Anesthesia Quick Evaluation

## 2023-06-04 NOTE — Brief Op Note (Signed)
06/04/2023  6:33 PM  PATIENT:  Lauren Gonzalez  37 y.o. female  PRE-OPERATIVE DIAGNOSIS:  LEFT URETERAL STONE  POST-OPERATIVE DIAGNOSIS:  LEFT URETERAL STONE  PROCEDURE:  Procedure(s): CYSTOSCOPY WITH RETROGRADE PYELOGRAM, DIAGNOSTIC URETEROSCOPY AND STENT PLACEMENT (Left)  SURGEON:  Surgeons and Role:    * Rober Skeels, Delbert Phenix., MD - Primary  PHYSICIAN ASSISTANT:   ASSISTANTS: none   ANESTHESIA:   general  EBL:  minimal   BLOOD ADMINISTERED:none  DRAINS: none   LOCAL MEDICATIONS USED:  NONE  SPECIMEN:  No Specimen  DISPOSITION OF SPECIMEN:  N/A  COUNTS:  YES  TOURNIQUET:  * No tourniquets in log *  DICTATION: .Other Dictation: Dictation Number [id number]  PLAN OF CARE: Admit to inpatient   PATIENT DISPOSITION:  PACU - hemodynamically stable.   Delay start of Pharmacological VTE agent (>24hrs) due to surgical blood loss or risk of bleeding: yes

## 2023-06-04 NOTE — Plan of Care (Signed)
  Problem: Health Behavior/Discharge Planning: Goal: Ability to manage health-related needs will improve Outcome: Progressing   Problem: Clinical Measurements: Goal: Respiratory complications will improve Outcome: Progressing Goal: Cardiovascular complication will be avoided Outcome: Progressing   Problem: Activity: Goal: Risk for activity intolerance will decrease Outcome: Progressing   Problem: Nutrition: Goal: Adequate nutrition will be maintained Outcome: Progressing   Problem: Coping: Goal: Level of anxiety will decrease Outcome: Progressing   Problem: Elimination: Goal: Will not experience complications related to bowel motility Outcome: Progressing Goal: Will not experience complications related to urinary retention Outcome: Progressing   Problem: Safety: Goal: Ability to remain free from injury will improve Outcome: Progressing

## 2023-06-04 NOTE — Progress Notes (Signed)
PROGRESS NOTE  Lauren Gonzalez  DOB: January 20, 1986  PCP: Corwin Levins, MD TOI:[id number]  DOA: 06/02/2023  LOS: 1 day  Hospital Day: 3  Brief narrative: Lauren Gonzalez is a 37 y.o. female with PMH significant for ADHD, bipolar disorder, insomnia, migraine, history of DVT 2020 8/25, patient presented to the ED after sudden onset abdominal pain, severe left flank pain, nausea, vomiting  In the ED, patient was afebrile, hemodynamically stable Labs showed WC count of 13.2 Urinalysis showed cloudy orange-colored urine with large hemoglobin, small leukocytes, negative for bacteria or nitrite Pregnancy test negative CT renal study showed 3-4 mm proximal left ureteral stone with mild collecting system dilatation. Punctate nonobstructing right-sided stone. 15 mm area of fat in the left adnexa which may be ovarian and via small ovarian dermoid.  In the ED, she was given IV pain meds without adequate relief. EDP discussed with urologist Dr. Mena Goes.  Recommended admission for pain management.  He did not believe stent would be of benefit given its size.  Subjective: Patient was seen and examined this morning.  Sitting up in bed.  Reports significant bout of pain this morning again requiring IV Dilaudid.  Remains tender on the left flank.   Assessment and plan: Acute unilateral obstructive uropathy UTI Presented with sudden onset left flank pain, nausea & vomiting CT findings as above showing 3 to 4 mm proximal left ureteral stone with mild collecting system dilatation EDP discussed with urology, recommended no intervention for now and to monitor symptoms Urinalysis cloudy with small leukocytes.  She has started on supportive care with IV Toradol, Zofran, Phenergan, Dilaudid. Continues to have episodes of pain left flank tenderness.  Consulted urology this morning. For UTI, still receiving IV Rocephin.  I switched her to oral cefadroxil this morning.  Hypokalemia Potassium level was low at  3.3 this morning.  Replacement given. Recent Labs  Lab 06/02/23 1416 06/03/23 0456 06/04/23 0413  K 3.7 3.3* 4.0   Mild hyponatremia Sodium level slightly low at 134 today.  Probably due to vomiting and dehydration.  Continue monitor. Recent Labs  Lab 06/02/23 1416 06/03/23 0456 06/04/23 0413  NA 137 137 134*    Mild anemia No active blood loss.  Reproductive age group female. Continue to monitor Recent Labs    06/02/23 1416 06/03/23 0456 06/04/23 0413  HGB 13.7 11.8* 10.6*  MCV 90.3 93.7 97.0   Mobility: Encourage ambulation  Goals of care   Code Status: Full Code     DVT prophylaxis:  SCDs Start: 06/02/23 1947   Antimicrobials: IV Rocephin Fluid: IV NS at 75 mL/h to continue Consultants: None Family Communication: Husband not at bedside today  Status: Inpatient Level of care:  Med-Surg   Patient is from: Home Needs to continue in-hospital care: Continues to have left flank tenderness Anticipated d/c to: Pending urology evaluation      Diet:  Diet Order             Diet regular Room service appropriate? Yes; Fluid consistency: Thin  Diet effective now                   Scheduled Meds:  cefadroxil  1,000 mg Oral BID    PRN meds: acetaminophen **OR** acetaminophen, HYDROmorphone (DILAUDID) injection, ondansetron (ZOFRAN) IV, oxyCODONE, senna-docusate   Infusions:   sodium chloride 75 mL/hr at 06/03/23 1251    Antimicrobials: Anti-infectives (From admission, onward)    Start     Dose/Rate Route Frequency Ordered Stop  06/04/23 1000  cefadroxil (DURICEF) capsule 1,000 mg        1,000 mg Oral 2 times daily 06/04/23 0757 06/09/23 0959   06/03/23 1500  cefTRIAXone (ROCEPHIN) 1 g in sodium chloride 0.9 % 100 mL IVPB  Status:  Discontinued        1 g 200 mL/hr over 30 Minutes Intravenous Every 24 hours 06/02/23 1950 06/04/23 0757   06/02/23 1545  cefTRIAXone (ROCEPHIN) 1 g in sodium chloride 0.9 % 100 mL IVPB        1 g 200 mL/hr over  30 Minutes Intravenous  Once 06/02/23 1537 06/02/23 1628       Nutritional status:  Body mass index is 29.49 kg/m.          Objective: Vitals:   06/03/23 2200 06/04/23 0424  BP: 106/82 118/80  Pulse: 76 85  Resp: 20 18  Temp: 98.3 F (36.8 C) 98.1 F (36.7 C)  SpO2: 99% 99%    Intake/Output Summary (Last 24 hours) at 06/04/2023 1054 Last data filed at 06/04/2023 1013 Gross per 24 hour  Intake 608.24 ml  Output 1301 ml  Net -692.76 ml   Filed Weights   06/02/23 2036  Weight: 80.4 kg   Weight change:  Body mass index is 29.49 kg/m.   Physical Exam: General exam: Pleasant, young Caucasian female. Skin: No rashes, lesions or ulcers. HEENT: Atraumatic, normocephalic, no obvious bleeding Lungs: Clear to auscultation bilaterally CVS: Regular rate and rhythm, no murmur GI/Abd soft, nontender, bowel sound present.  Left flank tenderness present CNS: Alert, awake, oriented x 3 Psychiatry: Mood appropriate Extremities: No pedal edema, no calf tenderness  Data Review: I have personally reviewed the laboratory data and studies available.  F/u labs ordered Unresulted Labs (From admission, onward)    None       Total time spent in review of labs and imaging, patient evaluation, formulation of plan, documentation and communication with family: 45 minutes  Signed, Lorin Glass, MD [facility] 06/04/2023

## 2023-06-04 NOTE — Progress Notes (Signed)
[facility] Pre-op Review  Pre-op checklist: asked RN to complete  NPO:   Labs: HGB 10.6, HCG Negative  Consent: has orders, consent is signed  H&P: 8/25, update today   Vitals: stable  O2 requirements: none  MAR/PTA review: dilaudid  IV: 20 G IV right forearm  Floor nurse name:  Victorino Dike  Additional info:  The Patient was made NPO at 12:25 pm. She has an oxycodone for pain with just a sip of water at 12:22 pm. She had a little breakfast and then vomited and has IV Zofran at 10:13

## 2023-06-04 NOTE — TOC CM/SW Note (Signed)
Transition of Care [facility]) - Inpatient Brief Assessment   Patient Details  Name: Lauren Gonzalez MRN: [id number] Date of Birth: May 09, 1986  Transition [facility] CM/SW Contact:    Larrie Kass, LCSW Phone Number: 06/04/2023, 11:11 AM   Clinical Narrative:    Transition of Care Asessment: Insurance and Status: Insurance coverage has been reviewed Patient has primary care physician: Yes Home environment has been reviewed: home/self Prior level of function:: independent Prior/Current Home Services: No current home services Social Determinants of Health Reivew: SDOH reviewed no interventions necessary Readmission risk has been reviewed: Yes Transition of care needs: no transition of care needs at this time

## 2023-06-04 NOTE — Progress Notes (Signed)
Chaplain assisted Takara with having her advance care planning documents notarized and witnessed.  A copy was placed in her chart and another was scanned to [facility].  The original was returned to her.  Chaplain encouraged her to speak with her healthcare proxies to let them know of her wishes.  She stated that they were aware. She was very grateful and feels that, while  this has been taxing on her patience, spiritually--she is right where she needs to be. She shared about her family and about her health journey.  She is aware of ongoing availability for support if she would like to talk further.    [address] [place] Pager, [phone]

## 2023-06-04 NOTE — Transfer of Care (Signed)
Immediate Anesthesia Transfer of Care Note  Patient: Lauren Gonzalez  Procedure(s) Performed: Procedure(s): CYSTOSCOPY WITH RETROGRADE PYELOGRAM, DIAGNOSTIC URETEROSCOPY AND STENT PLACEMENT (Left)  Patient Location: PACU  Anesthesia Type:General  Level of Consciousness:  sedated, patient cooperative and responds to stimulation  Airway & Oxygen Therapy:Patient Spontanous Breathing and Patient connected to face mask oxgen  Post-op Assessment:  Report given to PACU RN and Post -op Vital signs reviewed and stable  Post vital signs:  Reviewed and stable  Last Vitals:  Vitals:   06/04/23 1254 06/04/23 1543  BP: (!) 143/89 135/81  Pulse: 79 77  Resp: 18 18  Temp: 37.6 C 36.7 C  SpO2: 100% 98%    Complications: No apparent anesthesia complications

## 2023-06-04 NOTE — Discharge Instructions (Signed)
1 - You may have urinary urgency (bladder spasms) and bloody urine on / off with stent in place. This is normal. ° °2 - Call MD or go to ER for fever >102, severe pain / nausea / vomiting not relieved by medications, or acute change in medical status ° °

## 2023-06-05 ENCOUNTER — Encounter (HOSPITAL_COMMUNITY): Payer: Self-pay | Admitting: Urology

## 2023-06-05 DIAGNOSIS — N139 Obstructive and reflux uropathy, unspecified: Secondary | ICD-10-CM | POA: Diagnosis not present

## 2023-06-05 MED ORDER — OXYCODONE HCL 5 MG PO TABS: 5.0000 mg | ORAL_TABLET | Freq: Three times a day (TID) | ORAL | 0 refills | Status: AC | PRN

## 2023-06-05 NOTE — Discharge Summary (Signed)
Physician Discharge Summary  Lauren Gonzalez ZOX:[id number] DOB: 1986/01/02 DOA: 06/02/2023  PCP: Corwin Levins, MD  Admit date: 06/02/2023 Discharge date: 06/05/2023  Admitted From: Home Discharge disposition: Home  Recommendations at discharge:  Judicious use of pain medicines.  Prescription given for limited supply. Follow-up with urology as an outpatient for definitive stone management in few weeks   Brief narrative: Lauren Gonzalez is a 38 y.o. female with PMH significant for ADHD, bipolar disorder, insomnia, migraine, history of DVT 2020 8/25, patient presented to the ED after sudden onset abdominal pain, severe left flank pain, nausea, vomiting  In the ED, patient was afebrile, hemodynamically stable Labs showed WC count of 13.2 Urinalysis showed cloudy orange-colored urine with large hemoglobin, small leukocytes, negative for bacteria or nitrite Pregnancy test negative CT renal study showed 3-4 mm proximal left ureteral stone with mild collecting system dilatation. Punctate nonobstructing right-sided stone. 15 mm area of fat in the left adnexa which may be ovarian and via small ovarian dermoid.  In the ED, she was given IV pain meds without adequate relief. EDP discussed with urologist Dr. Mena Goes.  Recommended admission for pain management.  He did not believe stent would be of benefit given its size. Patient was hospitalized in started on conservative management with IV fluid, IV pain meds.  However her symptoms did not improve in the next 48 hours.  Urology was formally consulted. 8/24, patient underwent cystoscopy, ureteroscopy and insertion of left ureteral stone. Symptoms much improved and ready for discharge today 8/28  Subjective: Patient was seen and examined this morning.  Feels better.  Feels ready to go home.  Assessment and plan: Acute unilateral obstructive uropathy UTI Presented with sudden onset left flank pain, nausea & vomiting CT findings as above  showing 3 to 4 mm proximal left ureteral stone with mild collecting system dilatation EDP discussed with urologist Dr. Mena Goes.  Recommended admission for pain management.  He did not believe stent would be of benefit given its size. Patient was hospitalized in started on conservative management with IV fluid, IV pain meds.  However her symptoms did not improve in the next 48 hours.  Urology was formally consulted. 8/24, patient underwent cystoscopy, ureteroscopy and insertion of left ureteral stone. Symptoms much improved and ready for discharge today 8/28. Urinalysis on admission showed cloudy with small leukocytes.  She was empirically treated with IV Rocephin for 3 days.  No need of antibiotic at discharge. Follow-up with urology as an outpatient for definitive stone management in few weeks. Prescription given for limited supply of Percocet for pain management.  Hypokalemia Potassium level improved with replacement Recent Labs  Lab 06/02/23 1416 06/03/23 0456 06/04/23 0413  K 3.7 3.3* 4.0   Mild hyponatremia Sodium level slightly low at 134 today.  Probably due to vomiting and dehydration.  Expected to improve since her symptoms have not resolved Recent Labs  Lab 06/02/23 1416 06/03/23 0456 06/04/23 0413  NA 137 137 134*    Mild anemia No active blood loss.  Reproductive age group female. Recent Labs    06/02/23 1416 06/03/23 0456 06/04/23 0413  HGB 13.7 11.8* 10.6*  MCV 90.3 93.7 97.0   Goals of care   Code Status: Prior   Wounds:  -    Discharge Exam:   Vitals:   06/04/23 1930 06/04/23 2007 06/05/23 0014 06/05/23 0353  BP: 131/83 122/75 128/85 114/76  Pulse: 79 85 86 88  Resp: 11 18 18 19   Temp:  98.1 F (  36.7 C) 98.2 F (36.8 C) 98.4 F (36.9 C)  TempSrc:  Oral Oral Oral  SpO2: 95% 99% 99% 100%  Weight:      Height:        Body mass index is 29.45 kg/m.   General exam: Pleasant, young Caucasian female. Skin: No rashes, lesions or  ulcers. HEENT: Atraumatic, normocephalic, no obvious bleeding Lungs: Clear to auscultation bilaterally CVS: Regular rate and rhythm, no murmur GI/Abd soft, nontender, bowel sound present.  Left flank tenderness improved. CNS: Alert, awake, oriented x 3 Psychiatry: Mood appropriate Extremities: No pedal edema, no calf tenderness  Follow ups:    Follow-up Information     Manny, Delbert Phenix., MD Follow up.   Specialty: Urology Why: Office will call to schedule outpatient ureteroscopy in about 3 weeks. Contact information: [address] [place] [address] [phone]                 Discharge Instructions:   Discharge Instructions     Call MD for:  difficulty breathing, headache or visual disturbances   Complete by: As directed    Call MD for:  extreme fatigue   Complete by: As directed    Call MD for:  hives   Complete by: As directed    Call MD for:  persistant dizziness or light-headedness   Complete by: As directed    Call MD for:  persistant nausea and vomiting   Complete by: As directed    Call MD for:  severe uncontrolled pain   Complete by: As directed    Call MD for:  temperature >100.4   Complete by: As directed    Diet general   Complete by: As directed    Discharge instructions   Complete by: As directed    Recommendations at discharge:   Judicious use of pain medicines.  Prescription given for limited supply.  Follow-up with urology as an outpatient for definitive stone management in few weeks  General discharge instructions: Follow with Primary MD Corwin Levins, MD in 7 days  Please request your PCP  to go over your hospital tests, procedures, radiology results at the follow up. Please get your medicines reviewed and adjusted.  Your PCP may decide to repeat certain labs or tests as needed. Do not drive, operate heavy machinery, perform activities at heights, swimming or participation in water activities or provide baby sitting services if  your were admitted for syncope or siezures until you have seen by Primary MD or a Neurologist and advised to do so again. [place] Controlled Substance Reporting System database was reviewed. Do not drive, operate heavy machinery, perform activities at heights, swim, participate in water activities or provide baby-sitting services while on medications for pain, sleep and mood until your outpatient physician has reevaluated you and advised to do so again.  You are strongly recommended to comply with the dose, frequency and duration of prescribed medications. Activity: As tolerated with Full fall precautions use walker/cane & assistance as needed Avoid using any recreational substances like cigarette, tobacco, alcohol, or non-prescribed drug. If you experience worsening of your admission symptoms, develop shortness of breath, life threatening emergency, suicidal or homicidal thoughts you must seek medical attention immediately by calling 911 or calling your MD immediately  if symptoms less severe. You must read complete instructions/literature along with all the possible adverse reactions/side effects for all the medicines you take and that have been prescribed to you. Take any new medicine only after you  have completely understood and accepted all the possible adverse reactions/side effects.  Wear Seat belts while driving. You were cared for by a hospitalist during your hospital stay. If you have any questions about your discharge medications or the care you received while you were in the hospital after you are discharged, you can call the unit and ask to speak with the hospitalist or the covering physician. Once you are discharged, your primary care physician will handle any further medical issues. Please note that NO REFILLS for any discharge medications will be authorized once you are discharged, as it is imperative that you return to your primary care physician (or establish a relationship with a  primary care physician if you do not have one).   Increase activity slowly   Complete by: As directed        Discharge Medications:   Allergies as of 06/05/2023       Reactions   Plum Pulp Anaphylaxis, Itching, Swelling, Other (See Comments)   Throat became swollen   Influenza Vaccines Swelling, Rash, Other (See Comments)   Rash and swelling/redness in area of injection.   Levaquin [levofloxacin] Hives, Rash        Medication List     STOP taking these medications    predniSONE 10 MG tablet Commonly known as: DELTASONE       TAKE these medications    albuterol 108 (90 Base) MCG/ACT inhaler Commonly known as: VENTOLIN HFA Inhale 2 puffs into the lungs every 6 (six) hours as needed for wheezing or shortness of breath.   Botox 200 units injection Generic drug: botulinum toxin Type A Inject 155 units IM into multiple site in the face,neck and head once every 90 days What changed:  how much to take how to take this when to take this additional instructions   Collagen 1500/C 500-50-0.8 MG Caps Generic drug: Collagen-Vitamin C-Biotin Take 2 capsules by mouth daily.   ibuprofen 800 MG tablet Commonly known as: ADVIL TAKE 1 TABLET(800 MG) BY MOUTH EVERY 6 HOURS AS NEEDED What changed: See the new instructions.   multivitamin capsule Take 1 capsule by mouth daily.   oxyCODONE 5 MG immediate release tablet Commonly known as: Oxy IR/ROXICODONE Take 1 tablet (5 mg total) by mouth every 8 (eight) hours as needed for up to 5 days for moderate pain.   Ubrelvy 100 MG Tabs Generic drug: Ubrogepant Take 1 tablet by mouth as needed (May repeat 1 tablet after 2 hours if needed.  Maximum 2 tablets in 24 hours). What changed:  how much to take reasons to take this         The results of significant diagnostics from this hospitalization (including imaging, microbiology, ancillary and laboratory) are listed below for reference.    Procedures and Diagnostic Studies:    CT Renal Stone Study  Result Date: 06/02/2023 CLINICAL DATA:  Left flank pain with nausea and blood in urine. EXAM: CT ABDOMEN AND PELVIS WITHOUT CONTRAST TECHNIQUE: Multidetector CT imaging of the abdomen and pelvis was performed following the standard protocol without IV contrast. RADIATION DOSE REDUCTION: This exam was performed according to the departmental dose-optimization program which includes automated exposure control, adjustment of the mA and/or kV according to patient size and/or use of iterative reconstruction technique. COMPARISON:  CT November 2010 FINDINGS: Lower chest: Lung bases are grossly clear.  No pleural effusion. Hepatobiliary: No focal liver abnormality is seen. Status post cholecystectomy. No biliary dilatation. Pancreas: Unremarkable. No pancreatic ductal dilatation or surrounding inflammatory changes.  Spleen: Normal in size without focal abnormality. Adrenals/Urinary Tract: Adrenal glands are preserved. There is a punctate nonobstructing upper pole right-sided renal stone. No right-sided renal collecting system dilatation. Duplicated right renal collecting system. No ureteral stones on the right. Bladder is underdistended. No left-sided intrarenal collecting system stones. Slight ectasia of the left renal collecting system proximally with a stone in the proximal left ureter best seen on coronal series 5, image 47 with a longitudinal length 3-4 mm. No additional left ureteral stones more distal. Stomach/Bowel: Stomach and small bowel are nondilated on this non oral contrast examination. Large bowel also has a normal course and caliber with scattered stool. Normal retrocecal appendix. Vascular/Lymphatic: Normal caliber aorta and IVC. Circumaortic left renal vein. No developing abnormal lymph node enlargement present in the abdomen and pelvis. Reproductive: Uterus is present. There is a area of macroscopic fat associated with the left ovary measuring 15 mm on series 2, image 62.  Possible dermoid. Other: No free intra-abdominal air or free fluid. Musculoskeletal: Mild degenerative changes along the spine. IMPRESSION: 3-4 mm proximal left ureteral stone with mild collecting system dilatation. Punctate nonobstructing right-sided stone. 15 mm area of fat in the left adnexa which may be ovarian and via small ovarian dermoid. Electronically Signed   By: Karen Kays M.D.   On: 06/02/2023 16:40     Labs:   Basic Metabolic Panel: Recent Labs  Lab 06/02/23 1416 06/03/23 0456 06/04/23 0413  NA 137 137 134*  K 3.7 3.3* 4.0  CL 102 109 109  CO2 22 21* 22  GLUCOSE 117* 95 97  BUN 13 10 9   CREATININE 0.96 0.73 0.70  CALCIUM 9.8 8.2* 7.8*   GFR Estimated Creatinine Clearance: 101.8 mL/min (by C-G formula based on SCr of 0.7 mg/dL). Liver Function Tests: Recent Labs  Lab 06/02/23 1416  AST 14*  ALT 11  ALKPHOS 48  BILITOT 1.0  PROT 7.6  ALBUMIN 4.7   Recent Labs  Lab 06/02/23 1416  LIPASE 22   No results for input(s): "AMMONIA" in the last 168 hours. Coagulation profile No results for input(s): "INR", "PROTIME" in the last 168 hours.  CBC: Recent Labs  Lab 06/02/23 1416 06/03/23 0456 06/04/23 0413  WBC 13.2* 9.5 5.8  NEUTROABS 10.2* 4.5 1.7  HGB 13.7 11.8* 10.6*  HCT 39.0 35.7* 32.8*  MCV 90.3 93.7 97.0  PLT 348 268 226   Cardiac Enzymes: No results for input(s): "CKTOTAL", "CKMB", "CKMBINDEX", "TROPONINI" in the last 168 hours. BNP: Invalid input(s): "POCBNP" CBG: No results for input(s): "GLUCAP" in the last 168 hours. D-Dimer No results for input(s): "DDIMER" in the last 72 hours. Hgb A1c No results for input(s): "HGBA1C" in the last 72 hours. Lipid Profile No results for input(s): "CHOL", "HDL", "LDLCALC", "TRIG", "CHOLHDL", "LDLDIRECT" in the last 72 hours. Thyroid function studies No results for input(s): "TSH", "T4TOTAL", "T3FREE", "THYROIDAB" in the last 72 hours.  Invalid input(s): "FREET3" Anemia work up No results for  input(s): "VITAMINB12", "FOLATE", "FERRITIN", "TIBC", "IRON", "RETICCTPCT" in the last 72 hours. Microbiology Recent Results (from the past 240 hour(s))  Surgical pcr screen     Status: None   Collection Time: 06/04/23 12:40 PM   Specimen: Nasal Mucosa; Nasal Swab  Result Value Ref Range Status   MRSA, PCR NEGATIVE NEGATIVE Final   Staphylococcus aureus NEGATIVE NEGATIVE Final    Comment: (NOTE) The Xpert SA Assay (FDA approved for NASAL specimens in patients 86 years of age and older), is one component of a comprehensive  surveillance program. It is not intended to diagnose infection nor to guide or monitor treatment. Performed at [facility], [address], [place], [place] [address]     Time coordinating discharge: 45 minutes  Signed: Melina Schools Dimitria Ketchum  [facility] 06/05/2023, 3:07 PM

## 2023-06-05 NOTE — Anesthesia Postprocedure Evaluation (Signed)
Anesthesia Post Note  Patient: Tharon Aquas  Procedure(s) Performed: CYSTOSCOPY WITH RETROGRADE PYELOGRAM, DIAGNOSTIC URETEROSCOPY AND STENT PLACEMENT (Left)     Patient location during evaluation: PACU Anesthesia Type: General Level of consciousness: awake and alert Pain management: pain level controlled Vital Signs Assessment: post-procedure vital signs reviewed and stable Respiratory status: spontaneous breathing, nonlabored ventilation, respiratory function stable and patient connected to nasal cannula oxygen Cardiovascular status: blood pressure returned to baseline and stable Postop Assessment: no apparent nausea or vomiting Anesthetic complications: no   No notable events documented.  Last Vitals:  Vitals:   06/05/23 0014 06/05/23 0353  BP: 128/85 114/76  Pulse: 86 88  Resp: 18 19  Temp: 36.8 C 36.9 C  SpO2: 99% 100%    Last Pain:  Vitals:   06/05/23 0510  TempSrc:   PainSc: 2                  Mariann Barter

## 2023-06-05 NOTE — Progress Notes (Signed)
   1 Day Post-Op Subjective: NAEON. Some expected postoperative soreness. Lightly pink tinged urine  Objective: Vital signs in last 24 hours: Temp:  [98.1 F (36.7 C)-99.7 F (37.6 C)] 98.4 F (36.9 C) (08/28 0353) Pulse Rate:  [77-108] 88 (08/28 0353) Resp:  [11-19] 19 (08/28 0353) BP: (92-143)/(70-91) 114/76 (08/28 0353) SpO2:  [95 %-100 %] 100 % (08/28 0353) Weight:  [80.3 kg] 80.3 kg (08/27 1543)  Assessment/Plan: #Ureteral stone Left stent with Dr. Berneice Heinrich 8/27 Pt ok to discharge from urologic perspective. No bacteria present on UA, no culture processed, normothermic and hemodynamically stable since admission. Pt should not need additional ABX. Pt to follow up outpatient for definitive stone mgmt in a few weeks.  Weight and work restrictions are limited by tolerance.  Intake/Output from previous day: 08/27 0701 - 08/28 0700 In: 605 [I.V.:605] Out: 1551 [Urine:1550; Emesis/NG output:1]  Intake/Output this shift: No intake/output data recorded.  Physical Exam:  General: Alert and oriented CV: No cyanosis Lungs: equal chest rise   Lab Results: Recent Labs    06/02/23 1416 06/03/23 0456 06/04/23 0413  HGB 13.7 11.8* 10.6*  HCT 39.0 35.7* 32.8*   BMET Recent Labs    06/03/23 0456 06/04/23 0413  NA 137 134*  K 3.3* 4.0  CL 109 109  CO2 21* 22  GLUCOSE 95 97  BUN 10 9  CREATININE 0.73 0.70  CALCIUM 8.2* 7.8*     Studies/Results: DG C-Arm 1-60 Min-No Report  Result Date: 06/04/2023 Fluoroscopy was utilized by the requesting physician.  No radiographic interpretation.      LOS: 2 days   Elmon Kirschner, NP [facility] Pager: [phone]  06/05/2023, 8:16 AM

## 2023-06-06 ENCOUNTER — Telehealth: Payer: Self-pay | Admitting: *Deleted

## 2023-06-06 ENCOUNTER — Encounter: Payer: Self-pay | Admitting: *Deleted

## 2023-06-06 NOTE — Transitions of Care (Post Inpatient/ED Visit) (Signed)
   06/06/2023  Name: Lauren Gonzalez MRN: [id number] DOB: 1986-01-10  Today's TOC FU Call Status: Today's TOC FU Call Status:: Unsuccessful Call (1st Attempt) Unsuccessful Call (1st Attempt) Date: 06/06/23  Attempted to reach the patient regarding the most recent Inpatient visit; left HIPAA compliant voice message requesting call back  Follow Up Plan: Additional outreach attempts will be made to reach the patient to complete the Transitions of Care (Post Inpatient visit) call.   Caryl Pina, RN, BSN, CCRN Alumnus RN CM Care Coordination/ Transition of Care- [facility] Care Management [phone]: direct office

## 2023-06-07 ENCOUNTER — Telehealth: Payer: Self-pay | Admitting: *Deleted

## 2023-06-07 ENCOUNTER — Encounter: Payer: Self-pay | Admitting: *Deleted

## 2023-06-07 ENCOUNTER — Ambulatory Visit: Payer: BC Managed Care – PPO | Admitting: Neurology

## 2023-06-07 ENCOUNTER — Encounter: Payer: Self-pay | Admitting: Neurology

## 2023-06-07 DIAGNOSIS — G43709 Chronic migraine without aura, not intractable, without status migrainosus: Secondary | ICD-10-CM

## 2023-06-07 MED ORDER — ONABOTULINUMTOXINA 100 UNITS IJ SOLR
200.0000 [IU] | Freq: Once | INTRAMUSCULAR | Status: AC
Start: 2023-06-07 — End: 2023-06-07
  Administered 2023-06-07: 155 [IU] via INTRAMUSCULAR

## 2023-06-07 NOTE — Progress Notes (Signed)

## 2023-06-07 NOTE — Transitions of Care (Post Inpatient/ED Visit) (Signed)
   06/07/2023  Name: Lauren Gonzalez MRN: [id number] DOB: 23-Nov-1985  Today's TOC FU Call Status: Today's TOC FU Call Status:: Unsuccessful Call (2nd Attempt) Unsuccessful Call (2nd Attempt) Date: 06/07/23  Attempted to reach the patient regarding the most recent Inpatient visit; left HIPAA compliant voice message requesting call back  Follow Up Plan: Additional outreach attempts will be made to reach the patient to complete the Transitions of Care (Post Inpatient visit) call.   Caryl Pina, RN, BSN, CCRN Alumnus RN CM Care Coordination/ Transition of Care- [facility] Care Management [phone]: direct office

## 2023-06-11 ENCOUNTER — Telehealth: Payer: Self-pay | Admitting: *Deleted

## 2023-06-11 ENCOUNTER — Encounter: Payer: Self-pay | Admitting: *Deleted

## 2023-06-11 NOTE — Transitions of Care (Post Inpatient/ED Visit) (Signed)
06/11/2023  Name: Lauren Gonzalez MRN: [id number] DOB: 04/23/1986  Today's TOC FU Call Status: Today's TOC FU Call Status:: Successful TOC FU Call Completed TOC FU Call Complete Date: 06/11/23 Patient's Name and Date of Birth confirmed.  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  Thanks forgetting this appointment scheduled for me with Dr. Jonny Ruiz.  I will call the urology doctor now to get my appointment scheduled and questions answered") Any questions or concerns?: Yes Patient Questions/Concerns:: ongoing minimal bleeding noted in urine post-stent placement Patient Questions/Concerns Addressed: Other: (Facilitated scheduling of HFU appointment with PCP; encouraged patient to call and schedule urology appointment today- she is agreeable)  Items Reviewed: Did you receive and understand the discharge instructions provided?: Yes (thoroughly reviewed with patient who verbalizes good understanding of same) Medications obtained,verified, and reconciled?: Yes (Medications Reviewed) (Full medication reconciliation/ review completed; no concerns or discrepancies identified; confirmed patient obtained/ is taking all newly Rx'd medications as instructed; self-manages medications and denies questions/ concerns around medications today) Any new allergies since your discharge?: No Dietary orders reviewed?: Yes Type of Diet Ordered:: Regular Do you have support at home?: Yes People in Home: significant other Name of Support/Comfort Primary Source: Reports independent in self-care activities; supportive fiance assists as/ if needed/  indicated  Medications Reviewed Today: Medications Reviewed Today     Reviewed by Michaela Corner, RN (Registered Nurse) on 06/11/23 at 1014  Med List Status: <None>   Medication Order Taking? Sig Documenting Provider Last Dose Status Informant  albuterol (PROVENTIL HFA;VENTOLIN HFA) 108 (90 Base) MCG/ACT inhaler [id number] Yes Inhale 2 puffs into the lungs every 6 (six) hours as needed for wheezing or shortness of breath. Nche, Bonna Gains, NP Taking Active Self           Med Note Antony Madura, Dyane Dustman Jun 02, 2023  8:10 PM) PROVIDER: The patient's medication had expired. Can a new order be issued?  botulinum toxin Type A (BOTOX) 200 units injection [id number] Yes Inject 155 units IM into multiple site in the face,neck and head once every 90 days  Patient taking differently: Inject 155 Units into the muscle See admin instructions. Inject 155 units IM into multiple sites in the face,neck and head once every 90 days   Drema Dallas, DO Taking Active Self  Collagen-Vitamin C-Biotin (COLLAGEN 1500/C) 500-50-0.8 MG CAPS [id number] Yes Take 2 capsules by mouth daily. [provider] Taking Active Self  ibuprofen (ADVIL) 800 MG tablet [id number] Yes TAKE 1 TABLET(800 MG) BY MOUTH EVERY 6 HOURS AS NEEDED  Patient taking differently: Take 800 mg by mouth every 6 (six) hours as needed (for headaches).   Corwin Levins, MD Taking Active Self  Multiple Vitamin (MULTIVITAMIN) capsule [id number] Yes Take 1 capsule by mouth daily. [provider] Taking Active Self  oxyCODONE (OXY IR/ROXICODONE) 5 MG immediate release tablet [id number] Yes Take 5 mg by mouth every 6 (six) hours as needed for severe pain. 06/11/23: reports during [facility] call was prescribed at time of hospital discharge on 06/05/23 Corwin Levins, MD Taking Active Self           Med Note Michaela Corner   Tue Jun 11, 2023 10:14 AM) 06/11/23: reports during TOC call was prescribed at time of hospital discharge on 06/05/23   Ubrogepant  (UBRELVY) 100 MG TABS [id number] Yes Take 1 tablet by mouth as needed (May repeat 1 tablet after 2 hours if needed.  Maximum 2 tablets in 24 hours).  Patient taking differently: Take 100 mg by mouth as needed (for migraines and may repeat once in 2 hours, if no relief. Maximum 200 mg (2 tablets) in 24 hours.).   Drema Dallas, DO Taking Active Self           Home Care and Equipment/Supplies: Were Home Health Services Ordered?: No Any new equipment or medical supplies ordered?: No  Functional Questionnaire: Do you need assistance with bathing/showering or dressing?: No Do you need assistance with meal preparation?: No Do you need assistance with eating?: No Do you have difficulty maintaining continence: No Do you need assistance with getting out of bed/getting out of a chair/moving?: No Do you have difficulty managing or taking your medications?: No  Follow up appointments reviewed: PCP Follow-up appointment confirmed?: Yes Date of PCP follow-up appointment?: 06/12/23 Follow-up Provider: PCP Specialist Hospital Follow-up appointment confirmed?: No Reason Specialist Follow-Up Not Confirmed: Patient has Specialist Provider Number and will Call for Appointment Do you need transportation to your follow-up appointment?: No Do you understand care options if your condition(s) worsen?: Yes-patient verbalized understanding  SDOH Interventions Today    Flowsheet Row Most Recent Value  SDOH Interventions   Food Insecurity Interventions Intervention Not Indicated  Transportation Interventions Intervention Not Indicated  [reports drives self]      TOC Interventions Today    Flowsheet Row Most Recent Value  TOC Interventions   TOC Interventions Discussed/Reviewed TOC Interventions Discussed, Arranged PCP follow up within 7 days/Care Guide scheduled  [Patient declines need for ongoing/ further care coordination outreach,  no care coordination needs identified at time of TOC call today]       Interventions Today    Flowsheet Row Most Recent Value  Chronic Disease   Chronic disease during today's visit Other  [obstructive uropathy with stent placement]  General Interventions   General Interventions Discussed/Reviewed General Interventions Discussed, Doctor Visits  Doctor Visits Discussed/Reviewed Doctor Visits Discussed, PCP, Specialist  PCP/Specialist Visits Compliance with follow-up visit  Education Interventions   Education Provided Provided Education  Provided Verbal Education On Medication, When to see the doctor  [process to take to have narcotic pain medicine refilled]  Nutrition Interventions   Nutrition Discussed/Reviewed Nutrition Discussed  Pharmacy Interventions   Pharmacy Dicussed/Reviewed Pharmacy Topics Discussed  [Full medication review with updating medication list in EHR per patient report]      Caryl Pina, RN, BSN, CCRN Alumnus RN CM Care Coordination/ Transition of Care- [facility] Care Management [phone]: direct office

## 2023-06-12 ENCOUNTER — Encounter: Payer: Self-pay | Admitting: Internal Medicine

## 2023-06-12 ENCOUNTER — Other Ambulatory Visit: Payer: Self-pay | Admitting: Urology

## 2023-06-12 ENCOUNTER — Ambulatory Visit (INDEPENDENT_AMBULATORY_CARE_PROVIDER_SITE_OTHER): Payer: BC Managed Care – PPO | Admitting: Internal Medicine

## 2023-06-12 VITALS — BP 120/76 | HR 82 | Temp 98.4°F | Ht 65.0 in | Wt 174.0 lb

## 2023-06-12 DIAGNOSIS — D649 Anemia, unspecified: Secondary | ICD-10-CM

## 2023-06-12 DIAGNOSIS — E538 Deficiency of other specified B group vitamins: Secondary | ICD-10-CM

## 2023-06-12 DIAGNOSIS — N200 Calculus of kidney: Secondary | ICD-10-CM

## 2023-06-12 DIAGNOSIS — Z0000 Encounter for general adult medical examination without abnormal findings: Secondary | ICD-10-CM

## 2023-06-12 DIAGNOSIS — E782 Mixed hyperlipidemia: Secondary | ICD-10-CM

## 2023-06-12 DIAGNOSIS — R739 Hyperglycemia, unspecified: Secondary | ICD-10-CM

## 2023-06-12 DIAGNOSIS — E611 Iron deficiency: Secondary | ICD-10-CM | POA: Diagnosis not present

## 2023-06-12 DIAGNOSIS — E559 Vitamin D deficiency, unspecified: Secondary | ICD-10-CM | POA: Diagnosis not present

## 2023-06-12 DIAGNOSIS — Z0001 Encounter for general adult medical examination with abnormal findings: Secondary | ICD-10-CM

## 2023-06-12 LAB — CBC WITH DIFFERENTIAL/PLATELET
Basophils Absolute: 0.1 10*3/uL (ref 0.0–0.1)
Basophils Relative: 0.9 % (ref 0.0–3.0)
Eosinophils Absolute: 0.3 10*3/uL (ref 0.0–0.7)
Eosinophils Relative: 5.3 % — ABNORMAL HIGH (ref 0.0–5.0)
HCT: 39.4 % (ref 36.0–46.0)
Hemoglobin: 13.1 g/dL (ref 12.0–15.0)
Lymphocytes Relative: 35.9 % (ref 12.0–46.0)
Lymphs Abs: 2.3 10*3/uL (ref 0.7–4.0)
MCHC: 33.3 g/dL (ref 30.0–36.0)
MCV: 93.4 fl (ref 78.0–100.0)
Monocytes Absolute: 0.5 10*3/uL (ref 0.1–1.0)
Monocytes Relative: 8.6 % (ref 3.0–12.0)
Neutro Abs: 3.1 10*3/uL (ref 1.4–7.7)
Neutrophils Relative %: 49.3 % (ref 43.0–77.0)
Platelets: 312 10*3/uL (ref 150.0–400.0)
RBC: 4.22 Mil/uL (ref 3.87–5.11)
RDW: 12.6 % (ref 11.5–15.5)
WBC: 6.3 10*3/uL (ref 4.0–10.5)

## 2023-06-12 LAB — BASIC METABOLIC PANEL
BUN: 11 mg/dL (ref 6–23)
CO2: 24 meq/L (ref 19–32)
Calcium: 8.8 mg/dL (ref 8.4–10.5)
Chloride: 107 meq/L (ref 96–112)
Creatinine, Ser: 0.68 mg/dL (ref 0.40–1.20)
GFR: 111.93 mL/min (ref >60.00)
Glucose, Bld: 89 mg/dL (ref 70–99)
Potassium: 4.4 meq/L (ref 3.5–5.1)
Sodium: 138 meq/L (ref 135–145)

## 2023-06-12 LAB — LIPID PANEL
Cholesterol: 160 mg/dL (ref 0–200)
HDL: 48.8 mg/dL (ref >39.00)
LDL Cholesterol: 94 mg/dL (ref 0–99)
NonHDL: 111.28
Total CHOL/HDL Ratio: 3
Triglycerides: 87 mg/dL (ref 0.0–149.0)
VLDL: 17.4 mg/dL (ref 0.0–40.0)

## 2023-06-12 LAB — HEPATIC FUNCTION PANEL
ALT: 12 U/L (ref 0–35)
AST: 14 U/L (ref 0–37)
Albumin: 4.1 g/dL (ref 3.5–5.2)
Alkaline Phosphatase: 50 U/L (ref 39–117)
Bilirubin, Direct: 0.1 mg/dL (ref 0.0–0.3)
Total Bilirubin: 0.8 mg/dL (ref 0.2–1.2)
Total Protein: 7.4 g/dL (ref 6.0–8.3)

## 2023-06-12 LAB — URINALYSIS, ROUTINE W REFLEX MICROSCOPIC
Bilirubin Urine: NEGATIVE
Ketones, ur: NEGATIVE
Nitrite: NEGATIVE
Specific Gravity, Urine: >=1.03 — AB (ref 1.000–1.030)
Total Protein, Urine: 100 — AB
Urine Glucose: NEGATIVE
Urobilinogen, UA: 0.2 (ref 0.0–1.0)
pH: 6 (ref 5.0–8.0)

## 2023-06-12 LAB — IBC PANEL
Iron: 61 ug/dL (ref 42–145)
Saturation Ratios: 16.1 % — ABNORMAL LOW (ref 20.0–50.0)
TIBC: 378 ug/dL (ref 250.0–450.0)
Transferrin: 270 mg/dL (ref 212.0–360.0)

## 2023-06-12 LAB — VITAMIN B12: Vitamin B-12: 233 pg/mL (ref 211–911)

## 2023-06-12 LAB — VITAMIN D 25 HYDROXY (VIT D DEFICIENCY, FRACTURES): VITD: 40.28 ng/mL (ref 30.00–100.00)

## 2023-06-12 LAB — FERRITIN: Ferritin: 49.2 ng/mL (ref 10.0–291.0)

## 2023-06-12 LAB — HEMOGLOBIN A1C: Hgb A1c MFr Bld: 5.4 % (ref 4.6–6.5)

## 2023-06-12 LAB — TSH: TSH: 1.13 u[IU]/mL (ref 0.35–5.50)

## 2023-06-12 MED ORDER — OXYCODONE HCL 5 MG PO TABS: 5.0000 mg | ORAL_TABLET | Freq: Four times a day (QID) | ORAL | 0 refills | Status: DC | PRN

## 2023-06-12 NOTE — Progress Notes (Addendum)
Patient ID: Lauren Gonzalez, female   DOB: [birth year], 37 y.o.   MRN: [id number]         Chief Complaint:: wellness exam and left renal stone, anemia, hld, hyperglycemia       HPI:  Lauren Gonzalez is a 37 y.o. female here for wellness exam; declines covid booster, tdap, pt to call for pap with GYN, o/w up to date                Also s/p recent active left renal stone now s/p stent with plan to f/u at 2 wks for stent change or removal, and stone treatment.  Has persistent pain despite stent placement, Had significant anemia more than expected with the episode to hgb in 10s.  Pt denies chest pain, increased sob or doe, wheezing, orthopnea, PND, increased LE swelling, palpitations, dizziness or syncope.   Pt denies polydipsia, polyuria, or new focal neuro s/s.    Pt denies fever, wt loss, night sweats, loss of appetite, or other constitutional symptoms     Wt Readings from Last 3 Encounters:  06/12/23 174 lb (78.9 kg)  06/04/23 177 lb (80.3 kg)  02/14/23 188 lb (85.3 kg)   BP Readings from Last 3 Encounters:  06/12/23 120/76  06/05/23 114/76  02/14/23 126/78   Immunization History  Administered Date(s) Administered   Hepatitis A 08/26/2012   Influenza Split 07/28/2012   Influenza,inj,Quad PF,6+ Mos 07/22/2014, 11/30/2015   PPD Test 07/26/2014, 08/04/2014   Pneumococcal Polysaccharide-23 10/09/2003   Tdap 08/26/2012   Health Maintenance Due  Topic Date Due   PAP SMEAR-Modifier  09/21/2016   DTaP/Tdap/Td (2 - Td or Tdap) 08/26/2022   COVID-19 Vaccine (1 - 2023-24 season) Never done      Past Medical History:  Diagnosis Date   ADD (attention deficit disorder) 12/29/2013   Allergic rhinitis, cause unspecified    DISORDER, BIPOLAR NEC    DVT (deep venous thrombosis) (HCC)    in leg July 2020 (vaginal ring discontinued after blood clot/started on Xarelto - off now)   Insomnia, unspecified    MIGRAINE HEADACHE    Past Surgical History:  Procedure Laterality Date   CHOLECYSTECTOMY      CYSTOSCOPY WITH RETROGRADE PYELOGRAM, URETEROSCOPY AND STENT PLACEMENT Left 06/04/2023   Procedure: CYSTOSCOPY WITH RETROGRADE PYELOGRAM, DIAGNOSTIC URETEROSCOPY AND STENT PLACEMENT;  Surgeon: Loletta Parish., MD;  Location: [facility];  Service: Urology;  Laterality: Left;   FRENULECTOMY, LINGUAL     of the base of the tongue   s/p left hip labral tear surgury  dec. 2010   TONSILLECTOMY     TONSILLECTOMY N/A 03/22/2018   Procedure: CAUTERY OF POST TONSILLAR BLEEDING;  Surgeon: Christia Reading, MD;  Location: [facility] OR;  Service: ENT;  Laterality: N/A;   TYMPANOSTOMY TUBE PLACEMENT     as a child   WISDOM TOOTH EXTRACTION      reports that she has quit smoking. She has never used smokeless tobacco. She reports that she does not currently use alcohol. She reports current drug use. Drug: Marijuana. family history includes Arthritis in an other family member; Cancer in an other family member; Colon polyps in her father; Depression in an other family member; Diabetes in an other family member; Heart disease in an other family member; Stroke in an other family member. Allergies  Allergen Reactions   Plum Pulp Anaphylaxis, Itching, Swelling and Other (See Comments)    Throat became swollen   Influenza Vaccines Swelling, Rash and Other (  See Comments)    Rash and swelling/redness in area of injection.   Levaquin [Levofloxacin] Hives and Rash   Current Outpatient Medications on File Prior to Visit  Medication Sig Dispense Refill   albuterol (PROVENTIL HFA;VENTOLIN HFA) 108 (90 Base) MCG/ACT inhaler Inhale 2 puffs into the lungs every 6 (six) hours as needed for wheezing or shortness of breath. 1 Inhaler 0   botulinum toxin Type A (BOTOX) 200 units injection Inject 155 units IM into multiple site in the face,neck and head once every 90 days (Patient taking differently: Inject 155 Units into the muscle See admin instructions. Inject 155 units IM into multiple sites in the face,neck and head once every 90  days) 1 each 4   ibuprofen (ADVIL) 800 MG tablet TAKE 1 TABLET(800 MG) BY MOUTH EVERY 6 HOURS AS NEEDED 60 tablet 2   Ubrogepant (UBRELVY) 100 MG TABS Take 1 tablet by mouth as needed (May repeat 1 tablet after 2 hours if needed.  Maximum 2 tablets in 24 hours). 16 tablet 3   No current facility-administered medications on file prior to visit.        ROS:  All others reviewed and negative.  Objective        PE:  BP 120/76 (BP Location: Right Arm, Patient Position: Sitting, Cuff Size: Normal)   Pulse 82   Temp 98.4 F (36.9 C) (Oral)   Ht 5\' 5"  (1.651 m)   Wt 174 lb (78.9 kg)   LMP 05/19/2023 (Approximate)   SpO2 98%   BMI 28.96 kg/m                 Constitutional: Pt appears in NAD               HENT: Head: NCAT.                Right Ear: External ear normal.                 Left Ear: External ear normal.                Eyes: . Pupils are equal, round, and reactive to light. Conjunctivae and EOM are normal               Nose: without d/c or deformity               Neck: Neck supple. Gross normal ROM               Cardiovascular: Normal rate and regular rhythm.                 Pulmonary/Chest: Effort normal and breath sounds without rales or wheezing.                Abd:  Soft, NT, ND, + BS, no organomegaly               Neurological: Pt is alert. At baseline orientation, motor grossly intact               Skin: Skin is warm. No rashes, no other new lesions, LE edema - none               Psychiatric: Pt behavior is normal without agitation   Micro: none  Cardiac tracings I have personally interpreted today:  none  Pertinent Radiological findings (summarize): none   Lab Results  Component Value Date   WBC 6.3 06/12/2023   HGB 13.1 06/12/2023   HCT 39.4 06/12/2023  PLT 312.0 06/12/2023   GLUCOSE 89 06/12/2023   CHOL 160 06/12/2023   TRIG 87.0 06/12/2023   HDL 48.80 06/12/2023   LDLDIRECT 143.0 11/13/2018   LDLCALC 94 06/12/2023   ALT 12 06/12/2023   AST 14  06/12/2023   NA 138 06/12/2023   K 4.4 06/12/2023   CL 107 06/12/2023   CREATININE 0.68 06/12/2023   BUN 11 06/12/2023   CO2 24 06/12/2023   TSH 1.13 06/12/2023   HGBA1C 5.4 06/12/2023   Assessment/Plan:  Lauren Gonzalez is a 37 y.o. White or Caucasian [1] female with  has a past medical history of ADD (attention deficit disorder) (12/29/2013), Allergic rhinitis, cause unspecified, DISORDER, BIPOLAR NEC, DVT (deep venous thrombosis) (HCC), Insomnia, unspecified, and MIGRAINE HEADACHE.  Encounter for well adult exam with abnormal findings Age and sex appropriate education and counseling updated with regular exercise and diet Referrals for preventative services - none needed Immunizations addressed - declines covid booster, tdap Smoking counseling  - none needed Evidence for depression or other mood disorder - none significant Most recent labs reviewed. I have personally reviewed and have noted: 1) the patient's medical and social history 2) The patient's current medications and supplements 3) The patient's height, weight, and BMI have been recorded in the chart   Mixed hyperlipidemia Lab Results  Component Value Date   LDLCALC 94 06/12/2023   Stable, pt to continue lower chol diet   Hyperglycemia Lab Results  Component Value Date   HGBA1C 5.4 06/12/2023   Stable, pt to continue current medical treatment  - diet, wt control  Anemia Lab Results  Component Value Date   WBC 6.3 06/12/2023   HGB 13.1 06/12/2023   HCT 39.4 06/12/2023   MCV 93.4 06/12/2023   PLT 312.0 06/12/2023  Improved,  to f/u any worsening symptoms or concerns  Left renal stone With stent currently in place, for pain med refill, f/u urology as planned  B12 deficiency Lab Results  Component Value Date   VITAMINB12 233 06/12/2023   Low, to start oral replacement - b12 1000 mcg qd   Followup: Return in about 6 months (around 12/10/2023).  Oliver Barre, MD 06/15/2023 6:09 PM  Medical  Group [facility] - [facility] Internal Medicine

## 2023-06-12 NOTE — Patient Instructions (Signed)
Please continue all other medications as before, including the oxycodone as needed  Please have the pharmacy call with any other refills you may need.  Please continue your efforts at being more active, low cholesterol diet, and weight control.  You are otherwise up to date with prevention measures today.  Please keep your appointments with your specialists as you may have planned - urology  Please go to the LAB at the blood drawing area for the tests to be done  You will be contacted by phone if any changes need to be made immediately.  Otherwise, you will receive a letter about your results with an explanation, but please check with MyChart first.  Please make an Appointment to return in 6 months, or sooner if needed

## 2023-06-12 NOTE — Progress Notes (Signed)
The test results show that your current treatment is OK, as the tests are stable.  Please continue the same plan.  There is no other need for change of treatment or further evaluation based on these results, at this time.  thanks 

## 2023-06-15 ENCOUNTER — Encounter: Payer: Self-pay | Admitting: Internal Medicine

## 2023-06-15 DIAGNOSIS — E538 Deficiency of other specified B group vitamins: Secondary | ICD-10-CM | POA: Insufficient documentation

## 2023-06-15 DIAGNOSIS — N200 Calculus of kidney: Secondary | ICD-10-CM | POA: Insufficient documentation

## 2023-06-15 NOTE — Assessment & Plan Note (Signed)
Lab Results  Component Value Date   LDLCALC 94 06/12/2023   Stable, pt to continue lower chol diet

## 2023-06-15 NOTE — Assessment & Plan Note (Signed)
Lab Results  Component Value Date   WBC 6.3 06/12/2023   HGB 13.1 06/12/2023   HCT 39.4 06/12/2023   MCV 93.4 06/12/2023   PLT 312.0 06/12/2023  Improved,  to f/u any worsening symptoms or concerns

## 2023-06-15 NOTE — Assessment & Plan Note (Signed)
With stent currently in place, for pain med refill, f/u urology as planned

## 2023-06-15 NOTE — Assessment & Plan Note (Signed)
Age and sex appropriate education and counseling updated with regular exercise and diet Referrals for preventative services - none needed Immunizations addressed - declines covid booster, tdap Smoking counseling  - none needed Evidence for depression or other mood disorder - none significant Most recent labs reviewed. I have personally reviewed and have noted: 1) the patient's medical and social history 2) The patient's current medications and supplements 3) The patient's height, weight, and BMI have been recorded in the chart  

## 2023-06-15 NOTE — Assessment & Plan Note (Signed)
Lab Results  Component Value Date   HGBA1C 5.4 06/12/2023   Stable, pt to continue current medical treatment  - diet, wt control

## 2023-06-15 NOTE — Addendum Note (Signed)
Addended by: Corwin Levins on: 06/15/2023 06:09 PM   Modules accepted: Level of Service

## 2023-06-15 NOTE — Assessment & Plan Note (Signed)
Lab Results  Component Value Date   VITAMINB12 233 06/12/2023   Low, to start oral replacement - b12 1000 mcg qd

## 2023-06-17 NOTE — Progress Notes (Signed)
COVID Vaccine received:  [x]  No []  Yes Date of any COVID positive Test in last 90 days: No PCP - Oliver Barre MD Cardiologist -   Chest x-ray - 12/02/17 epic EKG -  06/02/23 Epic Stress Test -  ECHO - 04/14/10 Epic Cardiac Cath -   Bowel Prep - [x]  No  []   Yes ______  Pacemaker / ICD device [x]  No []  Yes   Spinal Cord Stimulator:[x]  No []  Yes       History of Sleep Apnea? [x]  No []  Yes   CPAP used?- [x]  No []  Yes    Does the patient monitor blood sugar?          [x]  No []  Yes  []  N/A  Patient has: [x]  NO Hx DM   []  Pre-DM                 []  DM1  []   DM2 Does patient have a [employer] or Dexacom? []  No []  Yes   Fasting Blood Sugar Ranges-  Checks Blood Sugar _____ times a day  GLP1 agonist / usual dose - no GLP1 instructions:  SGLT-2 inhibitors / usual dose - no SGLT-2 instructions:   Blood Thinner / Instructions:no Aspirin Instructions:no  Comments:   Activity level: Patient is able to climb a flight of stairs without difficulty; [x]  No CP  [x]  No SOB,    Patient can perform ADLs without assistance.   Anesthesia review:   Patient denies shortness of breath, fever, cough and chest pain at PAT appointment.  Patient verbalized understanding and agreement to the Pre-Surgical Instructions that were given to them at this PAT appointment. Patient was also educated of the need to review these PAT instructions again prior to his/her surgery.I reviewed the appropriate phone numbers to call if they have any and questions or concerns.

## 2023-06-17 NOTE — Patient Instructions (Signed)
SURGICAL WAITING ROOM VISITATION  Patients having surgery or a procedure may have no more than 2 support people in the waiting area - these visitors may rotate.    Children under the age of 45 must have an adult with them who is not the patient.  Due to an increase in RSV and influenza rates and associated hospitalizations, children ages 47 and under may not visit patients in [facility] hospitals.  If the patient needs to stay at the hospital during part of their recovery, the visitor guidelines for inpatient rooms apply. Pre-op nurse will coordinate an appropriate time for 1 support person to accompany patient in pre-op.  This support person may not rotate.    Please refer to the [facility] website for the visitor guidelines for Inpatients (after your surgery is over and you are in a regular room).       Your procedure is scheduled on: 06/26/23   Report to [facility] Main Entrance    Report to admitting at 1:15 PM   Call this number if you have problems the morning of surgery [phone]   Do not eat food  or drink liquids:After Midnight.        Oral Hygiene is also important to reduce your risk of infection.                                    Remember - BRUSH YOUR TEETH THE MORNING OF SURGERY WITH YOUR REGULAR TOOTHPASTE   Stop all vitamins and herbal supplements 7 days before surgery.   Take these medicines the morning of surgery with A SIP OF WATER:  Oxycodon if needed, Ubrelvy if needed             You may not have any metal on your body including hair pins, jewelry, and body piercing             Do not wear make-up, lotions, powders, perfumes or deodorant  Do not wear nail polish including gel and S&S, artificial/acrylic nails, or any other type of covering on natural nails including finger and toenails. If you have artificial nails, gel coating, etc. that needs to be removed by a nail salon please have this removed prior to surgery or surgery may need to be  canceled/ delayed if the surgeon/ anesthesia feels like they are unable to be safely monitored.   Do not shave  48 hours prior to surgery.    Do not bring valuables to the hospital. [facility] IS NOT             RESPONSIBLE   FOR VALUABLES.   Contacts, glasses, dentures or bridgework may not be worn into surgery.  DO NOT BRING YOUR HOME MEDICATIONS TO THE HOSPITAL. PHARMACY WILL DISPENSE MEDICATIONS LISTED ON YOUR MEDICATION LIST TO YOU DURING YOUR ADMISSION IN THE HOSPITAL!    Patients discharged on the day of surgery will not be allowed to drive home.  Someone NEEDS to stay with you for the first 24 hours after anesthesia.   Special Instructions: Bring a copy of your healthcare power of attorney and living will documents the day of surgery if you haven't scanned them before.              Please read over the following fact sheets you were given: IF YOU HAVE QUESTIONS ABOUT YOUR PRE-OP INSTRUCTIONS PLEASE CALL [phone] [name]   If you received  a COVID test during your pre-op visit  it is requested that you wear a mask when out in public, stay away from anyone that may not be feeling well and notify your surgeon if you develop symptoms. If you test positive for Covid or have been in contact with anyone that has tested positive in the last 10 days please notify you surgeon.    [facility] - Preparing for Surgery Before surgery, you can play an important role.  Because skin is not sterile, your skin needs to be as free of germs as possible.  You can reduce the number of germs on your skin by washing with CHG (chlorahexidine gluconate) soap before surgery.  CHG is an antiseptic cleaner which kills germs and bonds with the skin to continue killing germs even after washing. Please DO NOT use if you have an allergy to CHG or antibacterial soaps.  If your skin becomes reddened/irritated stop using the CHG and inform your nurse when you arrive at Short Stay. Do not shave (including legs and  underarms) for at least 48 hours prior to the first CHG shower.  You may shave your face/neck.  Please follow these instructions carefully:  1.  Shower with CHG Soap the night before surgery and the  morning of surgery.  2.  If you choose to wash your hair, wash your hair first as usual with your normal  shampoo.  3.  After you shampoo, rinse your hair and body thoroughly to remove the shampoo.                             4.  Use CHG as you would any other liquid soap.  You can apply chg directly to the skin and wash.  Gently with a scrungie or clean washcloth.  5.  Apply the CHG Soap to your body ONLY FROM THE NECK DOWN.   Do   not use on face/ open                           Wound or open sores. Avoid contact with eyes, ears mouth and   genitals (private parts).                       Wash face,  Genitals (private parts) with your normal soap.             6.  Wash thoroughly, paying special attention to the area where your    surgery  will be performed.  7.  Thoroughly rinse your body with warm water from the neck down.  8.  DO NOT shower/wash with your normal soap after using and rinsing off the CHG Soap.                9.  Pat yourself dry with a clean towel.            10.  Wear clean pajamas.            11.  Place clean sheets on your bed the night of your first shower and do not  sleep with pets. Day of Surgery : Do not apply any lotions/deodorants the morning of surgery.  Please wear clean clothes to the hospital/surgery center.  FAILURE TO FOLLOW THESE INSTRUCTIONS MAY RESULT IN THE CANCELLATION OF YOUR SURGERY  PATIENT SIGNATURE_________________________________  NURSE SIGNATURE__________________________________  ________________________________________________________________________

## 2023-06-18 ENCOUNTER — Encounter (HOSPITAL_COMMUNITY)
Admission: RE | Admit: 2023-06-18 | Discharge: 2023-06-18 | Disposition: A | Discharge status: Home / Self Care | Payer: BC Managed Care – PPO | Source: Ambulatory Visit | Attending: Urology | Admitting: Urology

## 2023-06-18 ENCOUNTER — Other Ambulatory Visit: Payer: Self-pay

## 2023-06-18 ENCOUNTER — Encounter (HOSPITAL_COMMUNITY): Payer: Self-pay

## 2023-06-18 DIAGNOSIS — Z01818 Encounter for other preprocedural examination: Secondary | ICD-10-CM | POA: Insufficient documentation

## 2023-06-18 HISTORY — DX: Personal history of urinary calculi: Z87.442

## 2023-06-26 ENCOUNTER — Ambulatory Visit (HOSPITAL_COMMUNITY): Payer: BC Managed Care – PPO | Admitting: Anesthesiology

## 2023-06-26 ENCOUNTER — Encounter (HOSPITAL_COMMUNITY): Payer: Self-pay | Admitting: Urology

## 2023-06-26 ENCOUNTER — Ambulatory Visit (HOSPITAL_COMMUNITY): Payer: Self-pay | Admitting: Anesthesiology

## 2023-06-26 ENCOUNTER — Other Ambulatory Visit: Payer: Self-pay

## 2023-06-26 ENCOUNTER — Ambulatory Visit (HOSPITAL_COMMUNITY): Payer: BC Managed Care – PPO

## 2023-06-26 ENCOUNTER — Encounter (HOSPITAL_COMMUNITY)
Admission: RE | Disposition: A | Discharge status: Home / Self Care | Payer: Self-pay | Source: Ambulatory Visit | Attending: Urology

## 2023-06-26 ENCOUNTER — Ambulatory Visit (HOSPITAL_COMMUNITY)
Admission: RE | Admit: 2023-06-26 | Discharge: 2023-06-26 | Disposition: A | Discharge status: Home / Self Care | Payer: BC Managed Care – PPO | Source: Ambulatory Visit | Attending: Urology | Admitting: Urology

## 2023-06-26 DIAGNOSIS — Z87891 Personal history of nicotine dependence: Secondary | ICD-10-CM | POA: Insufficient documentation

## 2023-06-26 DIAGNOSIS — N201 Calculus of ureter: Secondary | ICD-10-CM | POA: Insufficient documentation

## 2023-06-26 DIAGNOSIS — F1290 Cannabis use, unspecified, uncomplicated: Secondary | ICD-10-CM | POA: Insufficient documentation

## 2023-06-26 DIAGNOSIS — Z87442 Personal history of urinary calculi: Secondary | ICD-10-CM | POA: Diagnosis not present

## 2023-06-26 DIAGNOSIS — Z86718 Personal history of other venous thrombosis and embolism: Secondary | ICD-10-CM | POA: Diagnosis not present

## 2023-06-26 DIAGNOSIS — Z960 Presence of urogenital implants: Secondary | ICD-10-CM | POA: Diagnosis not present

## 2023-06-26 HISTORY — PX: CYSTOSCOPY WITH RETROGRADE PYELOGRAM, URETEROSCOPY AND STENT PLACEMENT: SHX5789

## 2023-06-26 LAB — POCT PREGNANCY, URINE: Preg Test, Ur: NEGATIVE

## 2023-06-26 SURGERY — CYSTOURETEROSCOPY, WITH RETROGRADE PYELOGRAM AND STENT INSERTION
Anesthesia: General | Site: Ureter | Laterality: Left

## 2023-06-26 MED ORDER — OXYCODONE HCL 5 MG PO TABS: 5.0000 mg | ORAL_TABLET | Freq: Once | ORAL | Status: AC | PRN

## 2023-06-26 MED ORDER — DEXAMETHASONE SODIUM PHOSPHATE 4 MG/ML IJ SOLN
INTRAMUSCULAR | Status: DC | PRN
Start: 2023-06-26 — End: 2023-06-26
  Administered 2023-06-26: 4 mg via INTRAVENOUS

## 2023-06-26 MED ORDER — 0.9 % SODIUM CHLORIDE (POUR BTL) OPTIME
TOPICAL | Status: DC | PRN
Start: 2023-06-26 — End: 2023-06-26
  Administered 2023-06-26: 1000 mL

## 2023-06-26 MED ORDER — PROPOFOL 10 MG/ML IV BOLUS
INTRAVENOUS | Status: AC
  Filled 2023-06-26: qty 20

## 2023-06-26 MED ORDER — FENTANYL CITRATE PF 50 MCG/ML IJ SOSY
PREFILLED_SYRINGE | INTRAMUSCULAR | Status: AC
  Filled 2023-06-26: qty 1

## 2023-06-26 MED ORDER — KETOROLAC TROMETHAMINE 10 MG PO TABS: 10.0000 mg | ORAL_TABLET | Freq: Three times a day (TID) | ORAL | 0 refills | Status: AC | PRN

## 2023-06-26 MED ORDER — FENTANYL CITRATE PF 50 MCG/ML IJ SOSY
50.0000 ug | PREFILLED_SYRINGE | INTRAMUSCULAR | Status: DC
  Administered 2023-06-26: 100 ug via INTRAVENOUS
  Filled 2023-06-26: qty 2

## 2023-06-26 MED ORDER — MEPERIDINE HCL 50 MG/ML IJ SOLN: 6.2500 mg | INTRAMUSCULAR | Status: DC | PRN

## 2023-06-26 MED ORDER — ONDANSETRON HCL 4 MG/2ML IJ SOLN
INTRAMUSCULAR | Status: DC | PRN
  Administered 2023-06-26: 4 mg via INTRAVENOUS

## 2023-06-26 MED ORDER — FENTANYL CITRATE (PF) 100 MCG/2ML IJ SOLN
INTRAMUSCULAR | Status: AC
  Filled 2023-06-26: qty 2

## 2023-06-26 MED ORDER — OXYCODONE HCL 5 MG PO TABS: 5.0000 mg | ORAL_TABLET | Freq: Four times a day (QID) | ORAL | 0 refills | Status: DC | PRN

## 2023-06-26 MED ORDER — LIDOCAINE HCL (PF) 2 % IJ SOLN
INTRAMUSCULAR | Status: AC
  Filled 2023-06-26: qty 5

## 2023-06-26 MED ORDER — PROPOFOL 10 MG/ML IV BOLUS
INTRAVENOUS | Status: DC | PRN
  Administered 2023-06-26: 200 mg via INTRAVENOUS

## 2023-06-26 MED ORDER — LIDOCAINE HCL (CARDIAC) PF 100 MG/5ML IV SOSY
PREFILLED_SYRINGE | INTRAVENOUS | Status: DC | PRN
  Administered 2023-06-26: 100 mg via INTRAVENOUS

## 2023-06-26 MED ORDER — DEXAMETHASONE SODIUM PHOSPHATE 10 MG/ML IJ SOLN
INTRAMUSCULAR | Status: AC
  Filled 2023-06-26: qty 1

## 2023-06-26 MED ORDER — ACETAMINOPHEN 500 MG PO TABS
1000.0000 mg | ORAL_TABLET | Freq: Once | ORAL | Status: AC
  Administered 2023-06-26: 1000 mg via ORAL
  Filled 2023-06-26: qty 2

## 2023-06-26 MED ORDER — ORAL CARE MOUTH RINSE: 15.0000 mL | Freq: Once | OROMUCOSAL | Status: AC

## 2023-06-26 MED ORDER — PHENYLEPHRINE HCL (PRESSORS) 10 MG/ML IV SOLN
INTRAVENOUS | Status: DC | PRN
Start: 2023-06-26 — End: 2023-06-26
  Administered 2023-06-26 (×2): 80 ug via INTRAVENOUS

## 2023-06-26 MED ORDER — ACETAMINOPHEN 160 MG/5ML PO SOLN: 325.0000 mg | ORAL | Status: DC | PRN

## 2023-06-26 MED ORDER — ONDANSETRON HCL 4 MG/2ML IJ SOLN: 4.0000 mg | Freq: Once | INTRAMUSCULAR | Status: DC | PRN

## 2023-06-26 MED ORDER — FENTANYL CITRATE PF 50 MCG/ML IJ SOSY
PREFILLED_SYRINGE | INTRAMUSCULAR | Status: AC
  Filled 2023-06-26: qty 2

## 2023-06-26 MED ORDER — ONDANSETRON HCL 4 MG/2ML IJ SOLN
INTRAMUSCULAR | Status: AC
  Filled 2023-06-26: qty 2

## 2023-06-26 MED ORDER — GENTAMICIN SULFATE 40 MG/ML IJ SOLN
320.0000 mg | INTRAVENOUS | Status: AC
  Administered 2023-06-26: 320 mg via INTRAVENOUS
  Filled 2023-06-26: qty 8

## 2023-06-26 MED ORDER — MIDAZOLAM HCL 2 MG/2ML IJ SOLN
INTRAMUSCULAR | Status: AC
  Filled 2023-06-26: qty 2

## 2023-06-26 MED ORDER — OXYCODONE HCL 5 MG/5ML PO SOLN
5.0000 mg | Freq: Once | ORAL | Status: AC | PRN
  Administered 2023-06-26: 5 mg via ORAL

## 2023-06-26 MED ORDER — FENTANYL CITRATE PF 50 MCG/ML IJ SOSY
25.0000 ug | PREFILLED_SYRINGE | INTRAMUSCULAR | Status: DC | PRN
  Administered 2023-06-26 (×3): 50 ug via INTRAVENOUS

## 2023-06-26 MED ORDER — SENNOSIDES-DOCUSATE SODIUM 8.6-50 MG PO TABS: 1.0000 | ORAL_TABLET | Freq: Two times a day (BID) | ORAL | 0 refills | Status: DC

## 2023-06-26 MED ORDER — LACTATED RINGERS IV SOLN: INTRAVENOUS | Status: DC

## 2023-06-26 MED ORDER — MIDAZOLAM HCL 5 MG/5ML IJ SOLN
INTRAMUSCULAR | Status: DC | PRN
  Administered 2023-06-26: 2 mg via INTRAVENOUS

## 2023-06-26 MED ORDER — OXYCODONE HCL 5 MG/5ML PO SOLN
ORAL | Status: AC
  Filled 2023-06-26: qty 5

## 2023-06-26 MED ORDER — IOHEXOL 300 MG/ML  SOLN
INTRAMUSCULAR | Status: DC | PRN
  Administered 2023-06-26: 8 mL

## 2023-06-26 MED ORDER — ACETAMINOPHEN 325 MG PO TABS: 325.0000 mg | ORAL_TABLET | ORAL | Status: DC | PRN

## 2023-06-26 MED ORDER — CELECOXIB 200 MG PO CAPS
200.0000 mg | ORAL_CAPSULE | Freq: Once | ORAL | Status: AC
  Administered 2023-06-26: 200 mg via ORAL
  Filled 2023-06-26: qty 1

## 2023-06-26 MED ORDER — SODIUM CHLORIDE 0.9 % IR SOLN
Status: DC | PRN
  Administered 2023-06-26: 3000 mL

## 2023-06-26 MED ORDER — PHENYLEPHRINE 80 MCG/ML (10ML) SYRINGE FOR IV PUSH (FOR BLOOD PRESSURE SUPPORT)
PREFILLED_SYRINGE | INTRAVENOUS | Status: AC
  Filled 2023-06-26: qty 10

## 2023-06-26 MED ORDER — FENTANYL CITRATE (PF) 100 MCG/2ML IJ SOLN
INTRAMUSCULAR | Status: DC | PRN
  Administered 2023-06-26 (×2): 50 ug via INTRAVENOUS

## 2023-06-26 MED ORDER — CHLORHEXIDINE GLUCONATE 0.12 % MT SOLN
15.0000 mL | Freq: Once | OROMUCOSAL | Status: AC
  Administered 2023-06-26: 15 mL via OROMUCOSAL

## 2023-06-26 SURGICAL SUPPLY — 22 items
BAG URO CATCHER STRL LF (MISCELLANEOUS) ×1 IMPLANT
BASKET LASER NITINOL 1.9FR (BASKET) IMPLANT
BSKT STON RTRVL 120 1.9FR (BASKET) ×1
CATH URETL OPEN END 6FR 70 (CATHETERS) ×1 IMPLANT
CLOTH BEACON ORANGE TIMEOUT ST (SAFETY) ×1 IMPLANT
EXTRACTOR STONE 1.7FRX115CM (UROLOGICAL SUPPLIES) IMPLANT
GLOVE SURG LX STRL 7.5 STRW (GLOVE) ×1 IMPLANT
GOWN STRL REUS W/ TWL XL LVL3 (GOWN DISPOSABLE) ×1 IMPLANT
GOWN STRL REUS W/TWL XL LVL3 (GOWN DISPOSABLE) ×1
GUIDEWIRE ANG ZIPWIRE 038X150 (WIRE) ×1 IMPLANT
GUIDEWIRE STR DUAL SENSOR (WIRE) ×1 IMPLANT
KIT TURNOVER KIT A (KITS) IMPLANT
LASER FIB FLEXIVA PULSE ID 365 (Laser) IMPLANT
MANIFOLD NEPTUNE II (INSTRUMENTS) ×1 IMPLANT
PACK CYSTO (CUSTOM PROCEDURE TRAY) ×1 IMPLANT
SHEATH NAVIGATOR HD 11/13X28 (SHEATH) IMPLANT
SHEATH NAVIGATOR HD 11/13X36 (SHEATH) IMPLANT
TRACTIP FLEXIVA PULS ID 200XHI (Laser) IMPLANT
TRACTIP FLEXIVA PULSE ID 200 (Laser)
TUBE PU 8FR 16IN ENFIT (TUBING) ×1 IMPLANT
TUBING CONNECTING 10 (TUBING) ×1 IMPLANT
TUBING UROLOGY SET (TUBING) ×1 IMPLANT

## 2023-06-26 NOTE — Brief Op Note (Signed)
06/26/2023  4:12 PM  PATIENT:  Tharon Aquas  37 y.o. female  PRE-OPERATIVE DIAGNOSIS:  LEFT URETERAL STONE  POST-OPERATIVE DIAGNOSIS:  LEFT URETERAL STONE  PROCEDURE:  Procedure(s): CYSTOSCOPY WITH LEFT RETROGRADE LEFT URETEROSCOPY WITH BASKETING OF STONE (Left)  SURGEON:  Surgeons and Role:    * Amal Saiki, Delbert Phenix., MD - Primary  PHYSICIAN ASSISTANT:   ASSISTANTS: none   ANESTHESIA:   general  EBL:  0 mL   BLOOD ADMINISTERED:none  DRAINS: none   LOCAL MEDICATIONS USED:  NONE  SPECIMEN:  Source of Specimen:  Left ureteral stone  DISPOSITION OF SPECIMEN:   [facility] for compositional analysis  COUNTS:  YES  TOURNIQUET:  * No tourniquets in log *  DICTATION: .Other Dictation: Dictation Number [id number]  PLAN OF CARE: Discharge to home after PACU  PATIENT DISPOSITION:  PACU - hemodynamically stable.   Delay start of Pharmacological VTE agent (>24hrs) due to surgical blood loss or risk of bleeding: not applicable

## 2023-06-26 NOTE — Anesthesia Preprocedure Evaluation (Signed)
Anesthesia Evaluation  Patient identified by MRN, date of birth, ID band Patient awake    Reviewed: Allergy & Precautions, NPO status , Patient's Chart, lab work & pertinent test results, reviewed documented beta blocker date and time   History of Anesthesia Complications Negative for: history of anesthetic complications  Airway Mallampati: I  TM Distance: >3 FB     Dental no notable dental hx.    Pulmonary neg sleep apnea, neg COPD, neg recent URI, former smoker   breath sounds clear to auscultation       Cardiovascular (-) hypertension(-) CAD, (-) Past MI and (-) CABG (-) dysrhythmias (-) pacemaker(-) Cardiac Defibrillator (-) Valvular Problems/Murmurs Rhythm:Regular Rate:Normal     Neuro/Psych  Headaches, neg Seizures PSYCHIATRIC DISORDERS Anxiety  Bipolar Disorder      GI/Hepatic ,neg GERD  ,,(+) neg Cirrhosis        Endo/Other    Renal/GU Renal disease (stone disease)     Musculoskeletal   Abdominal   Peds  Hematology  (+) Blood dyscrasia, anemia   Anesthesia Other Findings   Reproductive/Obstetrics                              Anesthesia Physical Anesthesia Plan  ASA: 2  Anesthesia Plan: General   Post-op Pain Management:    Induction: Intravenous  PONV Risk Score and Plan: 1 and Ondansetron  Airway Management Planned: LMA  Additional Equipment: None  Intra-op Plan:   Post-operative Plan: Extubation in OR  Informed Consent: I have reviewed the patients History and Physical, chart, labs and discussed the procedure including the risks, benefits and alternatives for the proposed anesthesia with the patient or authorized representative who has indicated his/her understanding and acceptance.     Dental advisory given  Plan Discussed with: CRNA and Anesthesiologist  Anesthesia Plan Comments:          Anesthesia Quick Evaluation

## 2023-06-26 NOTE — Discharge Instructions (Addendum)
1 - You may have urinary urgency (bladder spasms) and bloody urine on / off for up to a week. This is normal.  2 - Call MD or go to ER for fever >102, severe pain / nausea / vomiting not relieved by medications, or acute change in medical status

## 2023-06-26 NOTE — Op Note (Signed)
NAME: PACEHeyab, Gauntt MEDICAL RECORD NO: [id number] ACCOUNT NO: [id number] DATE OF BIRTH: [birth year] FACILITY: [facility] LOCATION: [facility] PHYSICIAN: Sebastian Ache, MD  Operative Report   DATE OF PROCEDURE: 06/26/2023  PREOPERATIVE DIAGNOSIS:  Left ureteral stone.  PROCEDURE PERFORMED: 1.  Cystoscopy, left retrograde pyelogram interpretation 2.  Left ureteroscopy with basketing of stone. 3. Left ureteral stent removal.  ESTIMATED BLOOD LOSS:  Nil.  COMPLICATIONS:  None.  SPECIMEN:  Left ureteral stone for composition analysis.  FINDINGS:   1.  Interval excellent passive dilation of left proximal ureter. 2.  Small left proximal ureteral stone amenable to simple basketing. 3.  Successful removal of left ureteral stent.  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  She underwent attempt at definitive management with ureteroscopy at that time.  However, her  proximal ureter was quite narrow not in a strictured fashion, but in a physiologic fashion such that it would not accommodate the ureteroscope to the level of the stone. As it was felt the safest means of management would be to place a stent allow for  passive dilation and reattempt. She presents for reattempt today.  Informed consent was obtained and placed in medical record.  PROCEDURE IN DETAIL:  The patient being identified and verified, procedure being left ureteroscopic stone manipulation was confirmed.  Procedure timeout was performed.  Intravenous antibiotics administered.  General LMA anesthesia induced.  The patient  was placed into a low lithotomy position.  Sterile field was created, prepped and draped the patient's vagina, introitus, and proximal thighs using iodine.  Cystourethroscopy was performed using 21-French rigid cystoscope with offset lens.  Inspection of  urinary bladder revealed no diverticula,  calcifications or papillary lesions.  Distal end of left ureteral stent was seen in situ was grasped, brought to the level of the urethral meatus.  A 0.038 ZIPwire was advanced to the level of the ureter. Stent  was exchanged for open-ended catheter and left retrograde pyelogram was obtained.  Left retrograde pyelogram demonstrated a single left ureter, single system left kidney.  No obvious filling defects or narrowing noted.  No hydronephrosis noted.  A ZIPwire was once again advanced and set aside as a safety wire.  An 8-French feeding tube  placed in the urinary bladder for pressure release and semirigid ureteroscopy performed of the distal four-fifths left ureter alongside a separate sensor working wire.  No mucosal abnormalities were found.  Stone in question was not seen.  There was  significant interval passive dilation with more proximal ureter being much more capacious.  The semirigid ureteroscope was then exchanged for the single channel ureteroscope over the sensor working wire to the level of the kidney using fluoroscopic  guidance and flexible digital ureteroscopy was performed. The entire kidney including all calices x3.  A small stone encountered in the upper mid calix likely corresponding to retrograde positioning prior ureteral stone.  No additional calcifications  were noted.  This did appear amenable to simple basketing.  As it was quite fusiform it was grasped on its long axis and carefully navigated out in its entirety set aside for composition analysis.  Hemostasis was excellent.  The ureter sustained  essentially nil trauma during the procedure today, it was felt that patient can safely be managed with a non-stenting technique.  Therefore, the stent was not exchanged.  Bladder was empty per cystoscope sheath and the procedure was terminated.  The  patient tolerated procedure well, no immediate perioperative complications.  The patient was taken to postanesthesia care in stable  condition.  Plan for discharge home.   PUS D: 06/26/2023 4:17:07 pm T: 06/26/2023 8:30:00 pm  JOB: [id number]/ [id number]

## 2023-06-26 NOTE — Anesthesia Postprocedure Evaluation (Signed)
Anesthesia Post Note  Patient: Lauren Gonzalez  Procedure(s) Performed: CYSTOSCOPY WITH LEFT RETROGRADE LEFT URETEROSCOPY WITH BASKETING OF STONE (Left: Ureter)     Patient location during evaluation: PACU Anesthesia Type: General Level of consciousness: awake and alert Pain management: pain level controlled Vital Signs Assessment: post-procedure vital signs reviewed and stable Respiratory status: spontaneous breathing, nonlabored ventilation, respiratory function stable and patient connected to nasal cannula oxygen Cardiovascular status: blood pressure returned to baseline and stable Postop Assessment: no apparent nausea or vomiting Anesthetic complications: no   No notable events documented.  Last Vitals:  Vitals:   06/26/23 1706 06/26/23 1721  BP:  135/79  Pulse: 82 72  Resp: 13 14  Temp:  36.6 C  SpO2: 95% 100%    Last Pain:  Vitals:   06/26/23 1721  TempSrc: Oral  PainSc: 4                  Chae Shuster

## 2023-06-26 NOTE — Transfer of Care (Signed)
Immediate Anesthesia Transfer of Care Note  Patient: Tharon Aquas  Procedure(s) Performed: CYSTOSCOPY WITH LEFT RETROGRADE LEFT URETEROSCOPY WITH BASKETING OF STONE (Left: Ureter)  Patient Location: PACU  Anesthesia Type:General  Level of Consciousness: awake and alert   Airway & Oxygen Therapy: Patient Spontanous Breathing  Post-op Assessment: Report given to RN  Post vital signs: Reviewed and stable  Last Vitals:  Vitals Value Taken Time  BP    Temp    Pulse 111 06/26/23 1627  Resp 25 06/26/23 1627  SpO2 94 % 06/26/23 1627  Vitals shown include unfiled device data.  Last Pain:  Vitals:   06/26/23 1405  TempSrc:   PainSc: 2       Patients Stated Pain Goal: 3 (06/26/23 1330)  Complications: No notable events documented.

## 2023-06-26 NOTE — H&P (Signed)
Lauren Gonzalez is an 37 y.o. female.    Chief Complaint: Pre-Op LEFT Ureteroscopic Stone Manipulation  HPI:   1 - Left Ureteral Stone - 3mm left proximal stone on ER CT on eval flank pain and nausea. Colic difficult to control. Admitted for trial of medical therapy 8/25 but failure to pass stone still with challenging colic. UA without infectious parameters, Cr 0.7. Attempted ureteroscopy on 8/27 however proximal ureter very narrow and would not accommodate even single channel scope therefore stented to allow passive dilation.   PMH sig for lap chole, ENT surgery x several. She is paramedic for [employer], used to work in [employer] OR. Her mother Lauren Gonzalez is also a stone patient of mine.   Today "Lauren Gonzalez" is seen to proceed with LEFT ureteroscopy for proximal stone, she has been stented to allow passive dilation. NO interval fevers.   Past Medical History:  Diagnosis Date   ADD (attention deficit disorder) 12/29/2013   Allergic rhinitis, cause unspecified    DISORDER, BIPOLAR NEC    DVT (deep venous thrombosis) (HCC)    in leg July 2020 (vaginal ring discontinued after blood clot/started on Xarelto - off now)   History of kidney stones    Insomnia, unspecified    MIGRAINE HEADACHE     Past Surgical History:  Procedure Laterality Date   CHOLECYSTECTOMY     CYSTOSCOPY WITH RETROGRADE PYELOGRAM, URETEROSCOPY AND STENT PLACEMENT Left 06/04/2023   Procedure: CYSTOSCOPY WITH RETROGRADE PYELOGRAM, DIAGNOSTIC URETEROSCOPY AND STENT PLACEMENT;  Surgeon: Loletta Parish., MD;  Location: [facility];  Service: Urology;  Laterality: Left;   FRENULECTOMY, LINGUAL     of the base of the tongue   s/p left hip labral tear surgury  dec. 2010   TONSILLECTOMY     TONSILLECTOMY N/A 03/22/2018   Procedure: CAUTERY OF POST TONSILLAR BLEEDING;  Surgeon: Christia Reading, MD;  Location: [facility] OR;  Service: ENT;  Laterality: N/A;   TYMPANOSTOMY TUBE PLACEMENT     as a child   WISDOM TOOTH EXTRACTION      Family  History  Problem Relation Age of Onset   Colon polyps Father    Depression Other    Diabetes Other    Stroke Other    Heart disease Other    Cancer Other        lung and ovarian cancer   Arthritis Other    Social History:  reports that she has quit smoking. She has never used smokeless tobacco. She reports that she does not currently use alcohol. She reports current drug use. Drug: Marijuana.  Allergies:  Allergies  Allergen Reactions   Plum Pulp Anaphylaxis, Itching, Swelling and Other (See Comments)    Throat became swollen   Influenza Vaccines Swelling, Rash and Other (See Comments)    Rash and swelling/redness in area of injection.   Levaquin [Levofloxacin] Hives and Rash    No medications prior to admission.    No results found for this or any previous visit (from the past 48 hour(s)). No results found.  Review of Systems  Constitutional:  Negative for chills and fever.  Genitourinary:  Positive for urgency.  All other systems reviewed and are negative.   Last menstrual period 05/19/2023. Physical Exam Vitals reviewed.  HENT:     Head: Normocephalic.     Nose: Nose normal.  Eyes:     Pupils: Pupils are equal, round, and reactive to light.  Cardiovascular:     Rate and Rhythm: Normal rate.  Pulmonary:     Effort: Pulmonary effort is normal.  Abdominal:     General: Abdomen is flat.  Genitourinary:    Comments: No CVAT Musculoskeletal:        General: Normal range of motion.     Cervical back: Normal range of motion.  Skin:    General: Skin is warm.  Neurological:     General: No focal deficit present.     Mental Status: She is alert.  Psychiatric:        Mood and Affect: Mood normal.      Assessment/Plan  Proceed as planned with LEFT ureteroscopic stone manipulation. Risks, benefits, alternatives, expected peri-op course discussed previously and reiterated today.   Loletta Parish., MD 06/26/2023, 5:45 AM

## 2023-06-27 ENCOUNTER — Encounter (HOSPITAL_COMMUNITY): Payer: Self-pay | Admitting: Urology

## 2023-07-09 DIAGNOSIS — N202 Calculus of kidney with calculus of ureter: Secondary | ICD-10-CM | POA: Diagnosis not present

## 2023-07-22 NOTE — Progress Notes (Deleted)
NEUROLOGY FOLLOW UP OFFICE NOTE  Lauren Gonzalez [id number]  Assessment/Plan:   Migraine without aura, without status migrainosus, not intractable   1.  Migraine prevention:  Botox 2.  Migraine rescue:  Ubrelvy 100mg  3.  Limit use of pain relievers to no more than 2 days out of week to prevent risk of rebound or medication-overuse headache. 4.  Keep headache diary 5.  Follow up for routine visit in one year  Subjective:  Lauren Gonzalez is a 37 year old female with history of DVT who follows up for migraines.   UPDATE: Intensity:  Mild (rarely severe) Duration:  couple of hours to a day Frequency:  3 in 3 months (usually within the last month prior to Botox). May still have some slight nausea and eyes feel tired. Able to function.   She previously talked about increased jaw stiffness - difficulty opening mouth when eating.  Last botox I raised where I injected one of the temporal injections and has noted improvement.  Still with mild difficulty. Rescue therapy: Ubrelvy.  Current NSAIDS:  ibuprofen Current analgesics:  none Current triptans:  none Current ergotamine:  none Current anti-emetic:  Phenergan PR Current muscle relaxants:  Cyclobenzaprine 5mg  Current anti-anxiolytic:  Clonazepam 1mg  twice daily PRN Current sleep aide:  Ambien Current Antihypertensive medications:  none Current Antidepressant medications:  none Current Anticonvulsant medications:  none Current anti-CGRP:  Ubrelvy Current Vitamins/Herbal/Supplements:  none Current Antihistamines/Decongestants:  none Other therapy:  Botox Hormone/birth control:  Norlyda (progesterone)   Caffeine:  1 cup coffee a week.  Energy drink infrequently.  Occasional Pepsi Diet:  At least 64 oz water.  Occasional soda (Sprite, orange soda) Exercise:  Not routine Depression and Anxiety.  Has Bipolar disorder.  Currently stable. Other pain:  no Sleep hygiene:  Good when she is not working.  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  Worse over past year, worse related to menstrual cycle. Location:  Starts  Unilateral (either side) and the becomes bilatera, frontal, face, temples, radiates to back of head Quality:  Starts as pressure and becomes pounding/throbbing Initial intensity:  Severe.  She denies new headache, thunderclap headache or severe headache that wakes her from sleep. Aura:  none Premonitory Phase:  Feels slightly "off"  Postdrome:  Tired Associated symptoms:  Nausea, vomiting, photophobia, phonophobia, osmophobia, hair and teeth hurt.  She denies associated unilateral numbness or weakness. Initial duration:  All day (sometimes in to next day) Initial frequency:  Mild-moderate migraine just before her period.  Severe during her period, may have one other one outside period.  (total 4 days a month) Initial frequency of abortive medication: 4 days a month Triggers:  Menses, weather changes, certain smells, emotional stress, caffeine-withdrawal, sleep deprivation) Relieving factors:  Coolness, dark place Activity: aggravates   MRI of brain with and without contrat from 11/09/04 personally reviewed and was normal.     Past NSAIDS:  Cambia, naproxen Past analgesics:  Excedrin, BC powder; Tylenol #3 Past abortive triptans:  Sumatriptan 100mg , Zomig 5mg  NS (effective but felt in a daze all day), Frova 2.5mg  (helpful but expensive) Past abortive ergotamine:  none Past muscle relaxants:  Robaxin Past anti-emetic:  promethazine Past antihypertensive medications:  none Past antidepressant/antipsychotic medications:  Sertraline, Vyanse Past anticonvulsant medications:  topiramate 50mg  twice daily (paresthesias) Past anti-CGRP:  Aimovig (caused elevated blood pressure) Past vitamins/Herbal/Supplements:  Magnesium, CoQ10 Past antihistamines/decongestants:  Zyrtec Other past therapies:  Changing birth control from Nuvaring to progesterone,  trigger point injections/nerve blocks     Family history of headache:  Dad (migraines, resolved)  PAST MEDICAL HISTORY: Past Medical History:  Diagnosis Date   ADD (attention deficit disorder) 12/29/2013   Allergic rhinitis, cause unspecified    DISORDER, BIPOLAR NEC    DVT (deep venous thrombosis) (HCC)    in leg July 2020 (vaginal ring discontinued after blood clot/started on Xarelto - off now)   History of kidney stones    Insomnia, unspecified    MIGRAINE HEADACHE     MEDICATIONS: Current Outpatient Medications on File Prior to Visit  Medication Sig Dispense Refill   albuterol (PROVENTIL HFA;VENTOLIN HFA) 108 (90 Base) MCG/ACT inhaler Inhale 2 puffs into the lungs every 6 (six) hours as needed for wheezing or shortness of breath. 1 Inhaler 0   Ascorbic Acid (VITAMIN C) 1000 MG tablet Take 1,000 mg by mouth daily.     botulinum toxin Type A (BOTOX) 200 units injection Inject 155 units IM into multiple site in the face,neck and head once every 90 days (Patient taking differently: Inject 155 Units into the muscle See admin instructions. Inject 155 units IM into multiple sites in the face,neck and head once every 90 days) 1 each 4   ketorolac (TORADOL) 10 MG tablet Take 1 tablet (10 mg total) by mouth every 8 (eight) hours as needed for moderate pain (post-operatively). 20 tablet 0   Multiple Vitamin (MULTIVITAMIN WITH MINERALS) TABS tablet Take 2 tablets by mouth daily.     OVER THE COUNTER MEDICATION Take 3 capsules by mouth daily. Beef Organ complex     OVER THE COUNTER MEDICATION Take 2 capsules by mouth daily. Mushroom complex supplement     oxyCODONE (OXY IR/ROXICODONE) 5 MG immediate release tablet Take 1 tablet (5 mg total) by mouth every 6 (six) hours as needed for severe pain (post-operatively). 06/11/23: reports during TOC call was prescribed at time of hospital discharge on 06/05/23 10 tablet 0   senna-docusate (SENOKOT-S) 8.6-50 MG tablet Take 1 tablet by mouth 2 (two) times  daily. While taking strongest pain meds to prevent constipation 10 tablet 0   Specialty Vitamins Products (ADVANCED COLLAGEN PO) Take 3 capsules by mouth daily. Collagen with hyaluronic acid and biotin     Ubrogepant (UBRELVY) 100 MG TABS Take 1 tablet by mouth as needed (May repeat 1 tablet after 2 hours if needed.  Maximum 2 tablets in 24 hours). 16 tablet 3   No current facility-administered medications on file prior to visit.    ALLERGIES: Allergies  Allergen Reactions   Plum Pulp Anaphylaxis, Itching, Swelling and Other (See Comments)    Throat became swollen   Influenza Vaccines Swelling, Rash and Other (See Comments)    Rash and swelling/redness in area of injection.   Levaquin [Levofloxacin] Hives and Rash    FAMILY HISTORY: Family History  Problem Relation Age of Onset   Colon polyps Father    Depression Other    Diabetes Other    Stroke Other    Heart disease Other    Cancer Other        lung and ovarian cancer   Arthritis Other       Objective:  *** General: No acute distress.  Patient appears ***-groomed.   Head:  Normocephalic/atraumatic Eyes:  Fundi examined but not visualized Neck: supple, no paraspinal tenderness, full range of motion Heart:  Regular rate and rhythm Lungs:  Clear to auscultation bilaterally Back: No  paraspinal tenderness Neurological Exam: alert and oriented.  Speech fluent and not dysarthric, language intact.  CN II-XII intact. Bulk and tone normal, muscle strength 5/5 throughout.  Sensation to light touch intact.  Deep tendon reflexes 2+ throughout, toes downgoing.  Finger to nose testing intact.  Gait normal, Romberg negative.   Shon Millet, DO  CC: ***

## 2023-07-23 ENCOUNTER — Ambulatory Visit: Payer: BC Managed Care – PPO | Admitting: Neurology

## 2023-07-31 DIAGNOSIS — M546 Pain in thoracic spine: Secondary | ICD-10-CM | POA: Diagnosis not present

## 2023-07-31 DIAGNOSIS — W19XXXA Unspecified fall, initial encounter: Secondary | ICD-10-CM | POA: Diagnosis not present

## 2023-07-31 DIAGNOSIS — M542 Cervicalgia: Secondary | ICD-10-CM | POA: Diagnosis not present

## 2023-07-31 DIAGNOSIS — W1830XA Fall on same level, unspecified, initial encounter: Secondary | ICD-10-CM | POA: Diagnosis not present

## 2023-08-05 NOTE — Progress Notes (Unsigned)
NEUROLOGY FOLLOW UP OFFICE NOTE  BISCEGLIA PYEATT [id number]  Assessment/Plan:   Migraine without aura, without status migrainosus, not intractable   1.  Migraine prevention:  Botox 2.  Migraine rescue:  Bernita Raisin 100mg  3.  Limit use of pain relievers to no more than 2 days out of week to prevent risk of rebound or medication-overuse headache. 4.  Keep headache diary 5.  Follow up for routine visit in one year  Subjective:  Lauren Gonzalez is a 37 year old female with history of DVT who follows up for migraines.   UPDATE: Intensity:  Mild (rarely severe) Duration:  couple of hours to a day Frequency:  3 in 3 months (usually within the last month prior to Botox). May still have some slight nausea and eyes feel tired. Able to function.   She previously talked about increased jaw stiffness - difficulty opening mouth when eating.  Last botox I raised where I injected one of the temporal injections and has noted improvement.  Still with mild difficulty. Rescue therapy: Ubrelvy.  Current NSAIDS:  ibuprofen Current analgesics:  none Current triptans:  none Current ergotamine:  none Current anti-emetic:  Phenergan PR Current muscle relaxants:  Cyclobenzaprine 5mg  Current anti-anxiolytic:  Clonazepam 1mg  twice daily PRN Current sleep aide:  Ambien Current Antihypertensive medications:  none Current Antidepressant medications:  none Current Anticonvulsant medications:  none Current anti-CGRP:  Ubrelvy Current Vitamins/Herbal/Supplements:  none Current Antihistamines/Decongestants:  none Other therapy:  Botox Hormone/birth control:  Norlyda (progesterone)   Caffeine:  1 cup coffee a week.  Energy drink infrequently.  Occasional Pepsi Diet:  At least 64 oz water.  Occasional soda (Sprite, orange soda) Exercise:  Not routine Depression and Anxiety.  Has Bipolar disorder.  Currently stable. Other pain:  no Sleep hygiene:  Good when she is not working.  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  Worse over past year, worse related to menstrual cycle. Location:  Starts  Unilateral (either side) and the becomes bilatera, frontal, face, temples, radiates to back of head Quality:  Starts as pressure and becomes pounding/throbbing Initial intensity:  Severe.  She denies new headache, thunderclap headache or severe headache that wakes her from sleep. Aura:  none Premonitory Phase:  Feels slightly "off"  Postdrome:  Tired Associated symptoms:  Nausea, vomiting, photophobia, phonophobia, osmophobia, hair and teeth hurt.  She denies associated unilateral numbness or weakness. Initial duration:  All day (sometimes in to next day) Initial frequency:  Mild-moderate migraine just before her period.  Severe during her period, may have one other one outside period.  (total 4 days a month) Initial frequency of abortive medication: 4 days a month Triggers:  Menses, weather changes, certain smells, emotional stress, caffeine-withdrawal, sleep deprivation) Relieving factors:  Coolness, dark place Activity: aggravates   MRI of brain with and without contrat from 11/09/04 personally reviewed and was normal.     Past NSAIDS:  Cambia, naproxen Past analgesics:  Excedrin, BC powder; Tylenol #3 Past abortive triptans:  Sumatriptan 100mg , Zomig 5mg  NS (effective but felt in a daze all day), Frova 2.5mg  (helpful but expensive) Past abortive ergotamine:  none Past muscle relaxants:  Robaxin Past anti-emetic:  promethazine Past antihypertensive medications:  none Past antidepressant/antipsychotic medications:  Sertraline, Vyanse Past anticonvulsant medications:  topiramate 50mg  twice daily (paresthesias) Past anti-CGRP:  Aimovig (caused elevated blood pressure) Past vitamins/Herbal/Supplements:  Magnesium, CoQ10 Past antihistamines/decongestants:  Zyrtec Other past therapies:  Changing birth control from Nuvaring to progesterone,  trigger point injections/nerve blocks     Family history of headache:  Dad (migraines, resolved)  PAST MEDICAL HISTORY: Past Medical History:  Diagnosis Date   ADD (attention deficit disorder) 12/29/2013   Allergic rhinitis, cause unspecified    DISORDER, BIPOLAR NEC    DVT (deep venous thrombosis) (HCC)    in leg July 2020 (vaginal ring discontinued after blood clot/started on Xarelto - off now)   History of kidney stones    Insomnia, unspecified    MIGRAINE HEADACHE     MEDICATIONS: Current Outpatient Medications on File Prior to Visit  Medication Sig Dispense Refill   albuterol (PROVENTIL HFA;VENTOLIN HFA) 108 (90 Base) MCG/ACT inhaler Inhale 2 puffs into the lungs every 6 (six) hours as needed for wheezing or shortness of breath. 1 Inhaler 0   Ascorbic Acid (VITAMIN C) 1000 MG tablet Take 1,000 mg by mouth daily.     botulinum toxin Type A (BOTOX) 200 units injection Inject 155 units IM into multiple site in the face,neck and head once every 90 days (Patient taking differently: Inject 155 Units into the muscle See admin instructions. Inject 155 units IM into multiple sites in the face,neck and head once every 90 days) 1 each 4   ketorolac (TORADOL) 10 MG tablet Take 1 tablet (10 mg total) by mouth every 8 (eight) hours as needed for moderate pain (post-operatively). 20 tablet 0   Multiple Vitamin (MULTIVITAMIN WITH MINERALS) TABS tablet Take 2 tablets by mouth daily.     OVER THE COUNTER MEDICATION Take 3 capsules by mouth daily. Beef Organ complex     OVER THE COUNTER MEDICATION Take 2 capsules by mouth daily. Mushroom complex supplement     oxyCODONE (OXY IR/ROXICODONE) 5 MG immediate release tablet Take 1 tablet (5 mg total) by mouth every 6 (six) hours as needed for severe pain (post-operatively). 06/11/23: reports during TOC call was prescribed at time of hospital discharge on 06/05/23 10 tablet 0   senna-docusate (SENOKOT-S) 8.6-50 MG tablet Take 1 tablet by mouth 2 (two) times  daily. While taking strongest pain meds to prevent constipation 10 tablet 0   Specialty Vitamins Products (ADVANCED COLLAGEN PO) Take 3 capsules by mouth daily. Collagen with hyaluronic acid and biotin     Ubrogepant (UBRELVY) 100 MG TABS Take 1 tablet by mouth as needed (May repeat 1 tablet after 2 hours if needed.  Maximum 2 tablets in 24 hours). 16 tablet 3   No current facility-administered medications on file prior to visit.    ALLERGIES: Allergies  Allergen Reactions   Plum Pulp Anaphylaxis, Itching, Swelling and Other (See Comments)    Throat became swollen   Influenza Vaccines Swelling, Rash and Other (See Comments)    Rash and swelling/redness in area of injection.   Levaquin [Levofloxacin] Hives and Rash    FAMILY HISTORY: Family History  Problem Relation Age of Onset   Colon polyps Father    Depression Other    Diabetes Other    Stroke Other    Heart disease Other    Cancer Other        lung and ovarian cancer   Arthritis Other       Objective:  *** General: No acute distress.  Patient appears ***-groomed.   Head:  Normocephalic/atraumatic Eyes:  Fundi examined but not visualized Neck: supple, no paraspinal tenderness, full range of motion Heart:  Regular rate and rhythm Lungs:  Clear to auscultation bilaterally Back: No  paraspinal tenderness Neurological Exam: alert and oriented.  Speech fluent and not dysarthric, language intact.  CN II-XII intact. Bulk and tone normal, muscle strength 5/5 throughout.  Sensation to light touch intact.  Deep tendon reflexes 2+ throughout, toes downgoing.  Finger to nose testing intact.  Gait normal, Romberg negative.   Shon Millet, DO  CC: ***

## 2023-08-06 ENCOUNTER — Encounter: Payer: Self-pay | Admitting: Neurology

## 2023-08-06 ENCOUNTER — Ambulatory Visit: Payer: BC Managed Care – PPO | Admitting: Neurology

## 2023-08-06 VITALS — BP 129/80 | HR 94 | Ht 65.0 in | Wt 169.0 lb

## 2023-08-06 DIAGNOSIS — G43009 Migraine without aura, not intractable, without status migrainosus: Secondary | ICD-10-CM | POA: Diagnosis not present

## 2023-08-06 NOTE — Patient Instructions (Signed)
Botox Ubrelvy first line.  If migraine persists after taking Bernita Raisin, try Trudhesa nasal spray - take as directed (prime 4 pumps and then 1 pump in each nostril).  May use a different device and repeat in 1 hour if needed.  Maximum 2 devices in 24 hours.   Follow up one year

## 2023-08-06 NOTE — Progress Notes (Signed)
Medication Samples have been provided to the patient.  Drug name: Hillery Hunter        Strength: 10 mg        Qty: 2  LOT:   Exp.Date:   Dosing instructions: as needed  The patient has been instructed regarding the correct time, dose, and frequency of taking this medication, including desired effects and most common side effects.   Leida Lauth 9:30 AM 08/06/2023

## 2023-08-08 ENCOUNTER — Telehealth: Payer: Self-pay | Admitting: Internal Medicine

## 2023-08-08 NOTE — Telephone Encounter (Signed)
Patient will pick up tomorrow from the [facility]

## 2023-08-08 NOTE — Telephone Encounter (Signed)
Patient would like to get her immunization records printed out.  Please call patient when she can pick them up.  [phone]

## 2023-08-20 ENCOUNTER — Other Ambulatory Visit (HOSPITAL_COMMUNITY): Payer: Self-pay

## 2023-08-20 ENCOUNTER — Telehealth: Payer: Self-pay | Admitting: Pharmacy Technician

## 2023-08-20 NOTE — Telephone Encounter (Signed)
Pharmacy Patient Advocate Encounter  Received notification from [facility] that Prior Authorization for UBRELVY 100MG  has been APPROVED from 08/20/2023 to 08/19/2024. Ran test claim, Copay is $0.00. This test claim was processed through [facility]- copay amounts may vary at other pharmacies due to pharmacy/plan contracts, or as the patient moves through the different stages of their insurance plan.

## 2023-08-20 NOTE — Telephone Encounter (Signed)
Pharmacy Patient Advocate Encounter   Received notification from CoverMyMeds that prior authorization for Ubrelvy 100MG  tablets is required/requested.   Insurance verification completed.   The patient is insured through [facility] .   Per test claim: PA required; PA submitted to above mentioned insurance via CoverMyMeds Key/confirmation #/EOC [id number] Status is pending

## 2023-08-28 DIAGNOSIS — G43009 Migraine without aura, not intractable, without status migrainosus: Secondary | ICD-10-CM | POA: Diagnosis not present

## 2023-08-28 DIAGNOSIS — R69 Illness, unspecified: Secondary | ICD-10-CM | POA: Diagnosis not present

## 2023-09-12 ENCOUNTER — Ambulatory Visit (INDEPENDENT_AMBULATORY_CARE_PROVIDER_SITE_OTHER): Payer: BC Managed Care – PPO | Admitting: Neurology

## 2023-09-12 DIAGNOSIS — G43009 Migraine without aura, not intractable, without status migrainosus: Secondary | ICD-10-CM | POA: Diagnosis not present

## 2023-09-12 MED ORDER — ONABOTULINUMTOXINA 100 UNITS IJ SOLR
200.0000 [IU] | Freq: Once | INTRAMUSCULAR | Status: AC
Start: 2023-09-12 — End: 2023-09-12
  Administered 2023-09-12: 155 [IU] via INTRAMUSCULAR

## 2023-09-12 NOTE — Progress Notes (Signed)
Medication Samples have been provided to the patient.  Drug name: Nurtec       Strength:   75 mg     Qty: 2  LOT: [id number]   Exp.Date: 11/27  Dosing instructions: as needed  The patient has been instructed regarding the correct time, dose, and frequency of taking this medication, including desired effects and most common side effects.   Leida Lauth 11:16 AM 09/12/2023

## 2023-09-12 NOTE — Progress Notes (Signed)

## 2023-10-17 DIAGNOSIS — Z130 Encounter for screening for diseases of the blood and blood-forming organs and certain disorders involving the immune mechanism: Secondary | ICD-10-CM | POA: Diagnosis not present

## 2023-10-17 DIAGNOSIS — Z124 Encounter for screening for malignant neoplasm of cervix: Secondary | ICD-10-CM | POA: Diagnosis not present

## 2023-10-17 DIAGNOSIS — Z01419 Encounter for gynecological examination (general) (routine) without abnormal findings: Secondary | ICD-10-CM | POA: Diagnosis not present

## 2023-11-18 DIAGNOSIS — G43009 Migraine without aura, not intractable, without status migrainosus: Secondary | ICD-10-CM | POA: Diagnosis not present

## 2023-12-12 ENCOUNTER — Ambulatory Visit: Payer: BC Managed Care – PPO | Admitting: Neurology

## 2023-12-12 DIAGNOSIS — G43709 Chronic migraine without aura, not intractable, without status migrainosus: Secondary | ICD-10-CM | POA: Diagnosis not present

## 2023-12-12 MED ORDER — ONABOTULINUMTOXINA 100 UNITS IJ SOLR
200.0000 [IU] | Freq: Once | INTRAMUSCULAR | Status: AC
Start: 2023-12-12 — End: 2023-12-12
  Administered 2023-12-12: 155 [IU] via INTRAMUSCULAR

## 2023-12-12 NOTE — Progress Notes (Signed)

## 2023-12-13 ENCOUNTER — Ambulatory Visit: Payer: BC Managed Care – PPO | Admitting: Neurology

## 2024-02-24 ENCOUNTER — Encounter: Payer: Self-pay | Admitting: Neurology

## 2024-02-24 ENCOUNTER — Telehealth: Payer: Self-pay | Admitting: Neurology

## 2024-02-24 ENCOUNTER — Ambulatory Visit

## 2024-02-24 DIAGNOSIS — G43009 Migraine without aura, not intractable, without status migrainosus: Secondary | ICD-10-CM

## 2024-02-24 MED ORDER — KETOROLAC TROMETHAMINE 60 MG/2ML IM SOLN
60.0000 mg | Freq: Once | INTRAMUSCULAR | Status: AC
Start: 2024-02-24 — End: 2024-02-24
  Administered 2024-02-24: 60 mg via INTRAMUSCULAR

## 2024-02-24 MED ORDER — METOCLOPRAMIDE HCL 5 MG/ML IJ SOLN
10.0000 mg | Freq: Once | INTRAMUSCULAR | Status: AC
Start: 2024-02-24 — End: 2024-02-24
  Administered 2024-02-24: 10 mg via INTRAMUSCULAR

## 2024-02-24 MED ORDER — DIPHENHYDRAMINE HCL 50 MG/ML IJ SOLN
50.0000 mg | Freq: Once | INTRAMUSCULAR | Status: AC
Start: 2024-02-24 — End: 2024-02-24
  Administered 2024-02-24: 25 mg via INTRAMUSCULAR

## 2024-02-24 NOTE — Telephone Encounter (Signed)
 Pt called in stating she has had a migraine since yesterday and nothing is taking it away. She had to call out of work today. She is wanting to see if she can get a headache cocktail?

## 2024-02-27 DIAGNOSIS — Z124 Encounter for screening for malignant neoplasm of cervix: Secondary | ICD-10-CM | POA: Diagnosis not present

## 2024-03-03 ENCOUNTER — Telehealth: Payer: Self-pay | Admitting: Neurology

## 2024-03-03 NOTE — Telephone Encounter (Signed)
 Pt called in stating she spoke with [facility] to get everything set up to have her botox  delivered, but she was told there was no refills on her prescription and prior authorization had not been done.

## 2024-03-04 ENCOUNTER — Telehealth: Payer: Self-pay | Admitting: Pharmacy Technician

## 2024-03-04 ENCOUNTER — Other Ambulatory Visit (HOSPITAL_COMMUNITY): Payer: Self-pay

## 2024-03-04 NOTE — Telephone Encounter (Signed)
 Pharmacy Patient Advocate Encounter  Pharmacy Benefit PA has been submitted for Botox - [phone] via CoverMyMeds.  INSURANCE[place] [employer] KEY/EOC/FAX: [id number] Procedure code 41660  Pending BotoxOne report require a PA Status is Submitted

## 2024-03-04 NOTE — Telephone Encounter (Signed)
 PA has been submitted, and telephone encounter has been created. Please see telephone encounter dated 5.28.25.

## 2024-03-04 NOTE — Telephone Encounter (Signed)
 Pharmacy Patient Advocate Encounter   Botox  One Portal verification has been Submitted Benefit Verification #:   [id number]   Primary Insurance: [employer] Dx Code: G43.709 J-code: Botox - B1478 Procedure code: 29562

## 2024-03-06 ENCOUNTER — Other Ambulatory Visit (HOSPITAL_COMMUNITY): Payer: Self-pay

## 2024-03-06 NOTE — Telephone Encounter (Signed)
 Pharmacy Patient Advocate Encounter- Injection via Medical Benefit:  Pharmacy provided J code: Botox - X9147 CPT code: 82956 Dx Code: G43.709  PA was submitted to [facility] and has been approved through: 5.28.25 - 4.29.26 Authorization# [id number]  Please send prescription to Specialty Pharmacy: [facility]: [phone] Estimated Patient cost is: ?  Patient IS eligible for Botox - K4401 Copay Card. Copay Card can make patient's cost as little as zero. Copay card will be provided to pharmacy.   Pharmacy Patient Advocate Encounter  Botox  One Portal verification has been completed.  Benefit Verification #:  [id number]   Dx Code: G43.709  J-code: Botox - U2725 Does require PA Procedure code: 36644 Does Not require PA  Primary Insurance: [employer] Patient's remaining deductible: $0 Patient estimated coinsurance/copay for med: 0% / $0 Patient estimated coinsurance/copay for admin code: 0% / $25  *Benefit info changes as patient continues to use their insurance benefits*

## 2024-03-09 MED ORDER — BOTOX 200 UNITS IJ SOLR: 155.0000 [IU] | INTRAMUSCULAR | 4 refills | Status: AC

## 2024-03-20 ENCOUNTER — Ambulatory Visit: Admitting: Neurology

## 2024-03-24 NOTE — Telephone Encounter (Addendum)
 [facility] needs added on PA approval. Called [employer], rep advised they no longer are able to update approved auths- new PA needs submitted.Faxed [employer] fax form to [phone].  Phone# [phone]  Routing to team for follow up

## 2024-03-24 NOTE — Telephone Encounter (Signed)
 [facility] needs added on PA approval. Called [employer], rep advised they no longer are able to update approved auths via phone- new PA needs submitted. Faxed [employer] fax form to [phone].  Phone# [phone]

## 2024-03-24 NOTE — Telephone Encounter (Signed)
 [facility] called in stating the botox  auth has to have the servicing provider has to be [facility].

## 2024-03-25 ENCOUNTER — Telehealth: Payer: Self-pay

## 2024-03-25 NOTE — Telephone Encounter (Signed)
 LMOVM for patient, need to move appt.  Due to [facility] not on PA letter.  PA team reached out to [facility] to add them. Waiting on Response.

## 2024-03-26 NOTE — Telephone Encounter (Signed)
 Pharmacy Patient Advocate Encounter- Injection via Pharmacy Benefit:  PA was submitted  for Botox - J0585 [facility] and has been approved through: 6.17.25 to 5.19.26 Authorization# [id number]  Please send prescription to Specialty Pharmacy: [facility]: [phone] Estimated Pharmacy Copay is: ?  Patient IS eligible for Botox - N5621 Copay Card, which will make patient's copay as little as zero. Copay card will be provided to pharmacy.  Pharmacy provided J code: Botox - [id number] CPT code: 30865 Dx Code: G43.709

## 2024-03-26 NOTE — Telephone Encounter (Signed)
 Spoke to rep Andres Bangs at [facility] Approval letter needed, Never received the one from this morning.   Delivery set for 03/31/24.  Fax # [phone]  Letter re faxed

## 2024-03-27 ENCOUNTER — Ambulatory Visit: Admitting: Neurology

## 2024-03-30 DIAGNOSIS — G43009 Migraine without aura, not intractable, without status migrainosus: Secondary | ICD-10-CM | POA: Diagnosis not present

## 2024-04-03 ENCOUNTER — Ambulatory Visit: Admitting: Neurology

## 2024-04-03 DIAGNOSIS — G43709 Chronic migraine without aura, not intractable, without status migrainosus: Secondary | ICD-10-CM | POA: Diagnosis not present

## 2024-04-03 MED ORDER — ONABOTULINUMTOXINA 100 UNITS IJ SOLR
200.0000 [IU] | Freq: Once | INTRAMUSCULAR | Status: AC
Start: 2024-04-03 — End: 2024-04-03
  Administered 2024-04-03: 155 [IU] via INTRAMUSCULAR

## 2024-04-03 NOTE — Progress Notes (Signed)

## 2024-06-22 DIAGNOSIS — G43009 Migraine without aura, not intractable, without status migrainosus: Secondary | ICD-10-CM | POA: Diagnosis not present

## 2024-06-26 ENCOUNTER — Ambulatory Visit: Payer: Self-pay | Admitting: Neurology

## 2024-07-03 ENCOUNTER — Ambulatory Visit: Payer: Self-pay | Admitting: Neurology

## 2024-07-03 DIAGNOSIS — G43709 Chronic migraine without aura, not intractable, without status migrainosus: Secondary | ICD-10-CM

## 2024-07-03 MED ORDER — ONABOTULINUMTOXINA 100 UNITS IJ SOLR
200.0000 [IU] | Freq: Once | INTRAMUSCULAR | Status: AC
Start: 2024-07-03 — End: 2024-07-03
  Administered 2024-07-03: 155 [IU] via INTRAMUSCULAR

## 2024-07-03 NOTE — Progress Notes (Signed)

## 2024-08-02 DIAGNOSIS — U071 COVID-19: Secondary | ICD-10-CM | POA: Diagnosis not present

## 2024-08-20 ENCOUNTER — Telehealth: Payer: Self-pay | Admitting: Pharmacy Technician

## 2024-08-20 ENCOUNTER — Other Ambulatory Visit (HOSPITAL_COMMUNITY): Payer: Self-pay

## 2024-08-20 NOTE — Telephone Encounter (Signed)
 Pharmacy Patient Advocate Encounter  Received notification from [facility] that Prior Authorization for UBRELVY  100MG  has been APPROVED from 11.13.25 to 11.13.26. Ran test claim, Copay is $0. This test claim was processed through [facility]- copay amounts may vary at other pharmacies due to pharmacy/plan contracts, or as the patient moves through the different stages of their insurance plan.   PA #/Case ID/Reference #: [id number]

## 2024-08-20 NOTE — Telephone Encounter (Signed)
 Pharmacy Patient Advocate Encounter   Received notification from CoverMyMeds that prior authorization for UBRELVY  100MG  is required/requested.   Insurance verification completed.   The patient is insured through [facility].   Per test claim: PA required; PA submitted to above mentioned insurance via Latent Key/confirmation #/EOC [facility] Status is pending

## 2024-10-02 ENCOUNTER — Ambulatory Visit: Admitting: Neurology

## 2024-10-02 DIAGNOSIS — G43709 Chronic migraine without aura, not intractable, without status migrainosus: Secondary | ICD-10-CM | POA: Diagnosis not present

## 2024-10-02 MED ORDER — ONABOTULINUMTOXINA 200 UNITS IJ SOLR
200.0000 [IU] | Freq: Once | INTRAMUSCULAR | Status: AC
  Administered 2024-10-02: 155 [IU] via INTRAMUSCULAR

## 2024-10-02 NOTE — Progress Notes (Signed)

## 2025-01-15 ENCOUNTER — Ambulatory Visit: Payer: Self-pay | Admitting: Neurology

## 2025-04-07 ENCOUNTER — Encounter: Admitting: Family Medicine
